# Patient Record
Sex: Male | Born: 1945 | Race: Black or African American | Hispanic: No | Marital: Single | Smoking: Current every day smoker
Health system: Southern US, Community
[De-identification: ages and names within clinical notes are randomized; demographics above are authoritative.]

## PROBLEM LIST (undated history)

## (undated) DIAGNOSIS — F209 Schizophrenia, unspecified: Secondary | ICD-10-CM

## (undated) DIAGNOSIS — J189 Pneumonia, unspecified organism: Secondary | ICD-10-CM

## (undated) DIAGNOSIS — F419 Anxiety disorder, unspecified: Secondary | ICD-10-CM

## (undated) DIAGNOSIS — I10 Essential (primary) hypertension: Secondary | ICD-10-CM

---

## 2015-01-18 ENCOUNTER — Ambulatory Visit (HOSPITAL_COMMUNITY)
Admission: RE | Admit: 2015-01-18 | Discharge: 2015-01-18 | Disposition: A | Discharge status: Home / Self Care | Payer: Medicare Other | Source: Ambulatory Visit | Attending: Internal Medicine | Admitting: Internal Medicine

## 2015-01-18 ENCOUNTER — Other Ambulatory Visit (HOSPITAL_COMMUNITY): Payer: Self-pay | Admitting: Internal Medicine

## 2015-01-18 DIAGNOSIS — R05 Cough: Secondary | ICD-10-CM | POA: Diagnosis not present

## 2015-01-18 DIAGNOSIS — R079 Chest pain, unspecified: Secondary | ICD-10-CM | POA: Insufficient documentation

## 2015-01-20 ENCOUNTER — Ambulatory Visit (HOSPITAL_COMMUNITY)
Admission: RE | Admit: 2015-01-20 | Discharge: 2015-01-20 | Disposition: A | Discharge status: Home / Self Care | Payer: Medicare Other | Source: Ambulatory Visit | Attending: Internal Medicine | Admitting: Internal Medicine

## 2015-01-20 ENCOUNTER — Other Ambulatory Visit (HOSPITAL_COMMUNITY): Payer: Self-pay | Admitting: Respiratory Therapy

## 2015-01-20 DIAGNOSIS — I10 Essential (primary) hypertension: Secondary | ICD-10-CM | POA: Diagnosis present

## 2015-02-01 ENCOUNTER — Emergency Department (HOSPITAL_COMMUNITY): Payer: Medicare Other

## 2015-02-01 ENCOUNTER — Inpatient Hospital Stay (HOSPITAL_COMMUNITY)
Admission: EM | Admit: 2015-02-01 | Discharge: 2015-02-11 | DRG: 193 | Disposition: A | Discharge status: Home Health | Payer: Medicare Other | Attending: Internal Medicine | Admitting: Internal Medicine

## 2015-02-01 ENCOUNTER — Encounter (HOSPITAL_COMMUNITY): Payer: Self-pay | Admitting: *Deleted

## 2015-02-01 DIAGNOSIS — I272 Other secondary pulmonary hypertension: Secondary | ICD-10-CM | POA: Diagnosis present

## 2015-02-01 DIAGNOSIS — J9622 Acute and chronic respiratory failure with hypercapnia: Secondary | ICD-10-CM | POA: Diagnosis not present

## 2015-02-01 DIAGNOSIS — R778 Other specified abnormalities of plasma proteins: Secondary | ICD-10-CM | POA: Diagnosis present

## 2015-02-01 DIAGNOSIS — Z7289 Other problems related to lifestyle: Secondary | ICD-10-CM | POA: Diagnosis not present

## 2015-02-01 DIAGNOSIS — E46 Unspecified protein-calorie malnutrition: Secondary | ICD-10-CM | POA: Diagnosis not present

## 2015-02-01 DIAGNOSIS — Z880 Allergy status to penicillin: Secondary | ICD-10-CM

## 2015-02-01 DIAGNOSIS — D649 Anemia, unspecified: Secondary | ICD-10-CM | POA: Diagnosis not present

## 2015-02-01 DIAGNOSIS — R7989 Other specified abnormal findings of blood chemistry: Secondary | ICD-10-CM | POA: Diagnosis not present

## 2015-02-01 DIAGNOSIS — T380X5A Adverse effect of glucocorticoids and synthetic analogues, initial encounter: Secondary | ICD-10-CM | POA: Diagnosis present

## 2015-02-01 DIAGNOSIS — I110 Hypertensive heart disease with heart failure: Secondary | ICD-10-CM | POA: Diagnosis not present

## 2015-02-01 DIAGNOSIS — J811 Chronic pulmonary edema: Secondary | ICD-10-CM

## 2015-02-01 DIAGNOSIS — Z720 Tobacco use: Secondary | ICD-10-CM | POA: Diagnosis not present

## 2015-02-01 DIAGNOSIS — I248 Other forms of acute ischemic heart disease: Secondary | ICD-10-CM | POA: Diagnosis present

## 2015-02-01 DIAGNOSIS — F17210 Nicotine dependence, cigarettes, uncomplicated: Secondary | ICD-10-CM | POA: Diagnosis not present

## 2015-02-01 DIAGNOSIS — J9602 Acute respiratory failure with hypercapnia: Secondary | ICD-10-CM | POA: Diagnosis present

## 2015-02-01 DIAGNOSIS — N179 Acute kidney failure, unspecified: Secondary | ICD-10-CM | POA: Diagnosis present

## 2015-02-01 DIAGNOSIS — J9601 Acute respiratory failure with hypoxia: Secondary | ICD-10-CM | POA: Diagnosis not present

## 2015-02-01 DIAGNOSIS — I252 Old myocardial infarction: Secondary | ICD-10-CM

## 2015-02-01 DIAGNOSIS — J810 Acute pulmonary edema: Secondary | ICD-10-CM | POA: Diagnosis present

## 2015-02-01 DIAGNOSIS — Z79899 Other long term (current) drug therapy: Secondary | ICD-10-CM

## 2015-02-01 DIAGNOSIS — E785 Hyperlipidemia, unspecified: Secondary | ICD-10-CM | POA: Diagnosis present

## 2015-02-01 DIAGNOSIS — N170 Acute kidney failure with tubular necrosis: Secondary | ICD-10-CM | POA: Diagnosis not present

## 2015-02-01 DIAGNOSIS — F411 Generalized anxiety disorder: Secondary | ICD-10-CM | POA: Diagnosis present

## 2015-02-01 DIAGNOSIS — I249 Acute ischemic heart disease, unspecified: Secondary | ICD-10-CM

## 2015-02-01 DIAGNOSIS — F1010 Alcohol abuse, uncomplicated: Secondary | ICD-10-CM | POA: Diagnosis not present

## 2015-02-01 DIAGNOSIS — E876 Hypokalemia: Secondary | ICD-10-CM | POA: Diagnosis not present

## 2015-02-01 DIAGNOSIS — Z86718 Personal history of other venous thrombosis and embolism: Secondary | ICD-10-CM | POA: Diagnosis not present

## 2015-02-01 DIAGNOSIS — I5032 Chronic diastolic (congestive) heart failure: Secondary | ICD-10-CM | POA: Diagnosis present

## 2015-02-01 DIAGNOSIS — I5033 Acute on chronic diastolic (congestive) heart failure: Secondary | ICD-10-CM | POA: Diagnosis not present

## 2015-02-01 DIAGNOSIS — J9690 Respiratory failure, unspecified, unspecified whether with hypoxia or hypercapnia: Secondary | ICD-10-CM

## 2015-02-01 DIAGNOSIS — J9621 Acute and chronic respiratory failure with hypoxia: Secondary | ICD-10-CM | POA: Diagnosis not present

## 2015-02-01 DIAGNOSIS — R0689 Other abnormalities of breathing: Secondary | ICD-10-CM | POA: Diagnosis not present

## 2015-02-01 DIAGNOSIS — J189 Pneumonia, unspecified organism: Secondary | ICD-10-CM | POA: Diagnosis not present

## 2015-02-01 DIAGNOSIS — F209 Schizophrenia, unspecified: Secondary | ICD-10-CM | POA: Diagnosis not present

## 2015-02-01 DIAGNOSIS — Z681 Body mass index (BMI) 19 or less, adult: Secondary | ICD-10-CM | POA: Diagnosis not present

## 2015-02-01 DIAGNOSIS — I10 Essential (primary) hypertension: Secondary | ICD-10-CM | POA: Diagnosis present

## 2015-02-01 DIAGNOSIS — R739 Hyperglycemia, unspecified: Secondary | ICD-10-CM | POA: Diagnosis not present

## 2015-02-01 DIAGNOSIS — Z23 Encounter for immunization: Secondary | ICD-10-CM

## 2015-02-01 DIAGNOSIS — J441 Chronic obstructive pulmonary disease with (acute) exacerbation: Secondary | ICD-10-CM | POA: Diagnosis present

## 2015-02-01 DIAGNOSIS — R0902 Hypoxemia: Secondary | ICD-10-CM

## 2015-02-01 HISTORY — DX: Anxiety disorder, unspecified: F41.9

## 2015-02-01 HISTORY — DX: Schizophrenia, unspecified: F20.9

## 2015-02-01 LAB — BLOOD GAS, ARTERIAL
ACID-BASE DEFICIT: 6 mmol/L — AB (ref 0.0–2.0)
Bicarbonate: 19.6 mEq/L — ABNORMAL LOW (ref 20.0–24.0)
DRAWN BY: 23534
O2 Content: 2 L/min
O2 SAT: 90.7 %
pCO2 arterial: 33.4 mmHg — ABNORMAL LOW (ref 35.0–45.0)
pH, Arterial: 7.361 (ref 7.350–7.450)
pO2, Arterial: 63.8 mmHg — ABNORMAL LOW (ref 80.0–100.0)

## 2015-02-01 LAB — CBC WITH DIFFERENTIAL/PLATELET
BASOS PCT: 0 %
Basophils Absolute: 0 10*3/uL (ref 0.0–0.1)
EOS ABS: 0 10*3/uL (ref 0.0–0.7)
EOS PCT: 0 %
HCT: 30.5 % — ABNORMAL LOW (ref 39.0–52.0)
Hemoglobin: 10.4 g/dL — ABNORMAL LOW (ref 13.0–17.0)
LYMPHS ABS: 0.4 10*3/uL — AB (ref 0.7–4.0)
Lymphocytes Relative: 3 %
MCH: 30.4 pg (ref 26.0–34.0)
MCHC: 34.1 g/dL (ref 30.0–36.0)
MCV: 89.2 fL (ref 78.0–100.0)
Monocytes Absolute: 0.6 10*3/uL (ref 0.1–1.0)
Monocytes Relative: 5 %
Neutro Abs: 11.5 10*3/uL — ABNORMAL HIGH (ref 1.7–7.7)
Neutrophils Relative %: 92 %
PLATELETS: 256 10*3/uL (ref 150–400)
RBC: 3.42 MIL/uL — AB (ref 4.22–5.81)
RDW: 12.5 % (ref 11.5–15.5)
WBC: 12.5 10*3/uL — AB (ref 4.0–10.5)

## 2015-02-01 LAB — COMPREHENSIVE METABOLIC PANEL
ALT: 15 U/L — AB (ref 17–63)
ANION GAP: 13 (ref 5–15)
AST: 48 U/L — ABNORMAL HIGH (ref 15–41)
Albumin: 3.2 g/dL — ABNORMAL LOW (ref 3.5–5.0)
Alkaline Phosphatase: 63 U/L (ref 38–126)
BUN: 47 mg/dL — ABNORMAL HIGH (ref 6–20)
CHLORIDE: 106 mmol/L (ref 101–111)
CO2: 19 mmol/L — AB (ref 22–32)
CREATININE: 2.28 mg/dL — AB (ref 0.61–1.24)
Calcium: 8.7 mg/dL — ABNORMAL LOW (ref 8.9–10.3)
GFR calc non Af Amer: 28 mL/min — ABNORMAL LOW (ref >60)
GFR, EST AFRICAN AMERICAN: 32 mL/min — AB (ref >60)
Glucose, Bld: 136 mg/dL — ABNORMAL HIGH (ref 65–99)
Potassium: 3.6 mmol/L (ref 3.5–5.1)
Sodium: 138 mmol/L (ref 135–145)
Total Bilirubin: 0.8 mg/dL (ref 0.3–1.2)
Total Protein: 6.9 g/dL (ref 6.5–8.1)

## 2015-02-01 LAB — BRAIN NATRIURETIC PEPTIDE: B NATRIURETIC PEPTIDE 5: 222 pg/mL — AB (ref 0.0–100.0)

## 2015-02-01 LAB — TROPONIN I
TROPONIN I: 0.06 ng/mL — AB (ref <0.031)
Troponin I: 0.07 ng/mL — ABNORMAL HIGH (ref <0.031)

## 2015-02-01 LAB — I-STAT CG4 LACTIC ACID, ED: LACTIC ACID, VENOUS: 1.37 mmol/L (ref 0.5–2.0)

## 2015-02-01 MED ORDER — AMANTADINE HCL 100 MG PO CAPS
100.0000 mg | ORAL_CAPSULE | Freq: Three times a day (TID) | ORAL | Status: DC
  Administered 2015-02-01 – 2015-02-02 (×4): 100 mg via ORAL
  Filled 2015-02-01 (×11): qty 1

## 2015-02-01 MED ORDER — METHYLPREDNISOLONE SODIUM SUCC 125 MG IJ SOLR
60.0000 mg | Freq: Two times a day (BID) | INTRAMUSCULAR | Status: DC
  Administered 2015-02-02 – 2015-02-03 (×3): 60 mg via INTRAVENOUS
  Filled 2015-02-01 (×3): qty 2

## 2015-02-01 MED ORDER — DEXTROSE 5 % IV SOLN
1.0000 g | INTRAVENOUS | Status: DC
  Administered 2015-02-01 – 2015-02-02 (×2): 1 g via INTRAVENOUS
  Filled 2015-02-01 (×2): qty 10

## 2015-02-01 MED ORDER — SODIUM CHLORIDE 0.9 % IV BOLUS (SEPSIS)
1000.0000 mL | Freq: Once | INTRAVENOUS | Status: AC
  Administered 2015-02-01: 1000 mL via INTRAVENOUS

## 2015-02-01 MED ORDER — SODIUM CHLORIDE 0.9 % IV SOLN: INTRAVENOUS | Status: AC

## 2015-02-01 MED ORDER — ALBUTEROL (5 MG/ML) CONTINUOUS INHALATION SOLN
10.0000 mg/h | INHALATION_SOLUTION | Freq: Once | RESPIRATORY_TRACT | Status: AC
  Administered 2015-02-01: 10 mg/h via RESPIRATORY_TRACT
  Filled 2015-02-01: qty 20

## 2015-02-01 MED ORDER — DEXTROSE 5 % IV SOLN
500.0000 mg | INTRAVENOUS | Status: DC
  Administered 2015-02-01 – 2015-02-02 (×2): 500 mg via INTRAVENOUS
  Filled 2015-02-01 (×2): qty 500

## 2015-02-01 MED ORDER — ATORVASTATIN CALCIUM 10 MG PO TABS
10.0000 mg | ORAL_TABLET | Freq: Every day | ORAL | Status: DC
  Administered 2015-02-02: 10 mg via ORAL
  Filled 2015-02-01 (×2): qty 1

## 2015-02-01 MED ORDER — IPRATROPIUM-ALBUTEROL 0.5-2.5 (3) MG/3ML IN SOLN
3.0000 mL | Freq: Four times a day (QID) | RESPIRATORY_TRACT | Status: DC
  Administered 2015-02-01 – 2015-02-03 (×7): 3 mL via RESPIRATORY_TRACT
  Filled 2015-02-01 (×7): qty 3

## 2015-02-01 MED ORDER — ENOXAPARIN SODIUM 30 MG/0.3ML ~~LOC~~ SOLN
30.0000 mg | SUBCUTANEOUS | Status: DC
  Administered 2015-02-01 – 2015-02-02 (×2): 30 mg via SUBCUTANEOUS
  Filled 2015-02-01 (×3): qty 0.3

## 2015-02-01 MED ORDER — AMANTADINE HCL 100 MG PO CAPS
ORAL_CAPSULE | ORAL | Status: AC
  Filled 2015-02-01: qty 1

## 2015-02-01 MED ORDER — METHYLPREDNISOLONE SODIUM SUCC 125 MG IJ SOLR
125.0000 mg | Freq: Once | INTRAMUSCULAR | Status: AC
  Administered 2015-02-01: 125 mg via INTRAVENOUS
  Filled 2015-02-01: qty 2

## 2015-02-01 MED ORDER — ALBUTEROL SULFATE (2.5 MG/3ML) 0.083% IN NEBU
2.5000 mg | INHALATION_SOLUTION | RESPIRATORY_TRACT | Status: DC | PRN
  Administered 2015-02-02: 2.5 mg via RESPIRATORY_TRACT
  Filled 2015-02-01: qty 3

## 2015-02-01 NOTE — ED Notes (Signed)
Attempted to call report, RN unavailable at this time.

## 2015-02-01 NOTE — ED Notes (Signed)
MD at bedside. 

## 2015-02-01 NOTE — H&P (Signed)
History and Physical  Jared Austin AOZ:[id number] DOB: 02/08/1946 DOA: 02/01/2015  Referring physician: Dr Blinda Leatherwood, ED physician PCP: Avon Gully, MD   Chief Complaint: Shortness of breath  HPI: Jared Austin is a 69 y.o. male  With a history of schizophrenia on amantadine and invega. The patient presents to the hospital with shortness of breath that a been increasing over the past 4-6 weeks and acutely worse over the past 48 hours. Shortness of breath worse with ambulation and improved with rest. Patient denies fevers, chills, decreased appetite, cough - although history is somewhat difficult to obtain due to patient's psych history. Per the ER physician, chest sounds were tight with the patient first presented to the hospital and was hypoxic to 86%.    Review of Systems:   Pt denies any fevers, chills, nausea, vomiting, diarrhea, constipation, abdominal pain, orthopnea, cough, wheezing, palpitations, headache, vision changes, lightheadedness, dizziness, diarrhea, constipation, melena, rectal bleeding.  Review of systems are otherwise negative  Past Medical History  Diagnosis Date  . Anxiety   . Schizophrenia (HCC)    History reviewed. No pertinent past surgical history. Social History:  reports that he has been smoking Cigarettes.  He has a 93 pack-year smoking history. He does not have any smokeless tobacco history on file. He reports that he does not drink alcohol or use illicit drugs. Patient lives at Home & is able to participate in activities of daily living with assistance   Allergies  Allergen Reactions  . Penicillins Anxiety    Patient unaware of family history   Prior to Admission medications   Medication Sig Start Date End Date Taking? Authorizing Provider  amantadine (SYMMETREL) 100 MG capsule Take 100 mg by mouth 3 (three) times daily. 01/21/15  Yes Historical Provider, MD  Aspirin-Salicylamide-Caffeine (BC HEADACHE POWDER PO) Take 1 packet by mouth every 6 (six)  hours as needed (Pain).   Yes Historical Provider, MD  atorvastatin (LIPITOR) 10 MG tablet Take 10 mg by mouth daily. 01/08/15  Yes Historical Provider, MD  INVEGA SUSTENNA 156 MG/ML SUSP injection Inject 156 mg as directed every 6 (six) weeks.  01/21/15  Yes Historical Provider, MD    Physical Exam: BP 149/86 mmHg  Pulse 106  Temp(Src) 98.6 F (37 C) (Oral)  Resp 24  Ht  (1.702 m)  Wt 59.421 kg (131 lb)  BMI 20.51 kg/m2  SpO2 94%  General: older black male. Awake and alert and oriented x3. No acute cardiopulmonary distress.  Eyes: Pupils equal, round, reactive to light. Extraocular muscles are intact. Sclerae anicteric and noninjected.  ENT:  Moist mucosal membranes. No mucosal lesions.   Neck: Neck supple without lymphadenopathy. No carotid bruits. No masses palpated.  Cardiovascular: Regular rate with normal S1-S2 sounds. No murmurs, rubs, gallops auscultated. No JVD.  Respiratory: prolonged expiration phase. Mild wheezes noted. Rales noted in the left base  Abdomen: Soft, nontender, nondistended. Active bowel sounds. No masses or hepatosplenomegaly  Skin: Dry, warm to touch. 2+ dorsalis pedis and radial pulses. Musculoskeletal: No calf or leg pain. All major joints not erythematous nontender.  Psychiatric: Intact judgment and insight.  Neurologic: No focal neurological deficits. Cranial nerves II through XII are grossly intact.           Labs on Admission:  Basic Metabolic Panel:  Recent Labs Lab 02/01/15 1540  NA 138  K 3.6  CL 106  CO2 19*  GLUCOSE 136*  BUN 47*  CREATININE 2.28*  CALCIUM 8.7*   Liver Function Tests:  Recent  Labs Lab 02/01/15 1540  AST 48*  ALT 15*  ALKPHOS 63  BILITOT 0.8  PROT 6.9  ALBUMIN 3.2*   No results for input(s): LIPASE, AMYLASE in the last 168 hours. No results for input(s): AMMONIA in the last 168 hours. CBC:  Recent Labs Lab 02/01/15 1540  WBC 12.5*  NEUTROABS 11.5*  HGB 10.4*  HCT 30.5*  MCV 89.2  PLT 256    Cardiac Enzymes:  Recent Labs Lab 02/01/15 1540  TROPONINI 0.07*    BNP (last 3 results)  Recent Labs  02/01/15 1530  BNP 222.0*    ProBNP (last 3 results) No results for input(s): PROBNP in the last 8760 hours.  CBG: No results for input(s): GLUCAP in the last 168 hours.  Radiological Exams on Admission: Dg Chest 2 View  02/01/2015   CLINICAL DATA:  Cough and short of breath  EXAM: CHEST  2 VIEW  COMPARISON:  01/18/2015  FINDINGS: Interval development of prominent reticulonodular densities throughout both lungs. No effusion. No lobar infiltrate. Mild hyperinflation of the lungs  Heart size is normal.  Vascularity normal.  IMPRESSION: Interval development of reticular nodular markings in the lungs right greater than left. This may be due to acute infection or inhalational lung disease. Consider virus or mycoplasma pneumonia.   Electronically Signed   By: Marlan Palau M.D.   On: 02/01/2015 16:07    EKG: Independently reviewed.  Sinus tachycardia with ventricular rate of 103. normal intervals. Possible left ventricular hypertrophy. Right atrial hypertrophy. No acute STEMI  Assessment/Plan Present on Admission:  . CAP (community acquired pneumonia) . Acute respiratory failure with hypoxia (HCC) . Elevated troponin . Acute kidney failure [facility])  This patient was discussed with the ED physician, including pertinent vitals, physical exam findings, labs, and imaging.  We also discussed care given by the ED provider.   #1 acute respiratory failure with hypoxia  admit   improved with nasal cannula   Continue oxygen therapy #2 community-acquired acquired pneumonia    possibly atypical   We'll obtain HIV, Legionella, strep antigens new    Continue ceftriaxone and azithromycin   obtain CBC in the morning #3  Acute kidney failure   we'll give 1 L fluid bolus in the emergency department over 4 hours,  Then continue is 125 miles per hour for 10 hours   recheck  creatinine in the morning  #4 elevated troponin   repeat troponin 3 hours from last. If continues to be elevated, we'll continue to trend. This may simply be from acute kidney failure  DVT prophylaxis:  Lovenox  Consultants:  none  Code Status:  Full code  Family Communication:  none   Disposition Plan:  admission   Levie Heritage, DO [facility] Pager [phone]

## 2015-02-01 NOTE — ED Notes (Signed)
Pt states has been having trouble breathing over the past 2 months and has been to his primary for these issues but he got more SOB today and comes in for evaluation. NAD noted.

## 2015-02-01 NOTE — ED Provider Notes (Signed)
CSN: [id number]     Arrival date & time 02/01/15  1501 History   First MD Initiated Contact with Patient 02/01/15 1520     Chief Complaint  Patient presents with  . Shortness of Breath     (Consider location/radiation/quality/duration/timing/severity/associated sxs/prior Treatment) HPI Comments: Patient presents to the emergency department for evaluation of difficulty breathing. Patient reports that he has been having difficulty breathing for approximately 2 months. He did see his doctor last week and had blood work and x-rays, but did not hear back yet. Patient reports that his breathing is worse. Family also reports that he seems to be more confused than usual. Patient not expressing any chest pain associated with the symptoms. He has not had any fever. There is a cough productive of thick sputum. He is a heavy smoker. He does not use oxygen or albuterol at home.  Patient is a 69 y.o. male presenting with shortness of breath.  Shortness of Breath Associated symptoms: cough     Past Medical History  Diagnosis Date  . Anxiety   . Schizophrenia (HCC)    History reviewed. No pertinent past surgical history. No family history on file. Social History  Substance Use Topics  . Smoking status: Current Every Day Smoker -- 1.50 packs/day for 62 years    Types: Cigarettes  . Smokeless tobacco: None  . Alcohol Use: No    Review of Systems  Respiratory: Positive for cough and shortness of breath.   Psychiatric/Behavioral: Positive for confusion.  All other systems reviewed and are negative.     Allergies  Penicillins  Home Medications   Prior to Admission medications   Medication Sig Start Date End Date Taking? Authorizing Provider  amantadine (SYMMETREL) 100 MG capsule Take 100 mg by mouth 3 (three) times daily. 01/21/15  Yes Historical Provider, MD  Aspirin-Salicylamide-Caffeine (BC HEADACHE POWDER PO) Take 1 packet by mouth every 6 (six) hours as needed (Pain).   Yes Historical  Provider, MD  atorvastatin (LIPITOR) 10 MG tablet Take 10 mg by mouth daily. 01/08/15  Yes Historical Provider, MD  INVEGA SUSTENNA 156 MG/ML SUSP injection Inject 156 mg as directed every 6 (six) weeks.  01/21/15  Yes Historical Provider, MD   BP 144/81 mmHg  Pulse 101  Temp(Src) 98.7 F (37.1 C) (Oral)  Resp 26  Ht  (1.702 m)  Wt 131 lb (59.421 kg)  BMI 20.51 kg/m2  SpO2 93% Physical Exam  Constitutional: He is oriented to person, place, and time. He appears well-developed and well-nourished. No distress.  HENT:  Head: Normocephalic and atraumatic.  Right Ear: Hearing normal.  Left Ear: Hearing normal.  Nose: Nose normal.  Mouth/Throat: Oropharynx is clear and moist and mucous membranes are normal.  Eyes: Conjunctivae and EOM are normal. Pupils are equal, round, and reactive to light.  Neck: Normal range of motion. Neck supple.  Cardiovascular: Regular rhythm, S1 normal and S2 normal.  Tachycardia present.  Exam reveals no gallop and no friction rub.   No murmur heard. Pulmonary/Chest: Effort normal. No respiratory distress. He has decreased breath sounds in the right middle field, the right lower field, the left middle field and the left lower field. He exhibits no tenderness.  Abdominal: Soft. Normal appearance and bowel sounds are normal. There is no hepatosplenomegaly. There is no tenderness. There is no rebound, no guarding, no tenderness at McBurney's point and negative Murphy's sign. No hernia.  Musculoskeletal: Normal range of motion.  Neurological: He is alert and oriented to person,  place, and time. He has normal strength. No cranial nerve deficit or sensory deficit. Coordination normal. GCS eye subscore is 4. GCS verbal subscore is 5. GCS motor subscore is 6.  Skin: Skin is warm, dry and intact. No rash noted. No cyanosis.  Psychiatric: He has a normal mood and affect. His speech is normal and behavior is normal. Thought content normal.  Nursing note and vitals  reviewed.   ED Course  Procedures (including critical care time) Labs Review Labs Reviewed  CBC WITH DIFFERENTIAL/PLATELET - Abnormal; Notable for the following:    WBC 12.5 (*)    RBC 3.42 (*)    Hemoglobin 10.4 (*)    HCT 30.5 (*)    Neutro Abs 11.5 (*)    Lymphs Abs 0.4 (*)    All other components within normal limits  COMPREHENSIVE METABOLIC PANEL - Abnormal; Notable for the following:    CO2 19 (*)    Glucose, Bld 136 (*)    BUN 47 (*)    Creatinine, Ser 2.28 (*)    Calcium 8.7 (*)    Albumin 3.2 (*)    AST 48 (*)    ALT 15 (*)    GFR calc non Af Amer 28 (*)    GFR calc Af Amer 32 (*)    All other components within normal limits  TROPONIN I - Abnormal; Notable for the following:    Troponin I 0.07 (*)    All other components within normal limits  BRAIN NATRIURETIC PEPTIDE - Abnormal; Notable for the following:    B Natriuretic Peptide 222.0 (*)    All other components within normal limits  BLOOD GAS, ARTERIAL - Abnormal; Notable for the following:    pCO2 arterial 33.4 (*)    pO2, Arterial 63.8 (*)    Bicarbonate 19.6 (*)    Acid-base deficit 6.0 (*)    All other components within normal limits  I-STAT CG4 LACTIC ACID, ED    Imaging Review Dg Chest 2 View  02/01/2015   CLINICAL DATA:  Cough and short of breath  EXAM: CHEST  2 VIEW  COMPARISON:  01/18/2015  FINDINGS: Interval development of prominent reticulonodular densities throughout both lungs. No effusion. No lobar infiltrate. Mild hyperinflation of the lungs  Heart size is normal.  Vascularity normal.  IMPRESSION: Interval development of reticular nodular markings in the lungs right greater than left. This may be due to acute infection or inhalational lung disease. Consider virus or mycoplasma pneumonia.   Electronically Signed   By: Marlan Palau M.D.   On: 02/01/2015 16:07   I have personally reviewed and evaluated these images and lab results as part of my medical decision-making.   EKG  Interpretation   Date/Time:  Saturday February 01 2015 15:16:55 EDT Ventricular Rate:  103 PR Interval:  151 QRS Duration: 88 QT Interval:  374 QTC Calculation: 490 R Axis:   70 Text Interpretation:  Sinus tachycardia Right atrial enlargement Consider  left ventricular hypertrophy Borderline prolonged QT interval Baseline  wander in lead(s) V2 No significant change since last tracing Confirmed by  POLLINA  MD, CHRISTOPHER [phone]) on 02/01/2015 3:21:05 PM      MDM   Final diagnoses:  CAP (community acquired pneumonia)    Patient presents to the ER for evaluation of shortness of breath. Patient reports that he has been experiencing shortness of breath the last couple of months, but symptoms have worsened in the last couple of days. They report that he does have some  increased confusion, but he does have a history of schizophrenia. Patient is alert and in no distress here in the ER. Patient has a long smoking history, is not currently treated for COPD.  Workup shows evidence of pneumonia, possibly atypical such as mycoplasma pneumonia. Patient was hypoxic at arrival. He will require hospitalization for further treatment. Blood gas does not show any CO2 retention. Patient initiated on Rocephin and Zithromax for community-acquired pneumonia and atypical coverage.    Gilda Crease, MD 02/01/15 1650

## 2015-02-01 NOTE — ED Notes (Signed)
Pt c/o SOB over the last couple months. Pt was seen by Dr. Felecia Shelling earlier this week and had xrays and blood work done. Pt and family have not heard any results from Dr. Felecia Shelling yet. Pt's brother and niece at bedside reporting pt has been having increased difficulty breathing recently. Pt seems slightly confused, very talkative, not always able to carry on an understandable conversation. Pt alert and oriented to self and place, but disoriented to time. Pt's niece and brother report pt has hx of schizophrenia and is always slightly confused and anxious, but over the last 4 weeks it has been much more than normal. Pt lives at home with brother.

## 2015-02-01 NOTE — ED Notes (Signed)
Hospitalist at bedside 

## 2015-02-02 LAB — CBC
HEMATOCRIT: 30.9 % — AB (ref 39.0–52.0)
Hemoglobin: 10.5 g/dL — ABNORMAL LOW (ref 13.0–17.0)
MCH: 30.4 pg (ref 26.0–34.0)
MCHC: 34 g/dL (ref 30.0–36.0)
MCV: 89.6 fL (ref 78.0–100.0)
PLATELETS: 226 10*3/uL (ref 150–400)
RBC: 3.45 MIL/uL — ABNORMAL LOW (ref 4.22–5.81)
RDW: 12.7 % (ref 11.5–15.5)
WBC: 10.8 10*3/uL — ABNORMAL HIGH (ref 4.0–10.5)

## 2015-02-02 LAB — BASIC METABOLIC PANEL
ANION GAP: 11 (ref 5–15)
BUN: 34 mg/dL — AB (ref 6–20)
CALCIUM: 8.9 mg/dL (ref 8.9–10.3)
CO2: 20 mmol/L — AB (ref 22–32)
Chloride: 108 mmol/L (ref 101–111)
Creatinine, Ser: 1.8 mg/dL — ABNORMAL HIGH (ref 0.61–1.24)
GFR calc Af Amer: 43 mL/min — ABNORMAL LOW (ref >60)
GFR, EST NON AFRICAN AMERICAN: 37 mL/min — AB (ref >60)
GLUCOSE: 144 mg/dL — AB (ref 65–99)
Potassium: 3.1 mmol/L — ABNORMAL LOW (ref 3.5–5.1)
Sodium: 139 mmol/L (ref 135–145)

## 2015-02-02 MED ORDER — NICOTINE 21 MG/24HR TD PT24
21.0000 mg | MEDICATED_PATCH | Freq: Every day | TRANSDERMAL | Status: DC
  Administered 2015-02-02 – 2015-02-03 (×2): 21 mg via TRANSDERMAL
  Filled 2015-02-02 (×2): qty 1

## 2015-02-02 MED ORDER — ACETAMINOPHEN 325 MG PO TABS
650.0000 mg | ORAL_TABLET | Freq: Four times a day (QID) | ORAL | Status: DC | PRN
  Administered 2015-02-02: 650 mg via ORAL
  Filled 2015-02-02: qty 2

## 2015-02-02 NOTE — Progress Notes (Signed)
Patient beginning to hear voices. He is Schizophrenic

## 2015-02-02 NOTE — Progress Notes (Signed)
ANTIBIOTIC CONSULT NOTE - INITIAL  Pharmacy Consult for Renal Adjustment Antibiotics (Azithromycin/Ceftriaxone) Indication: pneumonia  Allergies  Allergen Reactions  . Penicillins Anxiety    Patient Measurements: Height:  (170.2 cm) Weight: 114 lb 9.6 oz (51.982 kg) IBW/kg (Calculated) : 66.1 Adjusted Body Weight:   Vital Signs: Temp: 99.4 F (37.4 C) (10/02 0500) Temp Source: Oral (10/02 0500) BP: 160/90 mmHg (10/02 0600) Pulse Rate: 108 (10/02 0500) Intake/Output from previous day: 10/01 0701 - 10/02 0700 In: 1000 [IV Piggyback:1000] Out: -  Intake/Output from this shift:    Labs:  Recent Labs  02/01/15 1540 02/02/15 0608  WBC 12.5* 10.8*  HGB 10.4* 10.5*  PLT 256 226  CREATININE 2.28* 1.80*   Estimated Creatinine Clearance: 28.9 mL/min (by C-G formula based on Cr of 1.8). No results for input(s): VANCOTROUGH, VANCOPEAK, VANCORANDOM, GENTTROUGH, GENTPEAK, GENTRANDOM, TOBRATROUGH, TOBRAPEAK, TOBRARND, AMIKACINPEAK, AMIKACINTROU, AMIKACIN in the last 72 hours.   Microbiology: No results found for this or any previous visit (from the past 720 hour(s)).  Medical History: Past Medical History  Diagnosis Date  . Anxiety   . Schizophrenia (HCC)     Medications:  Scheduled:  . amantadine  100 mg Oral TID  . atorvastatin  10 mg Oral q1800  . azithromycin  500 mg Intravenous Q24H  . cefTRIAXone (ROCEPHIN)  IV  1 g Intravenous Q24H  . enoxaparin (LOVENOX) injection  30 mg Subcutaneous Q24H  . ipratropium-albuterol  3 mL Nebulization Q6H  . methylPREDNISolone (SOLU-MEDROL) injection  60 mg Intravenous Q12H   Assessment: Community acquired pneumonia Azithromycin 500 mg IV and Ceftriaxone 1 GM IV started  Goal of Therapy:  Eradicate infection  Plan:  Continue Azithromycin 500 mg IV every 24 hours Continue Ceftriaxone 1 GM IV every 24 hours No renal adjustment necessary F/U Azithromycin to oral therapy when appropriate  Mujtaba Bollig  Bennett 02/02/2015,8:46 AM

## 2015-02-02 NOTE — Progress Notes (Signed)
Called to patient's room by RN due to low oxygen level.  Patient had been on 3-4 lpm most of the day, saturation was 84% with slight SOB.  Gave patient albuterol PRN TX and placed patient on 50% venturi mask, saturation now is 98%.  Patient is doing a lot of mouth breathing.  Will continue to monitor.

## 2015-02-02 NOTE — Progress Notes (Signed)
Was informed patient was a 3 ppd smoker.  Received verbal order from Dr. Karilyn Cota for  nicotine patch.

## 2015-02-02 NOTE — Progress Notes (Signed)
NT notified RN that patient's O2 sats were 84% 3L [place] during vital sign assessment.  Patient's O2 bumped up to 4L, scheduled solumedrol administered, and HOB raised.  O2 sats came up to 89-90%.  RT notified.  RT stated she would come see patient.  WIll continue to monitor patient.

## 2015-02-03 ENCOUNTER — Inpatient Hospital Stay (HOSPITAL_COMMUNITY): Payer: Medicare Other

## 2015-02-03 ENCOUNTER — Inpatient Hospital Stay (HOSPITAL_COMMUNITY): Payer: Medicare Other | Admitting: Anesthesiology

## 2015-02-03 DIAGNOSIS — J9601 Acute respiratory failure with hypoxia: Secondary | ICD-10-CM

## 2015-02-03 DIAGNOSIS — J189 Pneumonia, unspecified organism: Principal | ICD-10-CM

## 2015-02-03 LAB — BLOOD GAS, ARTERIAL
ACID-BASE DEFICIT: 1.2 mmol/L (ref 0.0–2.0)
Acid-base deficit: 3.1 mmol/L — ABNORMAL HIGH (ref 0.0–2.0)
Acid-base deficit: 2 mmol/L (ref 0.0–2.0)
Bicarbonate: 21.9 mEq/L (ref 20.0–24.0)
Bicarbonate: 23.3 mEq/L (ref 20.0–24.0)
Bicarbonate: 22.2 mEq/L (ref 20.0–24.0)
DRAWN BY: 23534
DRAWN BY: 23534
Drawn by: 105551
FIO2: 100
FIO2: 0.9
LHR: 22 {breaths}/min
MECHVT: 410 mL
O2 CONTENT: 90 L/min
O2 Content: 100 L/min
O2 Saturation: 94.9 %
O2 Saturation: 90.2 %
O2 Saturation: 99.7 %
PATIENT TEMPERATURE: 98.6
PCO2 ART: 36.5 mmHg (ref 35.0–45.0)
PCO2 ART: 52.6 mmHg — AB (ref 35.0–45.0)
PEEP/CPAP: 14 cmH2O
PH ART: 7.38 (ref 7.350–7.450)
pCO2 arterial: 40.1 mmHg (ref 35.0–45.0)
pH, Arterial: 7.38 (ref 7.350–7.450)
pH, Arterial: 7.278 — ABNORMAL LOW (ref 7.350–7.450)
pO2, Arterial: 81.7 mmHg (ref 80.0–100.0)
pO2, Arterial: 64.3 mmHg — ABNORMAL LOW (ref 80.0–100.0)
pO2, Arterial: 367 mmHg — ABNORMAL HIGH (ref 80.0–100.0)

## 2015-02-03 LAB — MRSA PCR SCREENING
MRSA BY PCR: NEGATIVE
MRSA by PCR: NEGATIVE

## 2015-02-03 LAB — COMPREHENSIVE METABOLIC PANEL
ALBUMIN: 3 g/dL — AB (ref 3.5–5.0)
ALT: 18 U/L (ref 17–63)
ANION GAP: 10 (ref 5–15)
AST: 34 U/L (ref 15–41)
Alkaline Phosphatase: 75 U/L (ref 38–126)
BILIRUBIN TOTAL: 0.7 mg/dL (ref 0.3–1.2)
BUN: 41 mg/dL — ABNORMAL HIGH (ref 6–20)
CHLORIDE: 106 mmol/L (ref 101–111)
CO2: 22 mmol/L (ref 22–32)
Calcium: 9.1 mg/dL (ref 8.9–10.3)
Creatinine, Ser: 2 mg/dL — ABNORMAL HIGH (ref 0.61–1.24)
GFR calc Af Amer: 38 mL/min — ABNORMAL LOW (ref >60)
GFR calc non Af Amer: 33 mL/min — ABNORMAL LOW (ref >60)
GLUCOSE: 159 mg/dL — AB (ref 65–99)
POTASSIUM: 3.4 mmol/L — AB (ref 3.5–5.1)
SODIUM: 138 mmol/L (ref 135–145)
TOTAL PROTEIN: 6.9 g/dL (ref 6.5–8.1)

## 2015-02-03 LAB — CBC WITH DIFFERENTIAL/PLATELET
BASOS ABS: 0 10*3/uL (ref 0.0–0.1)
BASOS PCT: 0 %
EOS ABS: 0 10*3/uL (ref 0.0–0.7)
EOS PCT: 0 %
HCT: 34 % — ABNORMAL LOW (ref 39.0–52.0)
Hemoglobin: 11.6 g/dL — ABNORMAL LOW (ref 13.0–17.0)
LYMPHS ABS: 0.5 10*3/uL — AB (ref 0.7–4.0)
Lymphocytes Relative: 3 %
MCH: 30.9 pg (ref 26.0–34.0)
MCHC: 34.1 g/dL (ref 30.0–36.0)
MCV: 90.7 fL (ref 78.0–100.0)
MONOS PCT: 2 %
Monocytes Absolute: 0.4 10*3/uL (ref 0.1–1.0)
NEUTROS ABS: 15.8 10*3/uL — AB (ref 1.7–7.7)
NEUTROS PCT: 95 %
PLATELETS: 228 10*3/uL (ref 150–400)
RBC: 3.75 MIL/uL — AB (ref 4.22–5.81)
RDW: 13.1 % (ref 11.5–15.5)
WBC: 16.7 10*3/uL — ABNORMAL HIGH (ref 4.0–10.5)

## 2015-02-03 LAB — PHOSPHORUS: Phosphorus: 5.5 mg/dL — ABNORMAL HIGH (ref 2.5–4.6)

## 2015-02-03 LAB — POCT I-STAT 3, ART BLOOD GAS (G3+)
ACID-BASE EXCESS: 1 mmol/L (ref 0.0–2.0)
Bicarbonate: 26.1 mEq/L — ABNORMAL HIGH (ref 20.0–24.0)
O2 Saturation: 98 %
PH ART: 7.375 (ref 7.350–7.450)
PO2 ART: 111 mmHg — AB (ref 80.0–100.0)
TCO2: 27 mmol/L (ref 0–100)
pCO2 arterial: 44.6 mmHg (ref 35.0–45.0)

## 2015-02-03 LAB — MAGNESIUM: MAGNESIUM: 2.4 mg/dL (ref 1.7–2.4)

## 2015-02-03 LAB — TROPONIN I: Troponin I: 0.2 ng/mL — ABNORMAL HIGH (ref <0.031)

## 2015-02-03 LAB — STREP PNEUMONIAE URINARY ANTIGEN: Strep Pneumo Urinary Antigen: NEGATIVE

## 2015-02-03 LAB — AMMONIA: Ammonia: 29 umol/L (ref 9–35)

## 2015-02-03 LAB — BRAIN NATRIURETIC PEPTIDE: B Natriuretic Peptide: 602 pg/mL — ABNORMAL HIGH (ref 0.0–100.0)

## 2015-02-03 LAB — GLUCOSE, CAPILLARY: GLUCOSE-CAPILLARY: 152 mg/dL — AB (ref 65–99)

## 2015-02-03 MED ORDER — HALOPERIDOL LACTATE 5 MG/ML IJ SOLN
1.0000 mg | INTRAMUSCULAR | Status: DC | PRN
  Administered 2015-02-03 (×2): 2 mg via INTRAVENOUS
  Filled 2015-02-03 (×2): qty 1

## 2015-02-03 MED ORDER — FENTANYL CITRATE (PF) 100 MCG/2ML IJ SOLN
50.0000 ug | INTRAMUSCULAR | Status: DC | PRN
  Administered 2015-02-03 (×2): 50 ug via INTRAVENOUS
  Filled 2015-02-03 (×2): qty 2

## 2015-02-03 MED ORDER — SUCCINYLCHOLINE CHLORIDE 20 MG/ML IJ SOLN
INTRAMUSCULAR | Status: AC
  Administered 2015-02-03: 80 mg via INTRAVENOUS
  Filled 2015-02-03: qty 1

## 2015-02-03 MED ORDER — VANCOMYCIN HCL IN DEXTROSE 1-5 GM/200ML-% IV SOLN
1000.0000 mg | Freq: Once | INTRAVENOUS | Status: AC
  Administered 2015-02-03: 1000 mg via INTRAVENOUS
  Filled 2015-02-03: qty 200

## 2015-02-03 MED ORDER — ANTISEPTIC ORAL RINSE SOLUTION (CORINZ)
7.0000 mL | OROMUCOSAL | Status: DC
  Administered 2015-02-03 (×2): 7 mL via OROMUCOSAL

## 2015-02-03 MED ORDER — DEXTROSE 5 % IV SOLN
INTRAVENOUS | Status: DC
  Administered 2015-02-03: 10:00:00 via INTRAVENOUS

## 2015-02-03 MED ORDER — ROCURONIUM BROMIDE 50 MG/5ML IV SOLN
INTRAVENOUS | Status: AC
  Filled 2015-02-03: qty 2

## 2015-02-03 MED ORDER — ACETAMINOPHEN 160 MG/5ML PO SOLN: 650.0000 mg | Freq: Four times a day (QID) | ORAL | Status: DC | PRN

## 2015-02-03 MED ORDER — FENTANYL CITRATE (PF) 100 MCG/2ML IJ SOLN
50.0000 ug | INTRAMUSCULAR | Status: DC | PRN
  Filled 2015-02-03: qty 2

## 2015-02-03 MED ORDER — ETOMIDATE 2 MG/ML IV SOLN
INTRAVENOUS | Status: AC
  Administered 2015-02-03: 14 mg via INTRAVENOUS
  Filled 2015-02-03: qty 20

## 2015-02-03 MED ORDER — IPRATROPIUM-ALBUTEROL 0.5-2.5 (3) MG/3ML IN SOLN
3.0000 mL | Freq: Four times a day (QID) | RESPIRATORY_TRACT | Status: DC
  Administered 2015-02-03 – 2015-02-05 (×9): 3 mL via RESPIRATORY_TRACT
  Filled 2015-02-03 (×9): qty 3

## 2015-02-03 MED ORDER — SODIUM CHLORIDE 0.9 % IV SOLN
INTRAVENOUS | Status: DC
  Administered 2015-02-03 – 2015-02-10 (×3): via INTRAVENOUS

## 2015-02-03 MED ORDER — LORAZEPAM 1 MG PO TABS: 1.0000 mg | ORAL_TABLET | Freq: Four times a day (QID) | ORAL | Status: DC | PRN

## 2015-02-03 MED ORDER — PANTOPRAZOLE SODIUM 40 MG PO PACK
40.0000 mg | PACK | ORAL | Status: DC
  Filled 2015-02-03: qty 20

## 2015-02-03 MED ORDER — DEXMEDETOMIDINE HCL IN NACL 200 MCG/50ML IV SOLN
0.0000 ug/kg/h | INTRAVENOUS | Status: DC
Start: 2015-02-03 — End: 2015-02-03
  Administered 2015-02-03: 0.6 ug/kg/h via INTRAVENOUS

## 2015-02-03 MED ORDER — VITAL HIGH PROTEIN PO LIQD
1000.0000 mL | ORAL | Status: DC
  Administered 2015-02-03: 23:00:00
  Administered 2015-02-03: 1000 mL
  Filled 2015-02-03 (×3): qty 1000

## 2015-02-03 MED ORDER — MORPHINE SULFATE (PF) 2 MG/ML IV SOLN: 2.0000 mg | INTRAVENOUS | Status: DC | PRN

## 2015-02-03 MED ORDER — ANTISEPTIC ORAL RINSE SOLUTION (CORINZ)
7.0000 mL | Freq: Four times a day (QID) | OROMUCOSAL | Status: DC
  Administered 2015-02-03: 7 mL via OROMUCOSAL

## 2015-02-03 MED ORDER — METHYLPREDNISOLONE SODIUM SUCC 40 MG IJ SOLR
40.0000 mg | Freq: Three times a day (TID) | INTRAMUSCULAR | Status: DC
  Administered 2015-02-03 – 2015-02-04 (×3): 40 mg via INTRAVENOUS
  Filled 2015-02-03 (×5): qty 1

## 2015-02-03 MED ORDER — CHLORHEXIDINE GLUCONATE 0.12% ORAL RINSE (MEDLINE KIT)
15.0000 mL | Freq: Two times a day (BID) | OROMUCOSAL | Status: DC
  Administered 2015-02-03: 15 mL via OROMUCOSAL

## 2015-02-03 MED ORDER — LIDOCAINE HCL (CARDIAC) 20 MG/ML IV SOLN
INTRAVENOUS | Status: AC
  Filled 2015-02-03: qty 5

## 2015-02-03 MED ORDER — PIPERACILLIN-TAZOBACTAM 3.375 G IVPB
3.3750 g | Freq: Three times a day (TID) | INTRAVENOUS | Status: DC
  Administered 2015-02-03 – 2015-02-04 (×4): 3.375 g via INTRAVENOUS
  Filled 2015-02-03 (×8): qty 50

## 2015-02-03 MED ORDER — LORAZEPAM 2 MG/ML IJ SOLN: 1.0000 mg | Freq: Four times a day (QID) | INTRAMUSCULAR | Status: DC | PRN

## 2015-02-03 MED ORDER — CHLORHEXIDINE GLUCONATE 0.12% ORAL RINSE (MEDLINE KIT)
15.0000 mL | Freq: Two times a day (BID) | OROMUCOSAL | Status: DC
  Administered 2015-02-04: 15 mL via OROMUCOSAL

## 2015-02-03 MED ORDER — LORAZEPAM 2 MG/ML IJ SOLN
0.5000 mg | Freq: Four times a day (QID) | INTRAMUSCULAR | Status: DC | PRN
  Administered 2015-02-03: 0.5 mg via INTRAVENOUS
  Filled 2015-02-03: qty 1

## 2015-02-03 MED ORDER — FOLIC ACID 1 MG PO TABS
1.0000 mg | ORAL_TABLET | Freq: Every day | ORAL | Status: DC
  Administered 2015-02-03 – 2015-02-04 (×2): 1 mg
  Filled 2015-02-03 (×2): qty 1

## 2015-02-03 MED ORDER — INFLUENZA VAC SPLIT QUAD 0.5 ML IM SUSY
0.5000 mL | PREFILLED_SYRINGE | INTRAMUSCULAR | Status: AC
  Administered 2015-02-04: 0.5 mL via INTRAMUSCULAR
  Filled 2015-02-03: qty 0.5

## 2015-02-03 MED ORDER — HYDRALAZINE HCL 20 MG/ML IJ SOLN
10.0000 mg | INTRAMUSCULAR | Status: DC | PRN
  Administered 2015-02-04: 10 mg via INTRAVENOUS
  Filled 2015-02-03: qty 1

## 2015-02-03 MED ORDER — SODIUM CHLORIDE 0.9 % IV SOLN
500.0000 mg | INTRAVENOUS | Status: DC
  Administered 2015-02-04: 500 mg via INTRAVENOUS
  Filled 2015-02-03 (×2): qty 500

## 2015-02-03 MED ORDER — AMANTADINE HCL 100 MG PO CAPS
100.0000 mg | ORAL_CAPSULE | Freq: Three times a day (TID) | ORAL | Status: DC
  Administered 2015-02-03 (×2): 100 mg
  Filled 2015-02-03 (×5): qty 1

## 2015-02-03 MED ORDER — NICOTINE 7 MG/24HR TD PT24: 7.0000 mg | MEDICATED_PATCH | Freq: Every day | TRANSDERMAL | Status: DC

## 2015-02-03 MED ORDER — PNEUMOCOCCAL VAC POLYVALENT 25 MCG/0.5ML IJ INJ
0.5000 mL | INJECTION | INTRAMUSCULAR | Status: AC
  Administered 2015-02-04: 0.5 mL via INTRAMUSCULAR
  Filled 2015-02-03: qty 0.5

## 2015-02-03 MED ORDER — IPRATROPIUM-ALBUTEROL 0.5-2.5 (3) MG/3ML IN SOLN
3.0000 mL | RESPIRATORY_TRACT | Status: DC
  Administered 2015-02-03: 3 mL via RESPIRATORY_TRACT
  Filled 2015-02-03: qty 3

## 2015-02-03 MED ORDER — ANTISEPTIC ORAL RINSE SOLUTION (CORINZ)
7.0000 mL | Freq: Four times a day (QID) | OROMUCOSAL | Status: DC
  Administered 2015-02-03 – 2015-02-04 (×4): 7 mL via OROMUCOSAL

## 2015-02-03 MED ORDER — THIAMINE HCL 100 MG/ML IJ SOLN
100.0000 mg | Freq: Every day | INTRAMUSCULAR | Status: DC
  Administered 2015-02-03: 100 mg via INTRAVENOUS
  Filled 2015-02-03: qty 2

## 2015-02-03 MED ORDER — PRO-STAT SUGAR FREE PO LIQD
30.0000 mL | Freq: Two times a day (BID) | ORAL | Status: DC
  Administered 2015-02-03: 30 mL
  Filled 2015-02-03 (×3): qty 30

## 2015-02-03 MED ORDER — FENTANYL CITRATE (PF) 100 MCG/2ML IJ SOLN
50.0000 ug | INTRAMUSCULAR | Status: DC | PRN
  Administered 2015-02-03 (×2): 50 ug via INTRAVENOUS
  Filled 2015-02-03 (×3): qty 2

## 2015-02-03 MED ORDER — LORAZEPAM 2 MG/ML IJ SOLN
0.0000 mg | Freq: Four times a day (QID) | INTRAMUSCULAR | Status: DC
  Administered 2015-02-03: 2 mg via INTRAVENOUS
  Filled 2015-02-03: qty 1

## 2015-02-03 MED ORDER — VITAMIN B-1 100 MG PO TABS
100.0000 mg | ORAL_TABLET | Freq: Every day | ORAL | Status: DC
  Administered 2015-02-04: 100 mg
  Filled 2015-02-03: qty 1

## 2015-02-03 MED ORDER — VITAMIN B-1 100 MG PO TABS: 100.0000 mg | ORAL_TABLET | Freq: Every day | ORAL | Status: DC

## 2015-02-03 MED ORDER — METHYLPREDNISOLONE SODIUM SUCC 125 MG IJ SOLR
60.0000 mg | Freq: Four times a day (QID) | INTRAMUSCULAR | Status: DC
Start: 2015-02-03 — End: 2015-02-03
  Administered 2015-02-03: 60 mg via INTRAVENOUS
  Filled 2015-02-03: qty 2

## 2015-02-03 MED ORDER — ATORVASTATIN CALCIUM 10 MG PO TABS
10.0000 mg | ORAL_TABLET | Freq: Every day | ORAL | Status: DC
  Administered 2015-02-03: 10 mg
  Filled 2015-02-03 (×2): qty 1

## 2015-02-03 MED ORDER — FOLIC ACID 5 MG/ML IJ SOLN
1.0000 mg | Freq: Every day | INTRAMUSCULAR | Status: DC
  Administered 2015-02-03: 1 mg via INTRAVENOUS
  Filled 2015-02-03 (×3): qty 0.2

## 2015-02-03 MED ORDER — DEXMEDETOMIDINE HCL IN NACL 400 MCG/100ML IV SOLN
0.4000 ug/kg/h | INTRAVENOUS | Status: DC
  Administered 2015-02-03 – 2015-02-04 (×2): 0.8 ug/kg/h via INTRAVENOUS
  Filled 2015-02-03 (×3): qty 100

## 2015-02-03 MED ORDER — FUROSEMIDE 10 MG/ML IJ SOLN
40.0000 mg | Freq: Once | INTRAMUSCULAR | Status: AC
  Administered 2015-02-03: 40 mg via INTRAVENOUS
  Filled 2015-02-03: qty 4

## 2015-02-03 MED ORDER — ALBUTEROL SULFATE (2.5 MG/3ML) 0.083% IN NEBU: 2.5000 mg | INHALATION_SOLUTION | RESPIRATORY_TRACT | Status: DC | PRN

## 2015-02-03 MED ORDER — HEPARIN SODIUM (PORCINE) 5000 UNIT/ML IJ SOLN
5000.0000 [IU] | Freq: Three times a day (TID) | INTRAMUSCULAR | Status: DC
  Administered 2015-02-03 – 2015-02-09 (×20): 5000 [IU] via SUBCUTANEOUS
  Filled 2015-02-03 (×20): qty 1

## 2015-02-03 MED ORDER — DEXMEDETOMIDINE HCL IN NACL 200 MCG/50ML IV SOLN
0.0000 ug/kg/h | INTRAVENOUS | Status: DC
Start: 2015-02-03 — End: 2015-02-03
  Administered 2015-02-03: 0.4 ug/kg/h via INTRAVENOUS
  Filled 2015-02-03 (×2): qty 50

## 2015-02-03 MED ORDER — LORAZEPAM 2 MG/ML IJ SOLN: 0.0000 mg | Freq: Two times a day (BID) | INTRAMUSCULAR | Status: DC

## 2015-02-03 NOTE — Progress Notes (Signed)
Pt was agitated and AMS this AM.  MD notified and orders for ABG obtained.  pO2 was significantly lower after using NRB overnight.  In combination with CXR, MD ordered ventilation and transfer to [facility].  Another IV was placed in left forearm.  During intubation, OGT and foley catheter placed.  Report called to receiving nurse and [facility].  Pt moved to [facility] by [facility].

## 2015-02-03 NOTE — Progress Notes (Signed)
Patient became worse during night Saturation decreased on 50% Venti mask. He also became more restless having to be watched by nurse and aid around clock. Saturation around 85. Placed on NRB mask at 15 lpm. ABG obtained. Nurse notified and paged MD for requested X-RAY. Patient moved to stepdown. Xray showed pulmonary edema or possible ARDS. BiPAP on standby. Patient is very pleasant with schizophrenia.

## 2015-02-03 NOTE — Anesthesia Procedure Notes (Signed)
Procedure Name: Intubation Date/Time: 02/03/2015 11:29 AM Performed by: Franco Nones Pre-anesthesia Checklist: Patient identified, Emergency Drugs available, Suction available, Patient being monitored and Timeout performed Patient Re-evaluated:Patient Re-evaluated prior to inductionOxygen Delivery Method: Ambu bag Preoxygenation: Pre-oxygenation with 100% oxygen Intubation Type: IV induction Ventilation: Mask ventilation without difficulty Laryngoscope Size: Mac and 3 Grade View: Grade I Tube type: Subglottic suction tube Tube size: 8.0 mm Number of attempts: 1 Placement Confirmation: ETT inserted through vocal cords under direct vision,  CO2 detector and breath sounds checked- equal and bilateral Secured at: 23 cm Tube secured with: Tape

## 2015-02-03 NOTE — Progress Notes (Signed)
D-  Patient has been increasingly confused throughout the shift with worsening of his auditory hallucinations.  While sitting with patient his respirations were noted to steadily increase and his became labored with venturi mask at 50% oxygen.  Oxygen saturations were in low 80's.  Lung sounds remain diminished in all fields.    A-  Respiratory therapist called to assist in assessment of patient and the patient was placed on 100% NRB mask. Quickly after being placed on the NRB patient's respiration improved and patient's mental status began to show improve evident by a decreased in his restlessness.  Dr. Karilyn Cota called and notified of the above and he have new orders that were placed in [facility] and carried out.  Report called to ICU RN and patient was transferred to ICU 6.  R-  Patient left in care of ICU staff in with some noted improvement.  Nursing staff to continue to monitor.

## 2015-02-03 NOTE — Progress Notes (Signed)
[facility]Link Physician-Brief Progress Note Patient Name: Jared Austin DOB: 1945/08/13 MRN: [id number]   Date of Service  02/03/2015  HPI/Events of Note  Smoker , schizophrenic with BL CAP & hypoxic resp failure, received ativan for agitation, has good cough, on NRB  eICU Interventions  Dc ativan Use haldol prn If fails haldol, use precedex gtt May need intubation if hypoxia worsens     Intervention Category Major Interventions: Respiratory failure - evaluation and management  ALVA,RAKESH V. 02/03/2015, 4:36 AM

## 2015-02-03 NOTE — Consult Note (Signed)
Consult requested by: Dr. Felecia Shelling Consult requested for respiratory failure:  HPI: This is a 69 year old who came to the hospital on October 1 with shortness of breath acute hypoxic respiratory failure acute kidney failure and elevated troponin and community-acquired pneumonia. He initially did fairly well and was actually able to ambulate in the halls but he became increasingly short of breath over the last 12 hours or so and was transferred to the ICU and placed on nonrebreather mask. His oxygenation has been adequate on the nonrebreather except right before I saw him when he rolled over on his right side he developed hypoxia into the high 80s. This has improved with positioning. He is really not able to give [place] any history now. He has a history of substantial tobacco use and presumed COPD, anxiety, schizophrenia, and chronic alcohol abuse. He may be having some alcohol withdrawal. His chest x-ray looks much worse than on admission and could represent pulmonary edema, ARDS, widespread pneumonia.  Past Medical History  Diagnosis Date  . Anxiety   . Schizophrenia (HCC)      History reviewed. No pertinent family history.   Social History   Social History  . Marital Status: Single    Spouse Name: N/A  . Number of Children: N/A  . Years of Education: N/A   Social History Main Topics  . Smoking status: Current Every Day Smoker -- 1.50 packs/day for 62 years    Types: Cigarettes  . Smokeless tobacco: None  . Alcohol Use: No  . Drug Use: No  . Sexual Activity: Not Asked   Other Topics Concern  . None   Social History Narrative     ROS: Not obtainable    Objective: Vital signs in last 24 hours: Temp:  [97.9 F (36.6 C)-98.9 F (37.2 C)] 98.9 F (37.2 C) (10/03 0755) Pulse Rate:  [92-106] 99 (10/03 0500) Resp:  [20-31] 31 (10/03 0500) BP: (158-189)/(68-94) 160/72 mmHg (10/03 0500) SpO2:  [82 %-100 %] 100 % (10/03 0752) FiO2 (%):  [50 %-100 %] 100 % (10/03 0300) Weight:   [51.8 kg (114 lb 3.2 oz)] 51.8 kg (114 lb 3.2 oz) (10/03 0402) Weight change: -7.621 kg (-16 lb 12.8 oz) Last BM Date: 02/01/15  Intake/Output from previous day: 10/02 0701 - 10/03 0700 In: 1320 [P.O.:720; IV Piggyback:600] Out: 300 [Urine:300]  PHYSICAL EXAM He is awake but agitated and really speaking unintelligibly. He is wearing a nonrebreather mask. His pupils react. His nose and throat are clear. His neck is supple without masses. His chest shows rales diffusely with some rhonchi and wheezing. His heart is regular without gallop. His abdomen is soft bowel sounds are present on ANY MASSES. HE DOES NOT HAVE ANY EDEMA. CENTRAL NERVOUS SYSTEM EXAMINATION SHOWS THAT HE MOVES ALL 4 EXTREMITIES BUT HE IS VERY AGITATED AND NOT COOPERATIVE  Lab Results: Basic Metabolic Panel:  Recent Labs  16/10/96 1540 02/02/15 0608  NA 138 139  K 3.6 3.1*  CL 106 108  CO2 19* 20*  GLUCOSE 136* 144*  BUN 47* 34*  CREATININE 2.28* 1.80*  CALCIUM 8.7* 8.9   Liver Function Tests:  Recent Labs  02/01/15 1540  AST 48*  ALT 15*  ALKPHOS 63  BILITOT 0.8  PROT 6.9  ALBUMIN 3.2*   No results for input(s): LIPASE, AMYLASE in the last 72 hours. No results for input(s): AMMONIA in the last 72 hours. CBC:  Recent Labs  02/01/15 1540 02/02/15 0608  WBC 12.5* 10.8*  NEUTROABS 11.5*  --  HGB 10.4* 10.5*  HCT 30.5* 30.9*  MCV 89.2 89.6  PLT 256 226   Cardiac Enzymes:  Recent Labs  02/01/15 1540 02/01/15 2118  TROPONINI 0.07* 0.06*   BNP: No results for input(s): PROBNP in the last 72 hours. D-Dimer: No results for input(s): DDIMER in the last 72 hours. CBG: No results for input(s): GLUCAP in the last 72 hours. Hemoglobin A1C: No results for input(s): HGBA1C in the last 72 hours. Fasting Lipid Panel: No results for input(s): CHOL, HDL, LDLCALC, TRIG, CHOLHDL, LDLDIRECT in the last 72 hours. Thyroid Function Tests: No results for input(s): TSH, T4TOTAL, FREET4, T3FREE, THYROIDAB  in the last 72 hours. Anemia Panel: No results for input(s): VITAMINB12, FOLATE, FERRITIN, TIBC, IRON, RETICCTPCT in the last 72 hours. Coagulation: No results for input(s): LABPROT, INR in the last 72 hours. Urine Drug Screen: Drugs of Abuse  No results found for: LABOPIA, COCAINSCRNUR, LABBENZ, AMPHETMU, THCU, LABBARB  Alcohol Level: No results for input(s): ETH in the last 72 hours. Urinalysis: No results for input(s): COLORURINE, LABSPEC, PHURINE, GLUCOSEU, HGBUR, BILIRUBINUR, KETONESUR, PROTEINUR, UROBILINOGEN, NITRITE, LEUKOCYTESUR in the last 72 hours.  Invalid input(s): APPERANCEUR Misc. Labs:   ABGS:  Recent Labs  02/03/15 0233  PHART 7.380  PO2ART 81.7  HCO3 21.9     MICROBIOLOGY: No results found for this or any previous visit (from the past 240 hour(s)).  Studies/Results: Dg Chest 2 View  02/01/2015   CLINICAL DATA:  Cough and short of breath  EXAM: CHEST  2 VIEW  COMPARISON:  01/18/2015  FINDINGS: Interval development of prominent reticulonodular densities throughout both lungs. No effusion. No lobar infiltrate. Mild hyperinflation of the lungs  Heart size is normal.  Vascularity normal.  IMPRESSION: Interval development of reticular nodular markings in the lungs right greater than left. This may be due to acute infection or inhalational lung disease. Consider virus or mycoplasma pneumonia.   Electronically Signed   By: Marlan Palau M.D.   On: 02/01/2015 16:07   Dg Chest Port 1 View  02/03/2015   CLINICAL DATA:  Acute onset of hypoxia.  Initial encounter.  EXAM: PORTABLE CHEST 1 VIEW  COMPARISON:  Chest radiograph performed 02/01/2015  FINDINGS: The lungs are well-aerated. Diffuse bilateral airspace opacification is noted. This may reflect multifocal pneumonia or possibly pulmonary edema. ARDS could have such an appearance. There is no evidence of pleural effusion or pneumothorax.  The cardiomediastinal silhouette is within normal limits. No acute osseous  abnormalities are seen.  IMPRESSION: Diffuse bilateral airspace opacification noted. This may reflect multifocal pneumonia or possibly pulmonary edema. ARDS could have such an appearance.   Electronically Signed   By: Roanna Raider M.D.   On: 02/03/2015 03:33    Medications:  Prior to Admission:  Prescriptions prior to admission  Medication Sig Dispense Refill Last Dose  . amantadine (SYMMETREL) 100 MG capsule Take 100 mg by mouth 3 (three) times daily.   02/01/2015  . Aspirin-Salicylamide-Caffeine (BC HEADACHE POWDER PO) Take 1 packet by mouth every 6 (six) hours as needed (Pain).   02/01/2015  . atorvastatin (LIPITOR) 10 MG tablet Take 10 mg by mouth daily.   02/01/2015  . INVEGA SUSTENNA 156 MG/ML SUSP injection Inject 156 mg as directed every 6 (six) weeks.    More Than A month   Scheduled: . amantadine  100 mg Oral TID  . atorvastatin  10 mg Oral q1800  . enoxaparin (LOVENOX) injection  30 mg Subcutaneous Q24H  . folic acid  1 mg  Intravenous Daily  . ipratropium-albuterol  3 mL Nebulization Q4H  . LORazepam  0-4 mg Intravenous Q6H   Followed by  . [START ON 02/05/2015] LORazepam  0-4 mg Intravenous Q12H  . methylPREDNISolone (SOLU-MEDROL) injection  60 mg Intravenous Q6H  . nicotine  21 mg Transdermal Daily  . piperacillin-tazobactam (ZOSYN)  IV  3.375 g Intravenous Q8H  . thiamine  100 mg Oral Daily   Or  . thiamine  100 mg Intravenous Daily  . [START ON 02/04/2015] vancomycin  500 mg Intravenous Q24H  . vancomycin  1,000 mg Intravenous Once   Continuous: . dextrose     NFA:[id number], albuterol, haloperidol lactate, LORazepam **OR** LORazepam, morphine injection  Assesment: He was admitted with community-acquired pneumonia and acute hypoxic respiratory failure and he is clearly much worse now. His chest x-ray has changed significantly and he is requiring much more oxygen. He has become confused. His kidney failure seems to be better. He did have elevated troponin that is  thought to be related to demand ischemia and hypoxia.  His chest x-ray findings could be pulmonary edema or diffuse pneumonia or ARDS. At this point he does not require either BiPAP or intubation and mechanical ventilation but he will have to be observed closely in the intensive care unit.  He has COPD at baseline and is being treated with nebulizer treatments. I have increased the frequency of those and also added increased doses of steroids for his COPD and pneumonia.  His kidney failure looked better yesterday.  Because of the possibility that this is pulmonary edema I ordered BNP and echocardiogram.  I have broadened his antibiotic coverage because he has not responded to current treatment Active Problems:   CAP (community acquired pneumonia)   Acute respiratory failure with hypoxia (HCC)   Elevated troponin   Acute kidney failure (HCC)    Plan: As above. He will be monitored closely and may need BiPAP or even intubation. He is clearly critically ill with multi-system problems that requires high intensity and high complexity decision making    LOS: 2 days   Nabilah Davoli L 02/03/2015, 8:36 AM

## 2015-02-03 NOTE — H&P (Signed)
PULMONARY / CRITICAL CARE MEDICINE   Name: Jared Austin MRN: [id number] DOB: Dec 27, 1945    ADMISSION DATE:  02/01/2015 CONSULTATION DATE:  02/02/05  REFERRING MD :  Dr. Felecia Shelling   CHIEF COMPLAINT:  Respiratory Failure  INITIAL PRESENTATION:  69 y/o M, smoker, with PMH of schizophrenia who was admitted to [facility] on 10/1 with SOB.  Found to be hypoxic (86% on RA), concern for CAP, elevated troponin and AKI.  Admitted per [facility].  He decompensated requiring intubation 10/3 and was transferred to [facility] for further evaluation.   STUDIES:  10/03  Echo >>  SIGNIFICANT EVENTS: 10/1  Admit with SOB 10/3  Transferred to [facility]   HISTORY OF PRESENT ILLNESS:  Pt is encephalopathic; therefore, this HPI is obtained from chart review. Jared Austin is a 69 y.o. M with PMH of anxiety, chronic ETOH abuse (? Suspected), presumed COPD and schizophrenia (on amantadine and invega) as well as significant tobacco use (93 pack year hx).  He presented to [facility] ED 10/1 with SOB that had started roughly 48 hours prior and worsened.  Symptoms exacerbated with ambulation, relieved with rest.  No hx fevers/chills/sweats, chest pain, cough, N/V/D, abd pain, myalgias.  Initial workup was notable for hypoxemia on RA with sats in the mid 80's, WBCs 12.5, with AKI with creatinine 2.28 (unclear baseline), troponin 0.07, lactic acid 1.7, and BNP 222.  Pt was admitted per [facility] for SOB with concerns for CAP.  Initial chest xray suggestive R>L airspace disease (reticular nodular pattern). Pt was treated with oxygen, rocephin, azithromycin, and volume resuscitated with 1L NS.  Follow up CXR on 10/3 showed worsening bilateral airspace disease & concern for edema.  He developed progressive respiratory distress / increasing O2 needs on 10/3 requiring intubation.  Subsequently transferred to [facility] for further care.    PAST MEDICAL HISTORY :   has a past medical history of Anxiety and Schizophrenia (HCC).  has no past surgical history on file.    Prior to Admission medications   Medication Sig Start Date End Date Taking? Authorizing Provider  amantadine (SYMMETREL) 100 MG capsule Take 100 mg by mouth 3 (three) times daily. 01/21/15  Yes Historical Provider, MD  Aspirin-Salicylamide-Caffeine (BC HEADACHE POWDER PO) Take 1 packet by mouth every 6 (six) hours as needed (Pain).   Yes Historical Provider, MD  atorvastatin (LIPITOR) 10 MG tablet Take 10 mg by mouth daily. 01/08/15  Yes Historical Provider, MD  INVEGA SUSTENNA 156 MG/ML SUSP injection Inject 156 mg as directed every 6 (six) weeks.  01/21/15  Yes Historical Provider, MD   Allergies  Allergen Reactions  . Penicillins Anxiety    FAMILY HISTORY:  History reviewed. No pertinent family history.  SOCIAL HISTORY:  reports that he has been smoking Cigarettes.  He has a 93 pack-year smoking history. He does not have any smokeless tobacco history on file. He reports that he does not drink alcohol or use illicit drugs.  REVIEW OF SYSTEMS:   Unable to complete as patient is altered on mechanical ventilation.   SUBJECTIVE:   VITAL SIGNS: Temp:  [97.9 F (36.6 C)-98.9 F (37.2 C)] 98.9 F (37.2 C) (10/03 1306) Pulse Rate:  [76-109] 91 (10/03 1345) Resp:  [20-31] 26 (10/03 1345) BP: (109-189)/(62-94) 134/71 mmHg (10/03 1345) SpO2:  [82 %-100 %] 100 % (10/03 1345) FiO2 (%):  [50 %-100 %] 90 % (10/03 1227) Weight:  [51.8 kg (114 lb 3.2 oz)] 51.8 kg (114 lb 3.2 oz) (10/03 0402)   HEMODYNAMICS:  VENTILATOR SETTINGS: Vent Mode:  [-] PRVC FiO2 (%):  [50 %-100 %] 90 % Set Rate:  [18 bmp-22 bmp] 18 bmp Vt Set:  [410 mL] 410 mL PEEP:  [5 cmH20-14 cmH20] 5 cmH20 Plateau Pressure:  [29 cmH20] 29 cmH20   INTAKE / OUTPUT: Intake/Output      10/02 0701 - 10/03 0700 10/03 0701 - 10/04 0700   P.O. 720    IV Piggyback 600    Total Intake(mL/kg) 1320 (25.5)    Urine (mL/kg/hr) 300 (0.2)    Total Output 300     Net +1020          Urine Occurrence 1 x      PHYSICAL  EXAMINATION: General: thin adult male in NAD Neuro: sedate on vent HEENT: OETT, MM pink/moist, arcus senilis  Cardiovascular: s1s2 rrr, no m/r/g Lungs: resp's even/non-labored, lungs bilaterally with fine inspiratory crackles lower posterior Abdomen: flat, soft, bsx4 active  Musculoskeletal: no acute deformities  Skin: warm/dry, no edema, rashes or lesions   LABS:  CBC  Recent Labs Lab 02/01/15 1540 02/02/15 0608 02/03/15 0835  WBC 12.5* 10.8* 16.7*  HGB 10.4* 10.5* 11.6*  HCT 30.5* 30.9* 34.0*  PLT 256 226 228   Coag's No results for input(s): APTT, INR in the last 168 hours.   BMET  Recent Labs Lab 02/01/15 1540 02/02/15 0608 02/03/15 0835  NA 138 139 138  K 3.6 3.1* 3.4*  CL 106 108 106  CO2 19* 20* 22  BUN 47* 34* 41*  CREATININE 2.28* 1.80* 2.00*  GLUCOSE 136* 144* 159*   Electrolytes  Recent Labs Lab 02/01/15 1540 02/02/15 0608 02/03/15 0835  CALCIUM 8.7* 8.9 9.1   Sepsis Markers  Recent Labs Lab 02/01/15 1547  LATICACIDVEN 1.37   ABG  Recent Labs Lab 02/03/15 0233 02/03/15 0948 02/03/15 1257  PHART 7.380 7.380 7.278*  PCO2ART 36.5 40.1 52.6*  PO2ART 81.7 64.3* 367*   Liver Enzymes  Recent Labs Lab 02/01/15 1540 02/03/15 0835  AST 48* 34  ALT 15* 18  ALKPHOS 63 75  BILITOT 0.8 0.7  ALBUMIN 3.2* 3.0*   Cardiac Enzymes  Recent Labs Lab 02/01/15 1540 02/01/15 2118 02/03/15 0835  TROPONINI 0.07* 0.06* 0.20*   Glucose No results for input(s): GLUCAP in the last 168 hours.  Imaging Dg Chest Port 1 View  02/03/2015   CLINICAL DATA:  Respiratory failure.  EXAM: PORTABLE CHEST 1 VIEW  COMPARISON:  Chest radiograph from earlier today.  FINDINGS: Endotracheal tube tip is just below the thoracic inlet 6.6 cm above the carina. Enteric tube terminates in the proximal stomach. Stable cardiomediastinal silhouette with normal heart size. No pneumothorax. No pleural effusion. There are severe patchy airspace opacities throughout both  lungs in a vaguely parahilar distribution, not appreciably changed.  IMPRESSION: 1. Endotracheal tube tip just below the thoracic inlet 6.6 cm above the carina. Enteric tube terminates in the proximal stomach. 2. No appreciable change in severe patchy bilateral airspace opacities with the differential of diffuse alveolar hemorrhage, multifocal pneumonia or ARDS.   Electronically Signed   By: Delbert Phenix M.D.   On: 02/03/2015 13:34   Dg Chest Port 1 View  02/03/2015   CLINICAL DATA:  Acute onset of hypoxia.  Initial encounter.  EXAM: PORTABLE CHEST 1 VIEW  COMPARISON:  Chest radiograph performed 02/01/2015  FINDINGS: The lungs are well-aerated. Diffuse bilateral airspace opacification is noted. This may reflect multifocal pneumonia or possibly pulmonary edema. ARDS could have such an appearance. There is no  evidence of pleural effusion or pneumothorax.  The cardiomediastinal silhouette is within normal limits. No acute osseous abnormalities are seen.  IMPRESSION: Diffuse bilateral airspace opacification noted. This may reflect multifocal pneumonia or possibly pulmonary edema. ARDS could have such an appearance.   Electronically Signed   By: Roanna Raider M.D.   On: 02/03/2015 03:33      ASSESSMENT / PLAN:  PULMONARY OETT 10/3 >> A: Acute Respiratory Failure  - concern for CAP +/- pulmonary edema Hypercarbic respiratory failure Presumed COPD  Tobacco Abuse  P:   MV support, 8 cc/kg Wean PEEP / FiO2 for sats 90-95% Assess CXR now post transport and in am Duonebs Q6 with Q3 PRN albuterol  Solu-medrol 40 mg IV Q8 See ID   CARDIOVASCULAR CVL  A:  Elevated troponin, suspected secondary to demand ischemia but r/o ACI Hypertension - ? Baseline hx of  P:  ICU monitoring  Follow troponin  Assess ECHO with CXR findings  Trend BNP  PRN hydralazine  RENAL A:   AKI - unclear baseline Hypokalemia P:   Trend BMP / UOP  Replace electrolytes as indicated   GASTROINTESTINAL A:    Suspected Protein Calorie Malnutrition  P:   Place OGT  Begin TF per nutrition  PPI   HEMATOLOGIC A:   Anemia P:  Trend CBC  Heparin for DVT prophylaxis   INFECTIOUS A:   CAP - initial chest xray with concern for reticular nodular airspace disease, R>L P:   Sputum Cx 10/3 >>   Rocephin / Azithro, start date 10/1 >> 10/3  Vanco, start date 10/3, day 1/x Zosyn, start date 10/3, day 1/x   PCT algorithm to limit abx exposure. Trend fever curve / WBC  ENDOCRINE A:   Hyperglycemia  P:   Monitor glucose on BMP.  If consistently > 180, add SSI   NEUROLOGIC A:   Schizophrenia and anxiety ? ETOH Abuse  P:   Precedex gtt  GOAL RASS:  -1 PRN fentanyl  Thiamine / Folate / MVI Continue amantadine  Family updated: No family available 10/3.    Interdisciplinary Family Meeting v Palliative Care Meeting:  Due by: 10/10    Canary Brim, NP-C [facility] Pgr: [phone] or if no answer [phone] 02/03/2015, 3:24 PM

## 2015-02-03 NOTE — Progress Notes (Signed)
Pt very agitated while maxed out on Precedex gtt. Pt follows commands but non-compliant. He has self-extubated from ventilator.

## 2015-02-03 NOTE — Progress Notes (Signed)
Subjective: Patient was admitted yesterday due cough and shortness of breath. He is a known case of schizophrenia, heavy tobacco smoking and alcohol use. Patient confused disoriented and has labored breathing.  Objective: Vital signs in last 24 hours: Temp:  [97.9 F (36.6 C)-98.9 F (37.2 C)] 98.9 F (37.2 C) (10/03 0755) Pulse Rate:  [92-106] 99 (10/03 0500) Resp:  [20-31] 31 (10/03 0500) BP: (158-189)/(68-94) 160/72 mmHg (10/03 0500) SpO2:  [82 %-100 %] 100 % (10/03 0752) FiO2 (%):  [50 %-100 %] 100 % (10/03 0300) Weight:  [51.8 kg (114 lb 3.2 oz)] 51.8 kg (114 lb 3.2 oz) (10/03 0402) Weight change: -7.621 kg (-16 lb 12.8 oz) Last BM Date: 02/01/15  Intake/Output from previous day: 10/02 0701 - 10/03 0700 In: 1320 [P.O.:720; IV Piggyback:600] Out: 300 [Urine:300]  PHYSICAL EXAM General appearance: delirious and mild distress Resp: dullness to percussion bilaterally and rhonchi bilaterally Cardio: S1, S2 normal GI: soft, non-tender; bowel sounds normal; no masses,  no organomegaly Extremities: extremities normal, atraumatic, no cyanosis or edema  Lab Results:  Results for orders placed or performed during the hospital encounter of 02/01/15 (from the past 48 hour(s))  Brain natriuretic peptide     Status: Abnormal   Collection Time: 02/01/15  3:30 PM  Result Value Ref Range   B Natriuretic Peptide 222.0 (H) 0.0 - 100.0 pg/mL  CBC with Differential/Platelet     Status: Abnormal   Collection Time: 02/01/15  3:40 PM  Result Value Ref Range   WBC 12.5 (H) 4.0 - 10.5 K/uL   RBC 3.42 (L) 4.22 - 5.81 MIL/uL   Hemoglobin 10.4 (L) 13.0 - 17.0 g/dL   HCT 30.5 (L) 39.0 - 52.0 %   MCV 89.2 78.0 - 100.0 fL   MCH 30.4 26.0 - 34.0 pg   MCHC 34.1 30.0 - 36.0 g/dL   RDW 12.5 11.5 - 15.5 %   Platelets 256 150 - 400 K/uL   Neutrophils Relative % 92 %   Neutro Abs 11.5 (H) 1.7 - 7.7 K/uL   Lymphocytes Relative 3 %   Lymphs Abs 0.4 (L) 0.7 - 4.0 K/uL   Monocytes Relative 5 %   Monocytes Absolute 0.6 0.1 - 1.0 K/uL   Eosinophils Relative 0 %   Eosinophils Absolute 0.0 0.0 - 0.7 K/uL   Basophils Relative 0 %   Basophils Absolute 0.0 0.0 - 0.1 K/uL  Comprehensive metabolic panel     Status: Abnormal   Collection Time: 02/01/15  3:40 PM  Result Value Ref Range   Sodium 138 135 - 145 mmol/L   Potassium 3.6 3.5 - 5.1 mmol/L   Chloride 106 101 - 111 mmol/L   CO2 19 (L) 22 - 32 mmol/L   Glucose, Bld 136 (H) 65 - 99 mg/dL   BUN 47 (H) 6 - 20 mg/dL   Creatinine, Ser 2.28 (H) 0.61 - 1.24 mg/dL   Calcium 8.7 (L) 8.9 - 10.3 mg/dL   Total Protein 6.9 6.5 - 8.1 g/dL   Albumin 3.2 (L) 3.5 - 5.0 g/dL   AST 48 (H) 15 - 41 U/L   ALT 15 (L) 17 - 63 U/L   Alkaline Phosphatase 63 38 - 126 U/L   Total Bilirubin 0.8 0.3 - 1.2 mg/dL   GFR calc non Af Amer 28 (L) >60 mL/min   GFR calc Af Amer 32 (L) >60 mL/min    Comment: (NOTE) The eGFR has been calculated using the CKD EPI equation. This calculation has not been validated  in all clinical situations. eGFR's persistently <60 mL/min signify possible Chronic Kidney Disease.    Anion gap 13 5 - 15  Troponin I     Status: Abnormal   Collection Time: 02/01/15  3:40 PM  Result Value Ref Range   Troponin I 0.07 (H) <0.031 ng/mL    Comment:        PERSISTENTLY INCREASED TROPONIN VALUES IN THE RANGE OF 0.04-0.49 ng/mL CAN BE SEEN IN:       -UNSTABLE ANGINA       -CONGESTIVE HEART FAILURE       -MYOCARDITIS       -CHEST TRAUMA       -ARRYHTHMIAS       -LATE PRESENTING MYOCARDIAL INFARCTION       -COPD   CLINICAL FOLLOW-UP RECOMMENDED.   I-Stat CG4 Lactic Acid, ED     Status: None   Collection Time: 02/01/15  3:47 PM  Result Value Ref Range   Lactic Acid, Venous 1.37 0.5 - 2.0 mmol/L  Blood gas, arterial ([facility] & [facility] ONLY)     Status: Abnormal   Collection Time: 02/01/15  4:26 PM  Result Value Ref Range   O2 Content 2.0 L/min   Delivery systems NASAL CANNULA    pH, Arterial 7.361 7.350 - 7.450   pCO2 arterial 33.4 (L)  35.0 - 45.0 mmHg   pO2, Arterial 63.8 (L) 80.0 - 100.0 mmHg   Bicarbonate 19.6 (L) 20.0 - 24.0 mEq/L   Acid-base deficit 6.0 (H) 0.0 - 2.0 mmol/L   O2 Saturation 90.7 %   Collection site RIGHT RADIAL    Drawn by [phone]    Sample type ARTERIAL    Allens test (pass/fail) PASS PASS  Troponin I     Status: Abnormal   Collection Time: 02/01/15  9:18 PM  Result Value Ref Range   Troponin I 0.06 (H) <0.031 ng/mL    Comment:        PERSISTENTLY INCREASED TROPONIN VALUES IN THE RANGE OF 0.04-0.49 ng/mL CAN BE SEEN IN:       -UNSTABLE ANGINA       -CONGESTIVE HEART FAILURE       -MYOCARDITIS       -CHEST TRAUMA       -ARRYHTHMIAS       -LATE PRESENTING MYOCARDIAL INFARCTION       -COPD   CLINICAL FOLLOW-UP RECOMMENDED.   CBC     Status: Abnormal   Collection Time: 02/02/15  6:08 AM  Result Value Ref Range   WBC 10.8 (H) 4.0 - 10.5 K/uL   RBC 3.45 (L) 4.22 - 5.81 MIL/uL   Hemoglobin 10.5 (L) 13.0 - 17.0 g/dL   HCT 30.9 (L) 39.0 - 52.0 %   MCV 89.6 78.0 - 100.0 fL   MCH 30.4 26.0 - 34.0 pg   MCHC 34.0 30.0 - 36.0 g/dL   RDW 12.7 11.5 - 15.5 %   Platelets 226 150 - 400 K/uL  Basic metabolic panel     Status: Abnormal   Collection Time: 02/02/15  6:08 AM  Result Value Ref Range   Sodium 139 135 - 145 mmol/L   Potassium 3.1 (L) 3.5 - 5.1 mmol/L   Chloride 108 101 - 111 mmol/L   CO2 20 (L) 22 - 32 mmol/L   Glucose, Bld 144 (H) 65 - 99 mg/dL   BUN 34 (H) 6 - 20 mg/dL   Creatinine, Ser 1.80 (H) 0.61 - 1.24 mg/dL   Calcium 8.9 8.9 -  10.3 mg/dL   GFR calc non Af Amer 37 (L) >60 mL/min   GFR calc Af Amer 43 (L) >60 mL/min    Comment: (NOTE) The eGFR has been calculated using the CKD EPI equation. This calculation has not been validated in all clinical situations. eGFR's persistently <60 mL/min signify possible Chronic Kidney Disease.    Anion gap 11 5 - 15  Blood gas, arterial     Status: Abnormal   Collection Time: 02/03/15  2:33 AM  Result Value Ref Range   FIO2 100.00     Delivery systems NON-REBREATHER OXYGEN MASK    pH, Arterial 7.380 7.350 - 7.450   pCO2 arterial 36.5 35.0 - 45.0 mmHg   pO2, Arterial 81.7 80.0 - 100.0 mmHg   Bicarbonate 21.9 20.0 - 24.0 mEq/L   Acid-base deficit 3.1 (H) 0.0 - 2.0 mmol/L   O2 Saturation 94.9 %   Patient temperature 98.6    Collection site BRACHIAL ARTERY    Drawn by [id number]    Sample type ARTERIAL    Allens test (pass/fail) NOT INDICATED (A) PASS    ABGS  Recent Labs  02/03/15 0233  PHART 7.380  PO2ART 81.7  HCO3 21.9   CULTURES No results found for this or any previous visit (from the past 240 hour(s)). Studies/Results: Dg Chest 2 View  02/01/2015   CLINICAL DATA:  Cough and short of breath  EXAM: CHEST  2 VIEW  COMPARISON:  01/18/2015  FINDINGS: Interval development of prominent reticulonodular densities throughout both lungs. No effusion. No lobar infiltrate. Mild hyperinflation of the lungs  Heart size is normal.  Vascularity normal.  IMPRESSION: Interval development of reticular nodular markings in the lungs right greater than left. This may be due to acute infection or inhalational lung disease. Consider virus or mycoplasma pneumonia.   Electronically Signed   By: Franchot Gallo M.D.   On: 02/01/2015 16:07   Dg Chest Port 1 View  02/03/2015   CLINICAL DATA:  Acute onset of hypoxia.  Initial encounter.  EXAM: PORTABLE CHEST 1 VIEW  COMPARISON:  Chest radiograph performed 02/01/2015  FINDINGS: The lungs are well-aerated. Diffuse bilateral airspace opacification is noted. This may reflect multifocal pneumonia or possibly pulmonary edema. ARDS could have such an appearance. There is no evidence of pleural effusion or pneumothorax.  The cardiomediastinal silhouette is within normal limits. No acute osseous abnormalities are seen.  IMPRESSION: Diffuse bilateral airspace opacification noted. This may reflect multifocal pneumonia or possibly pulmonary edema. ARDS could have such an appearance.   Electronically Signed    By: Garald Balding M.D.   On: 02/03/2015 03:33    Medications: I have reviewed the patient's current medications.  Assesment:   Active Problems:   CAP (community acquired pneumonia)   Acute respiratory failure with hypoxia (HCC)   Elevated troponin   Acute kidney failure ([place])    Plan:  Medications reviewed Will continue combinations of IV antibiotics Will start on alcohol withdrawal protocol Will start on Iv fluid Will do pulmonary and nephrology consults.     LOS: 2 days   Jacon Whetzel 02/03/2015, 8:33 AM

## 2015-02-03 NOTE — Progress Notes (Signed)
ANTIBIOTIC CONSULT NOTE   Pharmacy Consult for vancomycin and zosyn Indication: pneumonia  Allergies  Allergen Reactions  . Penicillins Anxiety    Patient Measurements: Height:  (172.7 cm) Weight: 114 lb 3.2 oz (51.8 kg) IBW/kg (Calculated) : 68.4 Adjusted Body Weight:   Vital Signs: Temp: 98.9 F (37.2 C) (10/03 0755) Temp Source: Axillary (10/03 0755) BP: 160/72 mmHg (10/03 0500) Pulse Rate: 99 (10/03 0500) Intake/Output from previous day: 10/02 0701 - 10/03 0700 In: 1320 [P.O.:720; IV Piggyback:600] Out: 300 [Urine:300] Intake/Output from this shift:    Labs:  Recent Labs  02/01/15 1540 02/02/15 0608  WBC 12.5* 10.8*  HGB 10.4* 10.5*  PLT 256 226  CREATININE 2.28* 1.80*   Estimated Creatinine Clearance: 28.8 mL/min (by C-G formula based on Cr of 1.8). No results for input(s): VANCOTROUGH, VANCOPEAK, VANCORANDOM, GENTTROUGH, GENTPEAK, GENTRANDOM, TOBRATROUGH, TOBRAPEAK, TOBRARND, AMIKACINPEAK, AMIKACINTROU, AMIKACIN in the last 72 hours.   Microbiology: No results found for this or any previous visit (from the past 720 hour(s)).  Medical History: Past Medical History  Diagnosis Date  . Anxiety   . Schizophrenia (HCC)     Medications:  Scheduled:  . amantadine  100 mg Oral TID  . atorvastatin  10 mg Oral q1800  . enoxaparin (LOVENOX) injection  30 mg Subcutaneous Q24H  . folic acid  1 mg Intravenous Daily  . ipratropium-albuterol  3 mL Nebulization Q4H  . LORazepam  0-4 mg Intravenous Q6H   Followed by  . [START ON 02/05/2015] LORazepam  0-4 mg Intravenous Q12H  . methylPREDNISolone (SOLU-MEDROL) injection  60 mg Intravenous Q6H  . nicotine  21 mg Transdermal Daily  . piperacillin-tazobactam (ZOSYN)  IV  3.375 g Intravenous Q8H  . thiamine  100 mg Oral Daily   Or  . thiamine  100 mg Intravenous Daily  . vancomycin  1,000 mg Intravenous Once   Assessment: 69 yo man started on azithromycin and ceftriaxone for PNA now to change to vanc and  zosyn for worsening respiratory function. His renal function is improving.    Goal of Therapy:  Vanc trough 15-20 mg/L  Plan:  Vancomycin 1 gm IV X 1 then 500 mg IV q24 hours Zosyn 3.375 gm IV q8 hours IE F/u renal function, cultures and clinical course  Thanks for allowing pharmacy to be a part of this patient's care.  Talbert Cage, PharmD Clinical Pharmacist 02/03/2015,8:24 AM

## 2015-02-04 ENCOUNTER — Inpatient Hospital Stay (HOSPITAL_COMMUNITY): Payer: Medicare Other

## 2015-02-04 DIAGNOSIS — N170 Acute kidney failure with tubular necrosis: Secondary | ICD-10-CM

## 2015-02-04 DIAGNOSIS — R0689 Other abnormalities of breathing: Secondary | ICD-10-CM

## 2015-02-04 DIAGNOSIS — J810 Acute pulmonary edema: Secondary | ICD-10-CM

## 2015-02-04 DIAGNOSIS — J811 Chronic pulmonary edema: Secondary | ICD-10-CM | POA: Diagnosis present

## 2015-02-04 DIAGNOSIS — J189 Pneumonia, unspecified organism: Secondary | ICD-10-CM | POA: Diagnosis not present

## 2015-02-04 LAB — BASIC METABOLIC PANEL
Anion gap: 9 (ref 5–15)
BUN: 52 mg/dL — AB (ref 6–20)
CALCIUM: 9.4 mg/dL (ref 8.9–10.3)
CO2: 25 mmol/L (ref 22–32)
CREATININE: 2.28 mg/dL — AB (ref 0.61–1.24)
Chloride: 103 mmol/L (ref 101–111)
GFR calc non Af Amer: 28 mL/min — ABNORMAL LOW (ref >60)
GFR, EST AFRICAN AMERICAN: 32 mL/min — AB (ref >60)
GLUCOSE: 166 mg/dL — AB (ref 65–99)
Potassium: 3.8 mmol/L (ref 3.5–5.1)
Sodium: 137 mmol/L (ref 135–145)

## 2015-02-04 LAB — CBC
HCT: 31.7 % — ABNORMAL LOW (ref 39.0–52.0)
Hemoglobin: 10.7 g/dL — ABNORMAL LOW (ref 13.0–17.0)
MCH: 30.5 pg (ref 26.0–34.0)
MCHC: 33.8 g/dL (ref 30.0–36.0)
MCV: 90.3 fL (ref 78.0–100.0)
PLATELETS: 174 10*3/uL (ref 150–400)
RBC: 3.51 MIL/uL — ABNORMAL LOW (ref 4.22–5.81)
RDW: 13.5 % (ref 11.5–15.5)
WBC: 11.6 10*3/uL — ABNORMAL HIGH (ref 4.0–10.5)

## 2015-02-04 LAB — PHOSPHORUS: Phosphorus: 4.9 mg/dL — ABNORMAL HIGH (ref 2.5–4.6)

## 2015-02-04 LAB — POCT I-STAT 3, ART BLOOD GAS (G3+)
BICARBONATE: 26.2 meq/L — AB (ref 20.0–24.0)
O2 Saturation: 94 %
PCO2 ART: 45.3 mmHg — AB (ref 35.0–45.0)
Patient temperature: 98.2
TCO2: 28 mmol/L (ref 0–100)
pH, Arterial: 7.368 (ref 7.350–7.450)
pO2, Arterial: 74 mmHg — ABNORMAL LOW (ref 80.0–100.0)

## 2015-02-04 LAB — PROCALCITONIN: PROCALCITONIN: 0.51 ng/mL

## 2015-02-04 LAB — HIV ANTIBODY (ROUTINE TESTING W REFLEX): HIV Screen 4th Generation wRfx: NONREACTIVE

## 2015-02-04 LAB — GLUCOSE, CAPILLARY
Glucose-Capillary: 182 mg/dL — ABNORMAL HIGH (ref 65–99)
Glucose-Capillary: 176 mg/dL — ABNORMAL HIGH (ref 65–99)
Glucose-Capillary: 163 mg/dL — ABNORMAL HIGH (ref 65–99)

## 2015-02-04 LAB — LEGIONELLA PNEUMOPHILA SEROGP 1 UR AG: L. pneumophila Serogp 1 Ur Ag: NEGATIVE

## 2015-02-04 LAB — MAGNESIUM: MAGNESIUM: 2.3 mg/dL (ref 1.7–2.4)

## 2015-02-04 MED ORDER — FOLIC ACID 1 MG PO TABS
1.0000 mg | ORAL_TABLET | Freq: Every day | ORAL | Status: DC
  Administered 2015-02-05 – 2015-02-11 (×7): 1 mg via ORAL
  Filled 2015-02-04 (×7): qty 1

## 2015-02-04 MED ORDER — ATROPINE SULFATE 0.1 MG/ML IJ SOLN
INTRAMUSCULAR | Status: AC
  Filled 2015-02-04: qty 10

## 2015-02-04 MED ORDER — ATORVASTATIN CALCIUM 10 MG PO TABS
10.0000 mg | ORAL_TABLET | Freq: Every day | ORAL | Status: DC
  Administered 2015-02-04 – 2015-02-10 (×7): 10 mg via ORAL
  Filled 2015-02-04 (×8): qty 1

## 2015-02-04 MED ORDER — PANTOPRAZOLE SODIUM 40 MG PO TBEC
40.0000 mg | DELAYED_RELEASE_TABLET | Freq: Every day | ORAL | Status: DC
  Administered 2015-02-04 – 2015-02-11 (×8): 40 mg via ORAL
  Filled 2015-02-04 (×8): qty 1

## 2015-02-04 MED ORDER — DEXTROSE 5 % IV SOLN
500.0000 mg | INTRAVENOUS | Status: AC
  Administered 2015-02-04 – 2015-02-05 (×2): 500 mg via INTRAVENOUS
  Filled 2015-02-04 (×3): qty 500

## 2015-02-04 MED ORDER — FUROSEMIDE 10 MG/ML IJ SOLN
60.0000 mg | Freq: Two times a day (BID) | INTRAMUSCULAR | Status: DC
  Administered 2015-02-04 – 2015-02-05 (×2): 60 mg via INTRAVENOUS
  Filled 2015-02-04 (×5): qty 6

## 2015-02-04 MED ORDER — AMANTADINE HCL 100 MG PO CAPS
100.0000 mg | ORAL_CAPSULE | Freq: Three times a day (TID) | ORAL | Status: DC
  Administered 2015-02-04 – 2015-02-11 (×22): 100 mg via ORAL
  Filled 2015-02-04 (×24): qty 1

## 2015-02-04 MED ORDER — RISPERIDONE 0.5 MG PO TABS
0.5000 mg | ORAL_TABLET | Freq: Two times a day (BID) | ORAL | Status: DC
  Administered 2015-02-04 – 2015-02-05 (×3): 0.5 mg via ORAL
  Filled 2015-02-04 (×5): qty 1

## 2015-02-04 MED ORDER — VITAMIN B-1 100 MG PO TABS
100.0000 mg | ORAL_TABLET | Freq: Every day | ORAL | Status: DC
  Administered 2015-02-05 – 2015-02-11 (×7): 100 mg via ORAL
  Filled 2015-02-04 (×7): qty 1

## 2015-02-04 MED ORDER — METHYLPREDNISOLONE SODIUM SUCC 40 MG IJ SOLR
20.0000 mg | Freq: Two times a day (BID) | INTRAMUSCULAR | Status: DC
  Administered 2015-02-05: 20 mg via INTRAVENOUS
  Filled 2015-02-04 (×3): qty 0.5

## 2015-02-04 MED ORDER — CETYLPYRIDINIUM CHLORIDE 0.05 % MT LIQD
7.0000 mL | Freq: Two times a day (BID) | OROMUCOSAL | Status: DC
  Administered 2015-02-04 – 2015-02-11 (×11): 7 mL via OROMUCOSAL

## 2015-02-04 MED ORDER — AMANTADINE HCL 100 MG PO CAPS
100.0000 mg | ORAL_CAPSULE | Freq: Three times a day (TID) | ORAL | Status: DC
  Filled 2015-02-04 (×2): qty 1

## 2015-02-04 NOTE — Progress Notes (Signed)
Initial Nutrition Assessment  DOCUMENTATION CODES:   Severe malnutrition in context of chronic illness, Underweight  INTERVENTION:    Diet advancement per MD/RN. Once advanced, recommend adding Ensure Enlive po BID, each supplement provides 350 kcal and 20 grams of protein.  NUTRITION DIAGNOSIS:   Malnutrition related to acute illness as evidenced by percent weight loss, severe depletion of muscle mass (12% weight loss within one month).  GOAL:   Patient will meet greater than or equal to 90% of their needs  MONITOR:   Labs, Weight trends, Diet advancement, PO intake  REASON FOR ASSESSMENT:   Consult Enteral/tube feeding initiation and management  ASSESSMENT:   69 y/o M, smoker, with PMH of schizophrenia who was admitted to [facility] on 10/1 with SOB. Found to be hypoxic (86% on RA), concern for CAP, elevated troponin and AKI. Admitted per [facility]. He decompensated requiring intubation 10/3 and was transferred to [facility] for further evaluation.   Received MD Consult for TF initiation and management. Patient self-extubated this AM. RN reports that he passed his RN swallow screen and is hopeful that his diet can be advanced today when physician rounds. Nutrition-Focused physical exam completed. Findings are mild-moderate fat depletion, severe muscle depletion, and no edema. Patient and his family member in room reports that patient has been eating very poorly for the past 2-3 weeks and has lost 15-20 lbs of his usual weight.  Diet Order:  Diet NPO time specified  Skin:  Reviewed, no issues  Last BM:  10/1  Height:   Ht Readings from Last 1 Encounters:  02/03/15  (1.702 m)    Weight:   Wt Readings from Last 1 Encounters:  02/04/15 115 lb 15.4 oz (52.6 kg)    Ideal Body Weight:  67.3 kg  BMI:  Body mass index is 18.16 kg/(m^2).  Estimated Nutritional Needs:   Kcal:  1650-1850  Protein:  75-90 gm  Fluid:  1.6-1.8 L  EDUCATION NEEDS:   No education needs  identified at this time  Joaquin Courts, RD, LDN, CNSC Pager [phone] After Hours Pager [phone]

## 2015-02-04 NOTE — Progress Notes (Signed)
  Echocardiogram 2D Echocardiogram has been performed.  Leta Jungling M 02/04/2015, 12:51 PM

## 2015-02-04 NOTE — Progress Notes (Addendum)
Pt does not wish to go on Bipap at this time. Bipap on standby if needed tonight. Vitals WNL. No distress noted. RT will continue to monitor.

## 2015-02-04 NOTE — H&P (Signed)
PULMONARY / CRITICAL CARE MEDICINE   Name: Jared Austin MRN: [id number] DOB: [birth year]    ADMISSION DATE:  02/01/2015 CONSULTATION DATE:  02/02/05  REFERRING MD :  Dr. Felecia Shelling   CHIEF COMPLAINT:  Respiratory Failure  INITIAL PRESENTATION:  69 y/o M, smoker, with PMH of schizophrenia who was admitted to [facility] on 10/1 with SOB.  Found to be hypoxic (86% on RA), concern for CAP, elevated troponin and AKI.  Admitted per [facility].  He decompensated requiring intubation 10/3 and was transferred to [facility] for further evaluation.   STUDIES:  10/03  Echo >>  SIGNIFICANT EVENTS: 10/1  Admit with SOB 10/3  Transferred to [facility] as patient is altered on mechanical ventilation.  10/3 late sel;f extubation  SUBJECTIVE: no distress this am  No BIPAP  VITAL SIGNS: Temp:  [97.6 F (36.4 C)-98.8 F (37.1 C)] 97.9 F (36.6 C) (10/04 1309) Pulse Rate:  [67-109] 68 (10/04 1200) Resp:  [17-29] 18 (10/04 1200) BP: (134-190)/(60-100) 154/61 mmHg (10/04 1200) SpO2:  [93 %-100 %] 95 % (10/04 1200) FiO2 (%):  [40 %-60 %] 45 % (10/04 0846) Weight:  [52.1 kg (114 lb 13.8 oz)-52.6 kg (115 lb 15.4 oz)] 52.6 kg (115 lb 15.4 oz) (10/04 0420)   HEMODYNAMICS:     VENTILATOR SETTINGS: Vent Mode:  [-] PCV;BIPAP FiO2 (%):  [40 %-60 %] 45 % Set Rate:  [15 bmp-22 bmp] 15 bmp Vt Set:  [410 mL-550 mL] 550 mL PEEP:  [5 cmH20] 5 cmH20 Plateau Pressure:  [20 cmH20-21 cmH20] 20 cmH20   INTAKE / OUTPUT: Intake/Output      10/03 0701 - 10/04 0700 10/04 0701 - 10/05 0700   P.O. 0    I.V. (mL/kg) 1291.2 (24.5) 46.8 (0.9)   Other 30    NG/GT 157.5    IV Piggyback 375 150   Total Intake(mL/kg) 1853.7 (35.2) 196.8 (3.7)   Urine (mL/kg/hr) 895 (0.7) 210 (0.6)   Total Output 895 210   Net +958.7 -13.2        Urine Occurrence 1 x      PHYSICAL EXAMINATION: General: thin adult male in NAD Neuro: off vent, calmer HEENT: OETT, MM pink/moist, arcus senilis  Cardiovascular: s1s2 rrr, no m/r/g Lungs: ronchi cleared,  anterior clear Abdomen: flat, soft, bsx4 active  Musculoskeletal: no acute deformities  Skin: warm/dry, no edema, rashes or lesions   LABS:  CBC  Recent Labs Lab 02/02/15 0608 02/03/15 0835 02/04/15 0318  WBC 10.8* 16.7* 11.6*  HGB 10.5* 11.6* 10.7*  HCT 30.9* 34.0* 31.7*  PLT 226 228 174   Coag's No results for input(s): APTT, INR in the last 168 hours.   BMET  Recent Labs Lab 02/02/15 0608 02/03/15 0835 02/04/15 0318  NA 139 138 137  K 3.1* 3.4* 3.8  CL 108 106 103  CO2 20* 22 25  BUN 34* 41* 52*  CREATININE 1.80* 2.00* 2.28*  GLUCOSE 144* 159* 166*   Electrolytes  Recent Labs Lab 02/02/15 0608 02/03/15 0835 02/03/15 1557 02/04/15 0318  CALCIUM 8.9 9.1  --  9.4  MG  --   --  2.4 2.3  PHOS  --   --  5.5* 4.9*   Sepsis Markers  Recent Labs Lab 02/01/15 1547  LATICACIDVEN 1.37   ABG  Recent Labs Lab 02/03/15 1257 02/03/15 1607 02/04/15 0421  PHART 7.278* 7.375 7.368  PCO2ART 52.6* 44.6 45.3*  PO2ART 367* 111.0* 74.0*   Liver Enzymes  Recent Labs Lab 02/01/15 1540 02/03/15 0835  AST 48* 34  ALT 15* 18  ALKPHOS 63 75  BILITOT 0.8 0.7  ALBUMIN 3.2* 3.0*   Cardiac Enzymes  Recent Labs Lab 02/01/15 1540 02/01/15 2118 02/03/15 0835  TROPONINI 0.07* 0.06* 0.20*   Glucose  Recent Labs Lab 02/03/15 1602 02/03/15 2351 02/04/15 0338  GLUCAP 152* 182* 176*    Imaging Dg Chest Port 1 View  02/04/2015   CLINICAL DATA:  Community-acquired pneumonia, acute respiratory failure, acute renal failure.  EXAM: PORTABLE CHEST 1 VIEW  COMPARISON:  Portable chest x-ray of February 03, 2015  FINDINGS: The lungs are well-expanded. There has been interval extubation of the trachea and of the esophagus. There remain mildly increased interstitial markings diffusely with more confluent areas in the left mid lung. The heart and pulmonary vascularity are normal. There is no pleural effusion or pneumothorax. The bony thorax is unremarkable.  IMPRESSION:  Improving interstitial infiltrates or edema. There remains asymmetrically increased parenchymal density in the left mid lung but it too has improved since yesterday's study.   Electronically Signed   By: David  [place] M.D.   On: 02/04/2015 07:16      ASSESSMENT / PLAN:  PULMONARY OETT 10/3 >> A: Acute Respiratory Failure  - concern for CAP +/- pulmonary edema Favor edema as cause, rapid resolution Hypercarbic respiratory failure Presumed COPD  Tobacco Abuse  P:   pcxr in am  Neg balance See cvs Duonebs Q6 with Q3 PRN albuterol  Solu-medrol 40 mg IV Q8, change to 20 q12h  See ID   CARDIOVASCULAR CVL  A:  Elevated troponin, suspected secondary to demand ischemia but r/o ACI Hypertension - ? Baseline hx of  This appears to be pulm edema, cause? R/o chf P:  ICU monitoring  Follow troponin  Assess ECHO- pending result Consider lasix  RENAL A:   AKI - unclear baseline Hypokalemia P:   Lasix start kvo Chem in am   GASTROINTESTINAL A:   Suspected Protein Calorie Malnutrition  P:   Start diet PPI   HEMATOLOGIC A:   Anemia dvt prev P:  Trend CBC  Heparin for DVT prophylaxis   INFECTIOUS A:   CAP unsure P:   Sputum Cx 10/3 >>   Rocephin / Azithro, start date 10/1 >> 10/3  Vanco, start date 10/3>>>10/4 Zosyn, start date 10/3>>>10/4   PCT algorithm wold help This looks like pulm edema, rapid resolution also Consider atypical course azithro restart Echo needed  ENDOCRINE A:   Hyperglycemia  P:   Monitor glucose on BMP.  If consistently > 180, add SSI   NEUROLOGIC A:   Schizophrenia and anxiety ? ETOH Abuse  P:   Precedex gtt  GOAL RASS:  -1 PRN fentanyl  Thiamine / Folate / MVI Continue amantadine Consider addition Risperdal 0.5 bid May need IV haldol  Family updated: No family available 10/3.    Interdisciplinary Family Meeting v Palliative Care Meeting:  Due by: 10/10  Ccm time 35 min   Mcarthur Rossetti. Tyson Alias, MD, FACP Pgr:  [phone] [facility]

## 2015-02-05 ENCOUNTER — Inpatient Hospital Stay (HOSPITAL_COMMUNITY): Payer: Medicare Other

## 2015-02-05 DIAGNOSIS — R7989 Other specified abnormal findings of blood chemistry: Secondary | ICD-10-CM

## 2015-02-05 LAB — PROCALCITONIN: Procalcitonin: 0.52 ng/mL

## 2015-02-05 LAB — BASIC METABOLIC PANEL
Anion gap: 12 (ref 5–15)
BUN: 57 mg/dL — AB (ref 6–20)
CALCIUM: 9.4 mg/dL (ref 8.9–10.3)
CO2: 27 mmol/L (ref 22–32)
Chloride: 100 mmol/L — ABNORMAL LOW (ref 101–111)
Creatinine, Ser: 2.32 mg/dL — ABNORMAL HIGH (ref 0.61–1.24)
GFR calc Af Amer: 32 mL/min — ABNORMAL LOW (ref >60)
GFR, EST NON AFRICAN AMERICAN: 27 mL/min — AB (ref >60)
GLUCOSE: 128 mg/dL — AB (ref 65–99)
Potassium: 3.7 mmol/L (ref 3.5–5.1)
SODIUM: 139 mmol/L (ref 135–145)

## 2015-02-05 LAB — PHOSPHORUS: PHOSPHORUS: 3.4 mg/dL (ref 2.5–4.6)

## 2015-02-05 LAB — GLUCOSE, CAPILLARY
Glucose-Capillary: 138 mg/dL — ABNORMAL HIGH (ref 65–99)
Glucose-Capillary: 146 mg/dL — ABNORMAL HIGH (ref 65–99)

## 2015-02-05 LAB — MAGNESIUM: Magnesium: 2.4 mg/dL (ref 1.7–2.4)

## 2015-02-05 MED ORDER — LABETALOL HCL 100 MG PO TABS
100.0000 mg | ORAL_TABLET | Freq: Two times a day (BID) | ORAL | Status: DC
  Administered 2015-02-05 – 2015-02-06 (×3): 100 mg via ORAL
  Filled 2015-02-05 (×4): qty 1

## 2015-02-05 MED ORDER — ACETAMINOPHEN 325 MG PO TABS
650.0000 mg | ORAL_TABLET | Freq: Four times a day (QID) | ORAL | Status: DC | PRN
  Administered 2015-02-05 – 2015-02-08 (×4): 650 mg via ORAL
  Filled 2015-02-05 (×4): qty 2

## 2015-02-05 MED ORDER — RISPERIDONE 1 MG PO TABS
0.5000 mg | ORAL_TABLET | Freq: Every day | ORAL | Status: DC
  Administered 2015-02-07 – 2015-02-10 (×5): 0.5 mg via ORAL
  Filled 2015-02-05 (×5): qty 1

## 2015-02-05 MED ORDER — PREDNISONE 20 MG PO TABS
20.0000 mg | ORAL_TABLET | Freq: Two times a day (BID) | ORAL | Status: DC
  Administered 2015-02-05 – 2015-02-06 (×2): 20 mg via ORAL
  Filled 2015-02-05 (×4): qty 1

## 2015-02-05 NOTE — Care Management Note (Signed)
Case Management Note  Patient Details  Name: Jared Austin MRN: [id number] Date of Birth: 1945-07-13  Subjective/Objective:     Patient lives at home with brother, who works day shift and is home with him in evenings and at night.  Patient states he is independent at home, but at this time he is talking about his leg???? Does not know where he is.  Nurse states weak getting out of bed.  PT ordered.                Action/Plan:   Expected Discharge Date:                  Expected Discharge Plan:  Home/Self Care  In-House Referral:     Discharge planning Services  CM Consult  Post Acute Care Choice:    Choice offered to:     DME Arranged:    DME Agency:     HH Arranged:    HH Agency:     Status of Service:  In process, will continue to follow  Medicare Important Message Given:    Date Medicare IM Given:    Medicare IM give by:    Date Additional Medicare IM Given:    Additional Medicare Important Message give by:     If discussed at Long Length of Stay Meetings, dates discussed:    Additional Comments:  Vangie Bicker, RN 02/05/2015, 2:32 PM

## 2015-02-05 NOTE — H&P (Signed)
PULMONARY / CRITICAL CARE MEDICINE   Name: Jared Austin MRN: [id number] DOB: 09-01-1945    ADMISSION DATE:  02/01/2015 CONSULTATION DATE:  02/02/05  REFERRING MD :  Dr. Felecia Shelling   CHIEF COMPLAINT:  Respiratory Failure  INITIAL PRESENTATION:  69 y/o M, smoker, with PMH of schizophrenia who was admitted to [facility] on 10/1 with SOB.  Found to be hypoxic (86% on RA), concern for CAP, elevated troponin and AKI.  Admitted per [facility].  He decompensated requiring intubation 10/3 and was transferred to [facility] for further evaluation.   STUDIES:  10/03  Echo >>  SIGNIFICANT EVENTS: 10/1  Admit with SOB 10/3  Transferred to [facility] as patient is altered on mechanical ventilation.  10/3 late sel;f extubation  SUBJECTIVE: No distress  off precedex  VITAL SIGNS: Temp:  [97.8 F (36.6 C)-98.9 F (37.2 C)] 98.9 F (37.2 C) (10/05 1159) Pulse Rate:  [71-111] 97 (10/05 1200) Resp:  [14-31] 20 (10/05 1200) BP: (117-188)/(56-101) 117/88 mmHg (10/05 1100) SpO2:  [86 %-98 %] 94 % (10/05 1333) FiO2 (%):  [45 %-50 %] 45 % (10/04 2020)   HEMODYNAMICS:     VENTILATOR SETTINGS: Vent Mode:  [-]  FiO2 (%):  [45 %-50 %] 45 %   INTAKE / OUTPUT: Intake/Output      10/04 0701 - 10/05 0700 10/05 0701 - 10/06 0700   P.O. 355    I.V. (mL/kg) 269.7 (5.1) 50 (1)   Other     NG/GT     IV Piggyback 400    Total Intake(mL/kg) 1024.7 (19.5) 50 (1)   Urine (mL/kg/hr) 2210 (1.8) 1470 (3.7)   Total Output 2210 1470   Net -1185.3 -1420          PHYSICAL EXAMINATION: General: thin adult male in NAD Neuro: nonfocal, alert HEENT:jvd wnl  Cardiovascular: s1s2 rrr, no m/r/g Lungs: clear Abdomen: flat, soft, bsx4 active  Musculoskeletal: no acute deformities  Skin: warm/dry, no edema, rashes or lesions   LABS:  CBC  Recent Labs Lab 02/02/15 0608 02/03/15 0835 02/04/15 0318  WBC 10.8* 16.7* 11.6*  HGB 10.5* 11.6* 10.7*  HCT 30.9* 34.0* 31.7*  PLT 226 228 174   Coag's No results for input(s):  APTT, INR in the last 168 hours.   BMET  Recent Labs Lab 02/03/15 0835 02/04/15 0318 02/05/15 0300  NA 138 137 139  K 3.4* 3.8 3.7  CL 106 103 100*  CO2 BUN 41* 52* 57*  CREATININE 2.00* 2.28* 2.32*  GLUCOSE 159* 166* 128*   Electrolytes  Recent Labs Lab 02/03/15 0835 02/03/15 1557 02/04/15 0318 02/05/15 0300  CALCIUM 9.1  --  9.4 9.4  MG  --  2.4 2.3 2.4  PHOS  --  5.5* 4.9* 3.4   Sepsis Markers  Recent Labs Lab 02/01/15 1547 02/04/15 1440 02/05/15 0300  LATICACIDVEN 1.37  --   --   PROCALCITON  --  0.51 0.52   ABG  Recent Labs Lab 02/03/15 1257 02/03/15 1607 02/04/15 0421  PHART 7.278* 7.375 7.368  PCO2ART 52.6* 44.6 45.3*  PO2ART 367* 111.0* 74.0*   Liver Enzymes  Recent Labs Lab 02/01/15 1540 02/03/15 0835  AST 48* 34  ALT 15* 18  ALKPHOS 63 75  BILITOT 0.8 0.7  ALBUMIN 3.2* 3.0*   Cardiac Enzymes  Recent Labs Lab 02/01/15 1540 02/01/15 2118 02/03/15 0835  TROPONINI 0.07* 0.06* 0.20*   Glucose  Recent Labs Lab 02/03/15 1602 02/03/15 2351 02/04/15 0338 02/04/15 1307 02/05/15 0009  02/05/15 0359  GLUCAP 152* 182* 176* 163* 146* 138*    Imaging Dg Chest Port 1 View  02/05/2015   CLINICAL DATA:  Pulmonary edema.  Shortness of breath.  EXAM: PORTABLE CHEST 1 VIEW  COMPARISON:  02/04/2015, 02/03/2015.  FINDINGS: Mediastinum hilar structures are normal. Heart size stable. Interim slight progression of diffuse bilateral pulmonary interstitial infiltrates. No pleural effusion or pneumothorax. No acute osseous abnormality .  IMPRESSION: Interim slight progression of diffuse bilateral pulmonary interstitial infiltrates.   Electronically Signed   By: Maisie Fus  Register   On: 02/05/2015 07:10      ASSESSMENT / PLAN:  PULMONARY OETT 10/3 >> A: Acute Respiratory Failure  -favor pulmonary edema Favor edema as cause, rapid resolution Hypercarbic respiratory failure Presumed COPD  Tobacco Abuse  P:   Neg balance successful  and improved resp status Duonebs Q6 with Q3 PRN albuterol  Change steroids to pred  See ID   CARDIOVASCULAR CVL  A:  Elevated troponin, suspected secondary to demand ischemia but r/o ACI Hypertension - ? Baseline hx of  This appears to be pulm edema associated with pulm htn ( copd related?) P:  To tele   Assess ECHO - reviewed PA pressure up Maintain lasix  RENAL A:   AKI - unclear baseline Hypokalemia P:   Lasix hold further, follow trebd kvo Chem in am   GASTROINTESTINAL A:   Suspected Protein Calorie Malnutrition  P:   diet BM noted PPI   HEMATOLOGIC A:   Anemia dvt prev P:  Trend CBC  Heparin for DVT prophylaxis   INFECTIOUS A:   CAP unsure P:   Sputum Cx 10/3 >>   Rocephin / Azithro, start date 10/1 >> 10/3  Vanco, start date 10/3>>>10/4 Zosyn, start date 10/3>>>10/4   PCT algorithm re assuring This looks like pulm edema, rapid resolution also Consider atypical course azithro , x 1 more day then allow to dc  ENDOCRINE A:   Hyperglycemia  P:   Monitor glucose on BMP.  If consistently > 180, add SSI   NEUROLOGIC A:   Schizophrenia and anxiety ? ETOH Abuse  P:   GOAL RASS: 0 PRN fentanyl  Thiamine / Folate / MVI Continue amantadine IMproved with Risperdal 0.5, change to qhs  Family updated: No family available 10/3.    Interdisciplinary Family Meeting v Palliative Care Meeting:  Due by: 10/10  To traid floor  Mcarthur Rossetti. Tyson Alias, MD, FACP Pgr: [phone] [facility]

## 2015-02-05 NOTE — Progress Notes (Signed)
Patient arrived to the unit at 5:20 pm via wheechair with RN. Patient's stable. Orientation given to the unit, Patient verbalizes understanding. Denies any pain at this time.

## 2015-02-06 DIAGNOSIS — J9602 Acute respiratory failure with hypercapnia: Secondary | ICD-10-CM | POA: Diagnosis present

## 2015-02-06 DIAGNOSIS — J810 Acute pulmonary edema: Secondary | ICD-10-CM | POA: Diagnosis present

## 2015-02-06 DIAGNOSIS — N179 Acute kidney failure, unspecified: Secondary | ICD-10-CM | POA: Diagnosis present

## 2015-02-06 DIAGNOSIS — F209 Schizophrenia, unspecified: Secondary | ICD-10-CM | POA: Diagnosis present

## 2015-02-06 DIAGNOSIS — Z720 Tobacco use: Secondary | ICD-10-CM | POA: Diagnosis present

## 2015-02-06 DIAGNOSIS — I272 Pulmonary hypertension, unspecified: Secondary | ICD-10-CM | POA: Diagnosis present

## 2015-02-06 DIAGNOSIS — J441 Chronic obstructive pulmonary disease with (acute) exacerbation: Secondary | ICD-10-CM | POA: Diagnosis present

## 2015-02-06 DIAGNOSIS — I5032 Chronic diastolic (congestive) heart failure: Secondary | ICD-10-CM | POA: Diagnosis present

## 2015-02-06 DIAGNOSIS — I10 Essential (primary) hypertension: Secondary | ICD-10-CM | POA: Diagnosis present

## 2015-02-06 DIAGNOSIS — F411 Generalized anxiety disorder: Secondary | ICD-10-CM | POA: Diagnosis present

## 2015-02-06 LAB — LACTATE DEHYDROGENASE: LDH: 383 U/L — AB (ref 98–192)

## 2015-02-06 LAB — BASIC METABOLIC PANEL
ANION GAP: 11 (ref 5–15)
BUN: 65 mg/dL — AB (ref 6–20)
CHLORIDE: 101 mmol/L (ref 101–111)
CO2: 30 mmol/L (ref 22–32)
Calcium: 9.4 mg/dL (ref 8.9–10.3)
Creatinine, Ser: 2.27 mg/dL — ABNORMAL HIGH (ref 0.61–1.24)
GFR calc Af Amer: 32 mL/min — ABNORMAL LOW (ref >60)
GFR calc non Af Amer: 28 mL/min — ABNORMAL LOW (ref >60)
GLUCOSE: 104 mg/dL — AB (ref 65–99)
POTASSIUM: 4.3 mmol/L (ref 3.5–5.1)
Sodium: 142 mmol/L (ref 135–145)

## 2015-02-06 LAB — CULTURE, RESPIRATORY

## 2015-02-06 LAB — RAPID URINE DRUG SCREEN, HOSP PERFORMED
Amphetamines: NOT DETECTED
BARBITURATES: NOT DETECTED
BENZODIAZEPINES: NOT DETECTED
COCAINE: NOT DETECTED
Opiates: NOT DETECTED
TETRAHYDROCANNABINOL: NOT DETECTED

## 2015-02-06 LAB — CULTURE, RESPIRATORY W GRAM STAIN

## 2015-02-06 LAB — TROPONIN I: TROPONIN I: 0.31 ng/mL — AB (ref <0.031)

## 2015-02-06 LAB — RETICULOCYTES
RBC.: 3.69 MIL/uL — ABNORMAL LOW (ref 4.22–5.81)
Retic Count, Absolute: 107 10*3/uL (ref 19.0–186.0)
Retic Ct Pct: 2.9 % (ref 0.4–3.1)

## 2015-02-06 LAB — CBC
HEMATOCRIT: 32 % — AB (ref 39.0–52.0)
HEMOGLOBIN: 10.7 g/dL — AB (ref 13.0–17.0)
MCH: 30.1 pg (ref 26.0–34.0)
MCHC: 33.4 g/dL (ref 30.0–36.0)
MCV: 90.1 fL (ref 78.0–100.0)
Platelets: 172 10*3/uL (ref 150–400)
RBC: 3.55 MIL/uL — ABNORMAL LOW (ref 4.22–5.81)
RDW: 13.9 % (ref 11.5–15.5)
WBC: 12.4 10*3/uL — ABNORMAL HIGH (ref 4.0–10.5)

## 2015-02-06 LAB — IRON AND TIBC
IRON: 66 ug/dL (ref 45–182)
Saturation Ratios: 26 % (ref 17.9–39.5)
TIBC: 251 ug/dL (ref 250–450)
UIBC: 185 ug/dL

## 2015-02-06 LAB — FOLATE: FOLATE: 18.1 ng/mL (ref >5.9)

## 2015-02-06 LAB — VITAMIN B12: Vitamin B-12: 777 pg/mL (ref 180–914)

## 2015-02-06 LAB — FERRITIN: FERRITIN: 352 ng/mL — AB (ref 24–336)

## 2015-02-06 LAB — PROCALCITONIN: Procalcitonin: 0.3 ng/mL

## 2015-02-06 MED ORDER — NICOTINE 21 MG/24HR TD PT24
21.0000 mg | MEDICATED_PATCH | Freq: Every day | TRANSDERMAL | Status: DC
  Administered 2015-02-06 – 2015-02-11 (×6): 21 mg via TRANSDERMAL
  Filled 2015-02-06 (×6): qty 1

## 2015-02-06 MED ORDER — METOPROLOL TARTRATE 50 MG PO TABS
50.0000 mg | ORAL_TABLET | Freq: Two times a day (BID) | ORAL | Status: DC
  Administered 2015-02-07 – 2015-02-11 (×10): 50 mg via ORAL
  Filled 2015-02-06 (×10): qty 1

## 2015-02-06 MED ORDER — ISOSORBIDE MONONITRATE 10 MG PO TABS
10.0000 mg | ORAL_TABLET | Freq: Two times a day (BID) | ORAL | Status: DC
  Administered 2015-02-07 – 2015-02-10 (×8): 10 mg via ORAL
  Filled 2015-02-06 (×11): qty 1

## 2015-02-06 MED ORDER — ENSURE ENLIVE PO LIQD
237.0000 mL | Freq: Three times a day (TID) | ORAL | Status: DC
  Administered 2015-02-06 – 2015-02-11 (×13): 237 mL via ORAL

## 2015-02-06 MED ORDER — PREDNISONE 20 MG PO TABS
20.0000 mg | ORAL_TABLET | Freq: Every day | ORAL | Status: DC
Start: 2015-02-07 — End: 2015-02-07
  Administered 2015-02-07: 20 mg via ORAL
  Filled 2015-02-06: qty 1

## 2015-02-06 MED ORDER — IPRATROPIUM-ALBUTEROL 0.5-2.5 (3) MG/3ML IN SOLN
3.0000 mL | Freq: Three times a day (TID) | RESPIRATORY_TRACT | Status: DC
  Administered 2015-02-06 – 2015-02-08 (×4): 3 mL via RESPIRATORY_TRACT
  Filled 2015-02-06 (×7): qty 3

## 2015-02-06 NOTE — Progress Notes (Signed)
PT Cancellation Note  Patient Details Name: Jared Austin MRN: [id number] DOB: Jun 09, 1945   Cancelled Treatment:    Reason Eval/Treat Not Completed: Medical issues which prohibited therapy (Patient currently on bed rest.) Notified MD to clarify activity orders, will check back and evaluate as time permits. Thank you,   Michele Rockers, SPT [phone] 02/06/2015, 7:53 AM

## 2015-02-06 NOTE — Care Management Important Message (Signed)
Important Message  Patient Details  Name: Jared Austin MRN: [id number] Date of Birth: 05-Aug-1945   Medicare Important Message Given:  Yes-second notification given    Orson Aloe 02/06/2015, 12:56 PM

## 2015-02-06 NOTE — Progress Notes (Signed)
[place] TEAM 1 - Stepdown/ICU TEAM Progress Note  Jared Austin ZOX:[id number] DOB: Feb 19, 1946 DOA: 02/01/2015 PCP: Avon Gully, MD  Admit HPI / Brief Narrative: 70 y/o BM PMHx COPD (not on home O2), Current Smoker, Schizophrenia, Anxiety, Alcohol Abuse, HLD.   Admitted to [facility] on 10/1 with SOB. Found to be hypoxic (86% on RA), concern for CAP, elevated troponin and AKI. Admitted per [facility]. He decompensated requiring intubation 10/3 and was transferred to [facility] for further evaluation   HPI/Subjective: 10/6 A/O 4, negative CP, negative SOB, negative N/V. Niece states that patient smokes 2-3 PPD of cigarettes.     Assessment/Plan: Acute Respiratory Failure with hypercarbia/Pulmonary Edema -Resolved  -Obtain ambulatory SPO2  COPD Exacerbation  -Counseled patient extensively on the sequela of continuing to smoke. -Patient has agreed to stop smoking will start nicotine patch  Tobacco Abuse  -See COPD exacerbation -Duonebs TID  -Decrease prednisone to 20 mg daily   Chronic diastolic CHF -metoprolol 50 mg BID -Isosorbide mononitrate 10 mg BID  Pulmonary hypertension -See chronic diastolic CHF  Elevated troponin, most likely demand ischemia but r/o ACI -Trend troponin  Hypertension  -See chronic diastolic CHF  AKI - unclear baseline secondary to no information. -Slowly improving -Avoid nephrotoxic medication  Hypokalemia -Potassium goal> 4 -WNL  Protein Calorie Malnutrition  -Encourage patient to eat -Ensure TID  Anemia -Anemia panel pending  Hx DVT -Subcutaneous heparin for prophylaxis  Schizophrenia and anxiety -Amantadine 100 mg TID -Risperdal 0.5 mg QHS   ? ETOH Abuse  -ContinueThiamine / Folate / MVI   Code Status: FULL Family Communication: Niece present at time of exam Disposition Plan: Home 24-48 hours    Consultants: Dr. Nelda Bucks [facility] M)    Procedure/Significant Events: 10/03 Echo >> 10/1 Admit with SOB 10/3  Transferred to [facility] as patient is altered on mechanical ventilation.  10/3 late self extubation 10/4 echocardiogram;- LVEF= 50% to 55%. -(grade 2 diastolic dysfunction). - Pulmonary arteries: Systolic pressure PA peak pressure: 66 mm Hg (S).  Culture Sputum Cx 10/3 >>   Antibiotics: Rocephin / Azithro, start date 10/1 >> 10/3  Vanco, start date 10/3>>>10/4 Zosyn, start date 10/3>>>10/4    DVT prophylaxis: Subcutaneous heparin   Devices    LINES / TUBES:      Continuous Infusions: . sodium chloride 10 mL/hr at 02/05/15 2140    Objective: VITAL SIGNS: Temp: 98.4 F (36.9 C) (10/06 1642) Temp Source: Oral (10/06 1642) BP: 173/80 mmHg (10/06 1642) Pulse Rate: 76 (10/06 1642) SPO2; FIO2:   Intake/Output Summary (Last 24 hours) at 02/06/15 1857 Last data filed at 02/06/15 1828  Gross per 24 hour  Intake 916.67 ml  Output   1625 ml  Net -708.33 ml     Exam: General: A/O 4, NAD, No acute respiratory distress Eyes: Negative headache, eye pain, double vision,negative scleral hemorrhage ENT: Negative Runny nose, negative ear pain, negative gingival bleeding, Neck:  Negative scars, masses, torticollis, lymphadenopathy, JVD Lungs: Clear to auscultation bilaterally without wheezes or crackles Cardiovascular: Regular rate and rhythm without murmur gallop or rub normal S1 and S2 Abdomen:negative abdominal pain, nondistended, positive soft, bowel sounds, no rebound, no ascites, no appreciable mass Extremities: No significant cyanosis, clubbing, or edema bilateral lower extremities Psychiatric:  Negative depression, negative anxiety, negative fatigue, negative mania Neurologic:  Cranial nerves II through XII intact, tongue/uvula midline, all extremities muscle strength 5/5, sensation intact throughout, negative dysarthria, negative expressive aphasia, negative receptive aphasia.   Data Reviewed: Basic Metabolic Panel:  Recent Labs  Lab 02/02/15 0608  02/03/15 0835 02/03/15 1557 02/04/15 0318 02/05/15 0300 02/06/15 0521  NA 139 138  --  137 139 142  K 3.1* 3.4*  --  3.8 3.7 4.3  CL 108 106  --  103 100* 101  CO2 20* 22  --  GLUCOSE 144* 159*  --  166* 128* 104*  BUN 34* 41*  --  52* 57* 65*  CREATININE 1.80* 2.00*  --  2.28* 2.32* 2.27*  CALCIUM 8.9 9.1  --  9.4 9.4 9.4  MG  --   --  2.4 2.3 2.4  --   PHOS  --   --  5.5* 4.9* 3.4  --    Liver Function Tests:  Recent Labs Lab 02/01/15 1540 02/03/15 0835  AST 48* 34  ALT 15* 18  ALKPHOS 63 75  BILITOT 0.8 0.7  PROT 6.9 6.9  ALBUMIN 3.2* 3.0*   No results for input(s): LIPASE, AMYLASE in the last 168 hours.  Recent Labs Lab 02/03/15 0835  AMMONIA 29   CBC:  Recent Labs Lab 02/01/15 1540 02/02/15 0608 02/03/15 0835 02/04/15 0318 02/06/15 0521  WBC 12.5* 10.8* 16.7* 11.6* 12.4*  NEUTROABS 11.5*  --  15.8*  --   --   HGB 10.4* 10.5* 11.6* 10.7* 10.7*  HCT 30.5* 30.9* 34.0* 31.7* 32.0*  MCV 89.2 89.6 90.7 90.3 90.1  PLT 256 226 228 174 172   Cardiac Enzymes:  Recent Labs Lab 02/01/15 1540 02/01/15 2118 02/03/15 0835  TROPONINI 0.07* 0.06* 0.20*   BNP (last 3 results)  Recent Labs  02/01/15 1530 02/03/15 0835  BNP 222.0* 602.0*    ProBNP (last 3 results) No results for input(s): PROBNP in the last 8760 hours.  CBG:  Recent Labs Lab 02/03/15 2351 02/04/15 0338 02/04/15 1307 02/05/15 0009 02/05/15 0359  GLUCAP 182* 176* 163* 146* 138*    Recent Results (from the past 240 hour(s))  Culture, respiratory (NON-Expectorated)     Status: None   Collection Time: 02/03/15 12:39 PM  Result Value Ref Range Status   Specimen Description TRACHEAL ASPIRATE  Final   Special Requests NONE  Final   Gram Stain   Final    RARE WBC PRESENT,BOTH PMN AND MONONUCLEAR NO SQUAMOUS EPITHELIAL CELLS SEEN NO ORGANISMS SEEN Performed at [facility]    Culture   Final    RARE YEAST CONSISTENT WITH CANDIDA SPECIES Performed at [facility]    Report Status 02/06/2015 FINAL  Final  MRSA PCR Screening     Status: None   Collection Time: 02/03/15  3:00 PM  Result Value Ref Range Status   MRSA by PCR NEGATIVE NEGATIVE Final    Comment:        The GeneXpert MRSA Assay (FDA approved for NASAL specimens only), is one component of a comprehensive MRSA colonization surveillance program. It is not intended to diagnose MRSA infection nor to guide or monitor treatment for MRSA infections.   Culture, respiratory (NON-Expectorated)     Status: None   Collection Time: 02/03/15  3:15 PM  Result Value Ref Range Status   Specimen Description ENDOTRACHEAL  Final   Special Requests NONE  Final   Gram Stain   Final    RARE WBC PRESENT, PREDOMINANTLY PMN RARE SQUAMOUS EPITHELIAL CELLS PRESENT NO ORGANISMS SEEN Performed at [facility]    Culture   Final    RARE CANDIDA ALBICANS Performed at [facility]    Report  Status 02/06/2015 FINAL  Final  MRSA PCR Screening     Status: None   Collection Time: 02/03/15  4:45 PM  Result Value Ref Range Status   MRSA by PCR NEGATIVE NEGATIVE Final    Comment:        The GeneXpert MRSA Assay (FDA approved for NASAL specimens only), is one component of a comprehensive MRSA colonization surveillance program. It is not intended to diagnose MRSA infection nor to guide or monitor treatment for MRSA infections.      Studies:  Recent x-ray studies have been reviewed in detail by the Attending Physician  Scheduled Meds:  Scheduled Meds: . amantadine  100 mg Oral TID  . antiseptic oral rinse  7 mL Mouth Rinse BID  . atorvastatin  10 mg Oral q1800  . feeding supplement (ENSURE ENLIVE)  237 mL Oral TID BM  . folic acid  1 mg Oral Daily  . heparin subcutaneous  5,000 Units Subcutaneous 3 times per day  . ipratropium-albuterol  3 mL Nebulization TID  . isosorbide mononitrate  10 mg Oral BID  . metoprolol tartrate  50 mg Oral BID  . nicotine  21 mg  Transdermal Daily  . pantoprazole  40 mg Oral Daily  . [START ON 02/07/2015] predniSONE  20 mg Oral Q breakfast  . risperiDONE  0.5 mg Oral QHS  . thiamine  100 mg Oral Daily    Time spent on care of this patient: 40 mins   WOODS, Roselind Messier , MD  [facility] Office  [phone] Pager [phone]  On-Call/Text Page:      [email or web address]      password [email or web address]  If 7PM-7AM, please contact night-coverage [email or web address] Password [email or web address] 02/06/2015, 6:57 PM   LOS: 5 days   Care during the described time interval was provided by me .  I have reviewed this patient's available data, including medical history, events of note, physical examination, and all test results as part of my evaluation. I have personally reviewed and interpreted all radiology studies.   Carolyne Littles, MD [phone] Pager

## 2015-02-07 DIAGNOSIS — I249 Acute ischemic heart disease, unspecified: Secondary | ICD-10-CM | POA: Diagnosis present

## 2015-02-07 DIAGNOSIS — F2089 Other schizophrenia: Secondary | ICD-10-CM

## 2015-02-07 LAB — CBC WITH DIFFERENTIAL/PLATELET
BASOS ABS: 0 10*3/uL (ref 0.0–0.1)
BASOS PCT: 0 %
EOS ABS: 0.1 10*3/uL (ref 0.0–0.7)
EOS PCT: 2 %
HCT: 32 % — ABNORMAL LOW (ref 39.0–52.0)
Hemoglobin: 10.6 g/dL — ABNORMAL LOW (ref 13.0–17.0)
Lymphocytes Relative: 9 %
Lymphs Abs: 0.8 10*3/uL (ref 0.7–4.0)
MCH: 30.2 pg (ref 26.0–34.0)
MCHC: 33.1 g/dL (ref 30.0–36.0)
MCV: 91.2 fL (ref 78.0–100.0)
Monocytes Absolute: 0.6 10*3/uL (ref 0.1–1.0)
Monocytes Relative: 7 %
NEUTROS PCT: 83 %
Neutro Abs: 7.7 10*3/uL (ref 1.7–7.7)
PLATELETS: 158 10*3/uL (ref 150–400)
RBC: 3.51 MIL/uL — AB (ref 4.22–5.81)
RDW: 14.4 % (ref 11.5–15.5)
WBC: 9.3 10*3/uL (ref 4.0–10.5)

## 2015-02-07 LAB — COMPREHENSIVE METABOLIC PANEL
ALT: 13 U/L — AB (ref 17–63)
AST: 18 U/L (ref 15–41)
Albumin: 2.6 g/dL — ABNORMAL LOW (ref 3.5–5.0)
Alkaline Phosphatase: 56 U/L (ref 38–126)
Anion gap: 11 (ref 5–15)
BILIRUBIN TOTAL: 1 mg/dL (ref 0.3–1.2)
BUN: 60 mg/dL — AB (ref 6–20)
CALCIUM: 8.8 mg/dL — AB (ref 8.9–10.3)
CO2: 30 mmol/L (ref 22–32)
CREATININE: 2.1 mg/dL — AB (ref 0.61–1.24)
Chloride: 98 mmol/L — ABNORMAL LOW (ref 101–111)
GFR, EST AFRICAN AMERICAN: 36 mL/min — AB (ref >60)
GFR, EST NON AFRICAN AMERICAN: 31 mL/min — AB (ref >60)
Glucose, Bld: 84 mg/dL (ref 65–99)
Potassium: 4.2 mmol/L (ref 3.5–5.1)
Sodium: 139 mmol/L (ref 135–145)
TOTAL PROTEIN: 5.6 g/dL — AB (ref 6.5–8.1)

## 2015-02-07 LAB — HAPTOGLOBIN: Haptoglobin: 228 mg/dL — ABNORMAL HIGH (ref 34–200)

## 2015-02-07 LAB — MAGNESIUM: MAGNESIUM: 2.5 mg/dL — AB (ref 1.7–2.4)

## 2015-02-07 LAB — TROPONIN I
TROPONIN I: 0.27 ng/mL — AB (ref <0.031)
TROPONIN I: 0.28 ng/mL — AB (ref <0.031)

## 2015-02-07 MED ORDER — BOOST / RESOURCE BREEZE PO LIQD
1.0000 | ORAL | Status: DC
  Administered 2015-02-08 – 2015-02-11 (×3): 1 via ORAL

## 2015-02-07 MED ORDER — PREDNISONE 10 MG PO TABS: 10.0000 mg | ORAL_TABLET | Freq: Every day | ORAL | Status: DC

## 2015-02-07 MED ORDER — ADULT MULTIVITAMIN W/MINERALS CH
1.0000 | ORAL_TABLET | Freq: Every day | ORAL | Status: DC
  Administered 2015-02-07 – 2015-02-11 (×5): 1 via ORAL
  Filled 2015-02-07 (×5): qty 1

## 2015-02-07 NOTE — Progress Notes (Signed)
[place] TEAM 1 - Stepdown/ICU TEAM Progress Note  Jared Austin ZOX:[id number] DOB: [birth year] DOA: 02/01/2015 PCP: Avon Gully, MD  Admit HPI / Brief Narrative: 69 y/o BM PMHx COPD (not on home O2), Current Smoker, Schizophrenia, Anxiety, Alcohol Abuse, HLD.   Admitted to [facility] on 10/1 with SOB. Found to be hypoxic (86% on RA), concern for CAP, elevated troponin and AKI. Admitted per [facility]. He decompensated requiring intubation 10/3 and was transferred to [facility] for further evaluation   HPI/Subjective: 10/7 A/O 4, negative CP, negative SOB, negative N/V. smokes 2-3 PPD of cigarettes.    Assessment/Plan: Acute Respiratory Failure with hypercarbia/Pulmonary Edema -Resolved  -Obtain ambulatory SPO2; DME setup for home O2, patient will need 2 L O2 at rest, and 3 L O2 during exertion  COPD Exacerbation  -Counseled patient extensively on the sequela of continuing to smoke. -Patient has agreed to stop smoking will start nicotine patch  Tobacco Abuse  -See COPD exacerbation -Duonebs TID  -Decrease prednisone to 10 mg daily   Chronic diastolic CHF -metoprolol 50 mg BID -Isosorbide mononitrate 10 mg BID  Pulmonary hypertension -See chronic diastolic CHF  Elevated troponin, continue elevation -Troponins continue to be elevated given patient has a HEART score= 4 have consult cardiology for stress test prior to discharge -If stress test positive will proceed to cardiac catheterization  Hypertension  -See chronic diastolic CHF  AKI - unclear baseline secondary to no information. -Slowly improving -Avoid nephrotoxic medication; unfortunately may require cardiac catheterization use contrast protocol for renal failure  Hypokalemia -Potassium goal> 4 -WNL  Protein Calorie Malnutrition  -Encourage patient to eat -Ensure TID  Anemia -Anemia panel pending  Hx DVT -Subcutaneous heparin for prophylaxis  Schizophrenia and anxiety -Amantadine 100 mg TID -Risperdal 0.5  mg QHS   ? ETOH Abuse  -ContinueThiamine / Folate / MVI   Code Status: FULL Family Communication: Niece and brother present at time of exam Disposition Plan: Home 24-48 hours    Consultants: Dr. Nelda Bucks [facility] M)  Dr. Orpah Cobb (cardiology)    Procedure/Significant Events: 10/03 Echo >> 10/1 Admit with SOB 10/3 Transferred to [facility] as patient is altered on mechanical ventilation.  10/3 late self extubation 10/4 echocardiogram;- LVEF= 50% to 55%. -(grade 2 diastolic dysfunction). - Pulmonary arteries: Systolic pressure PA peak pressure: 66 mm Hg (S).  Culture Sputum Cx 10/3 >>   Antibiotics: Rocephin / Azithro, start date 10/1 >> 10/3  Vanco, start date 10/3>>>10/4 Zosyn, start date 10/3>>>10/4    DVT prophylaxis: Subcutaneous heparin   Devices    LINES / TUBES:      Continuous Infusions: . sodium chloride 10 mL/hr at 02/05/15 2140    Objective: VITAL SIGNS: Temp: 98.1 F (36.7 C) (10/07 1141) Temp Source: Oral (10/07 1141) BP: 143/72 mmHg (10/07 1141) Pulse Rate: 65 (10/07 1141) SPO2; FIO2:   Intake/Output Summary (Last 24 hours) at 02/07/15 1928 Last data filed at 02/07/15 1428  Gross per 24 hour  Intake    480 ml  Output   1075 ml  Net   -595 ml     Exam: General: A/O 4, NAD, No acute respiratory distress Eyes: Negative headache, eye pain, double vision,negative scleral hemorrhage ENT: Negative Runny nose, negative ear pain, negative gingival bleeding, Neck:  Negative scars, masses, torticollis, lymphadenopathy, JVD Lungs: Clear to auscultation bilaterally without wheezes or crackles Cardiovascular: Regular rate and rhythm without murmur gallop or rub normal S1 and S2 Abdomen:negative abdominal pain, nondistended, positive soft, bowel sounds, no rebound, no  ascites, no appreciable mass Extremities: No significant cyanosis, clubbing, or edema bilateral lower extremities Psychiatric:  Negative depression, negative  anxiety, negative fatigue, negative mania Neurologic:  Cranial nerves II through XII intact, tongue/uvula midline, all extremities muscle strength 5/5, sensation intact throughout, negative dysarthria, negative expressive aphasia, negative receptive aphasia.   Data Reviewed: Basic Metabolic Panel:  Recent Labs Lab 02/03/15 0835 02/03/15 1557 02/04/15 0318 02/05/15 0300 02/06/15 0521 02/07/15 0757  NA 138  --  137 139 142 139  K 3.4*  --  3.8 3.7 4.3 4.2  CL 106  --  103 100* 101 98*  CO2 22  --  GLUCOSE 159*  --  166* 128* 104* 84  BUN 41*  --  52* 57* 65* 60*  CREATININE 2.00*  --  2.28* 2.32* 2.27* 2.10*  CALCIUM 9.1  --  9.4 9.4 9.4 8.8*  MG  --  2.4 2.3 2.4  --  2.5*  PHOS  --  5.5* 4.9* 3.4  --   --    Liver Function Tests:  Recent Labs Lab 02/01/15 1540 02/03/15 0835 02/07/15 0757  AST 48* 34 18  ALT 15* 18 13*  ALKPHOS 63 75 56  BILITOT 0.8 0.7 1.0  PROT 6.9 6.9 5.6*  ALBUMIN 3.2* 3.0* 2.6*   No results for input(s): LIPASE, AMYLASE in the last 168 hours.  Recent Labs Lab 02/03/15 0835  AMMONIA 29   CBC:  Recent Labs Lab 02/01/15 1540 02/02/15 0608 02/03/15 0835 02/04/15 0318 02/06/15 0521 02/07/15 0757  WBC 12.5* 10.8* 16.7* 11.6* 12.4* 9.3  NEUTROABS 11.5*  --  15.8*  --   --  7.7  HGB 10.4* 10.5* 11.6* 10.7* 10.7* 10.6*  HCT 30.5* 30.9* 34.0* 31.7* 32.0* 32.0*  MCV 89.2 89.6 90.7 90.3 90.1 91.2  PLT 256 226 228 174 172 158   Cardiac Enzymes:  Recent Labs Lab 02/01/15 2118 02/03/15 0835 02/06/15 1930 02/07/15 0118 02/07/15 0757  TROPONINI 0.06* 0.20* 0.31* 0.27* 0.28*   BNP (last 3 results)  Recent Labs  02/01/15 1530 02/03/15 0835  BNP 222.0* 602.0*    ProBNP (last 3 results) No results for input(s): PROBNP in the last 8760 hours.  CBG:  Recent Labs Lab 02/03/15 2351 02/04/15 0338 02/04/15 1307 02/05/15 0009 02/05/15 0359  GLUCAP 182* 176* 163* 146* 138*    Recent Results (from the past 240  hour(s))  Culture, respiratory (NON-Expectorated)     Status: None   Collection Time: 02/03/15 12:39 PM  Result Value Ref Range Status   Specimen Description TRACHEAL ASPIRATE  Final   Special Requests NONE  Final   Gram Stain   Final    RARE WBC PRESENT,BOTH PMN AND MONONUCLEAR NO SQUAMOUS EPITHELIAL CELLS SEEN NO ORGANISMS SEEN Performed at [facility]    Culture   Final    RARE YEAST CONSISTENT WITH CANDIDA SPECIES Performed at [facility]    Report Status 02/06/2015 FINAL  Final  MRSA PCR Screening     Status: None   Collection Time: 02/03/15  3:00 PM  Result Value Ref Range Status   MRSA by PCR NEGATIVE NEGATIVE Final    Comment:        The GeneXpert MRSA Assay (FDA approved for NASAL specimens only), is one component of a comprehensive MRSA colonization surveillance program. It is not intended to diagnose MRSA infection nor to guide or monitor treatment for MRSA infections.   Culture, respiratory (NON-Expectorated)  Status: None   Collection Time: 02/03/15  3:15 PM  Result Value Ref Range Status   Specimen Description ENDOTRACHEAL  Final   Special Requests NONE  Final   Gram Stain   Final    RARE WBC PRESENT, PREDOMINANTLY PMN RARE SQUAMOUS EPITHELIAL CELLS PRESENT NO ORGANISMS SEEN Performed at [facility]    Culture   Final    RARE CANDIDA ALBICANS Performed at [facility]    Report Status 02/06/2015 FINAL  Final  MRSA PCR Screening     Status: None   Collection Time: 02/03/15  4:45 PM  Result Value Ref Range Status   MRSA by PCR NEGATIVE NEGATIVE Final    Comment:        The GeneXpert MRSA Assay (FDA approved for NASAL specimens only), is one component of a comprehensive MRSA colonization surveillance program. It is not intended to diagnose MRSA infection nor to guide or monitor treatment for MRSA infections.      Studies:  Recent x-ray studies have been reviewed in detail by the Attending  Physician  Scheduled Meds:  Scheduled Meds: . amantadine  100 mg Oral TID  . antiseptic oral rinse  7 mL Mouth Rinse BID  . atorvastatin  10 mg Oral q1800  . feeding supplement  1 Container Oral Q24H  . feeding supplement (ENSURE ENLIVE)  237 mL Oral TID BM  . folic acid  1 mg Oral Daily  . heparin subcutaneous  5,000 Units Subcutaneous 3 times per day  . ipratropium-albuterol  3 mL Nebulization TID  . isosorbide mononitrate  10 mg Oral BID  . metoprolol tartrate  50 mg Oral BID  . multivitamin with minerals  1 tablet Oral Daily  . nicotine  21 mg Transdermal Daily  . pantoprazole  40 mg Oral Daily  . [START ON 02/08/2015] predniSONE  10 mg Oral Q breakfast  . risperiDONE  0.5 mg Oral QHS  . thiamine  100 mg Oral Daily    Time spent on care of this patient: 40 mins   Elgene Coral, Roselind Messier , MD  [facility] Office  [phone] Pager [phone]  On-Call/Text Page:      [email or web address]      password [email or web address]  If 7PM-7AM, please contact night-coverage [email or web address] Password [email or web address] 02/07/2015, 7:28 PM   LOS: 6 days   Care during the described time interval was provided by me .  I have reviewed this patient's available data, including medical history, events of note, physical examination, and all test results as part of my evaluation. I have personally reviewed and interpreted all radiology studies.   Carolyne Littles, MD [phone] Pager

## 2015-02-07 NOTE — Evaluation (Signed)
Physical Therapy Evaluation Patient Details Name: Jared Austin MRN: [id number] DOB: Jun 14, 1945 Today's Date: 02/07/2015   History of Present Illness  69 y/o M, smoker, with PMH of schizophrenia who was admitted to [facility] on 10/1 with SOB. Found to be hypoxic (86% on RA), concern for CAP. Decompensation with intubation 10/3 and was transferred to [facility] for further evaluation with self-extubation same day.    Clinical Impression  Pt pleasant but confused, unable to accurately state home setup and niece clarified that he lives on 3rd floor of home with brother who works. Pt until now has cared for himself with ADLs and gait, active smoker. Pt with sats 86% on RA at rest with 94% on 2L and required 3L with gait to maintain 90%. Pt with decreased balance, activity tolerance and safety awareness who will benefit from acute therapy to maximize mobility, function and safety. Niece reports they have started looking into a sitter service which would be highly beneficial along with a cell phone or life alert system available to pt in case of fall or emergency. Will follow.     Follow Up Recommendations Home health PT;Supervision for mobility/OOB    Equipment Recommendations  Rolling walker with 5" wheels;3in1 (PT)    Recommendations for Other Services       Precautions / Restrictions Precautions Precautions: Fall Precaution Comments: watch sats Restrictions Weight Bearing Restrictions: No      Mobility  Bed Mobility Overal bed mobility: Modified Independent                Transfers Overall transfer level: Needs assistance   Transfers: Sit to/from Stand Sit to Stand: Supervision         General transfer comment: cues for hand placement and safety  Ambulation/Gait Ambulation/Gait assistance: Min guard Ambulation Distance (Feet): 300 Feet Assistive device: Rolling walker (2 wheeled) Gait Pattern/deviations: Step-through pattern;Decreased stride length   Gait velocity  interpretation: at or above normal speed for age/gender General Gait Details: cues for position in RW, safety and posture with directional cues  Stairs            Wheelchair Mobility    Modified Rankin (Stroke Patients Only)       Balance Overall balance assessment: Needs assistance   Sitting balance-Leahy Scale: Good       Standing balance-Leahy Scale: Fair                               Pertinent Vitals/Pain Pain Assessment: No/denies pain  HR 78    Home Living Family/patient expects to be discharged to:: Private residence   Available Help at Discharge: Family;Available PRN/intermittently Type of Home: House Home Access: Stairs to enter   Entrance Stairs-Number of Steps: 2 Home Layout: Multi-level;Bed/bath upstairs Home Equipment: None Additional Comments: pt lives on 3rd floor of house with brother who works during the day. Bathroom is one floor below    Prior Function Level of Independence: Independent               Hand Dominance        Extremity/Trunk Assessment   Upper Extremity Assessment: Overall WFL for tasks assessed           Lower Extremity Assessment: Overall WFL for tasks assessed (hip flexion 4/5, knee flexion and extension 5/5)      Cervical / Trunk Assessment: Normal  Communication   Communication: No difficulties  Cognition Arousal/Alertness: Awake/alert Behavior During Therapy: WFL for  tasks assessed/performed Overall Cognitive Status: Impaired/Different from baseline Area of Impairment: Orientation;Problem solving;Safety/judgement Orientation Level: Disoriented to;Situation   Memory: Decreased short-term memory   Safety/Judgement: Decreased awareness of safety;Decreased awareness of deficits   Problem Solving: Slow processing      General Comments      Exercises General Exercises - Lower Extremity Hip Flexion/Marching: AROM;Seated;Both;15 reps      Assessment/Plan    PT Assessment Patient  needs continued PT services  PT Diagnosis Difficulty walking;Altered mental status   PT Problem List Decreased cognition;Decreased activity tolerance;Decreased balance;Decreased knowledge of use of DME;Decreased safety awareness;Decreased mobility;Cardiopulmonary status limiting activity  PT Treatment Interventions Gait training;Stair training;Functional mobility training;Therapeutic activities;Balance training;Patient/family education;Cognitive remediation;DME instruction   PT Goals (Current goals can be found in the Care Plan section) Acute Rehab PT Goals Patient Stated Goal: return home PT Goal Formulation: With patient Time For Goal Achievement: 02/21/15 Potential to Achieve Goals: Good    Frequency Min 3X/week   Barriers to discharge Decreased caregiver support      Co-evaluation               End of Session Equipment Utilized During Treatment: Oxygen Activity Tolerance: Patient tolerated treatment well Patient left: in chair;with call bell/phone within reach;with chair alarm set;with family/visitor present;with nursing/sitter in room Nurse Communication: Mobility status;Precautions         Time: 1610-9604 PT Time Calculation (min) (ACUTE ONLY): 29 min   Charges:   PT Evaluation $Initial PT Evaluation Tier I: 1 Procedure PT Treatments $Gait Training: 8-22 mins   PT G CodesDelorse Lek 02/07/2015, 11:12 AM Delaney Meigs, PT [phone]

## 2015-02-07 NOTE — Consult Note (Signed)
Referring Physician:  Jafeth Mustin is an 69 y.o. male.                       Chief Complaint: Abnormal troponin I  HPI: 69 year old male with shortness of breath with activity has no chest pain but minimally elevated troponin I along with history of smoking and hypertension. Echocardiogram showed normal systolic and moderate diastolic dysfunction and moderate pulmonary hypertension.  Past Medical History  Diagnosis Date  . Anxiety   . Schizophrenia ([place])       History reviewed. No pertinent past surgical history.  History reviewed. No pertinent family history. Social History:  reports that he has been smoking Cigarettes.  He has a 93 pack-year smoking history. He does not have any smokeless tobacco history on file. He reports that he does not drink alcohol or use illicit drugs.  Allergies:  Allergies  Allergen Reactions  . Penicillins Anxiety    Medications Prior to Admission  Medication Sig Dispense Refill  . amantadine (SYMMETREL) 100 MG capsule Take 100 mg by mouth 3 (three) times daily.    . Aspirin-Salicylamide-Caffeine (BC HEADACHE POWDER PO) Take 1 packet by mouth every 6 (six) hours as needed (Pain).    Marland Kitchen atorvastatin (LIPITOR) 10 MG tablet Take 10 mg by mouth daily.    Lorayne Bender SUSTENNA 156 MG/ML SUSP injection Inject 156 mg as directed every 6 (six) weeks.       Results for orders placed or performed during the hospital encounter of 02/01/15 (from the past 48 hour(s))  Procalcitonin     Status: None   Collection Time: 02/06/15  5:21 AM  Result Value Ref Range   Procalcitonin 0.30 ng/mL    Comment:        Interpretation: PCT (Procalcitonin) <= 0.5 ng/mL: Systemic infection (sepsis) is not likely. Local bacterial infection is possible. (NOTE)         ICU PCT Algorithm               Non ICU PCT Algorithm    ----------------------------     ------------------------------         PCT < 0.25 ng/mL                 PCT < 0.1 ng/mL     Stopping of antibiotics             Stopping of antibiotics       strongly encouraged.               strongly encouraged.    ----------------------------     ------------------------------       PCT level decrease by               PCT < 0.25 ng/mL       >= 80% from peak PCT       OR PCT 0.25 - 0.5 ng/mL          Stopping of antibiotics                                             encouraged.     Stopping of antibiotics           encouraged.    ----------------------------     ------------------------------       PCT level decrease by  PCT >= 0.25 ng/mL       < 80% from peak PCT        AND PCT >= 0.5 ng/mL            Continuin g antibiotics                                              encouraged.       Continuing antibiotics            encouraged.    ----------------------------     ------------------------------     PCT level increase compared          PCT > 0.5 ng/mL         with peak PCT AND          PCT >= 0.5 ng/mL             Escalation of antibiotics                                          strongly encouraged.      Escalation of antibiotics        strongly encouraged.   Basic metabolic panel     Status: Abnormal   Collection Time: 02/06/15  5:21 AM  Result Value Ref Range   Sodium 142 135 - 145 mmol/L   Potassium 4.3 3.5 - 5.1 mmol/L   Chloride 101 101 - 111 mmol/L   CO2 30 22 - 32 mmol/L   Glucose, Bld 104 (H) 65 - 99 mg/dL   BUN 65 (H) 6 - 20 mg/dL   Creatinine, Ser 2.27 (H) 0.61 - 1.24 mg/dL   Calcium 9.4 8.9 - 10.3 mg/dL   GFR calc non Af Amer 28 (L) >60 mL/min   GFR calc Af Amer 32 (L) >60 mL/min    Comment: (NOTE) The eGFR has been calculated using the CKD EPI equation. This calculation has not been validated in all clinical situations. eGFR's persistently <60 mL/min signify possible Chronic Kidney Disease.    Anion gap 11 5 - 15  CBC     Status: Abnormal   Collection Time: 02/06/15  5:21 AM  Result Value Ref Range   WBC 12.4 (H) 4.0 - 10.5 K/uL   RBC 3.55 (L) 4.22 - 5.81  MIL/uL   Hemoglobin 10.7 (L) 13.0 - 17.0 g/dL   HCT 32.0 (L) 39.0 - 52.0 %   MCV 90.1 78.0 - 100.0 fL   MCH 30.1 26.0 - 34.0 pg   MCHC 33.4 30.0 - 36.0 g/dL   RDW 13.9 11.5 - 15.5 %   Platelets 172 150 - 400 K/uL  Urine rapid drug screen (hosp performed)     Status: None   Collection Time: 02/06/15  6:44 PM  Result Value Ref Range   Opiates NONE DETECTED NONE DETECTED   Cocaine NONE DETECTED NONE DETECTED   Benzodiazepines NONE DETECTED NONE DETECTED   Amphetamines NONE DETECTED NONE DETECTED   Tetrahydrocannabinol NONE DETECTED NONE DETECTED   Barbiturates NONE DETECTED NONE DETECTED    Comment:        DRUG SCREEN FOR MEDICAL PURPOSES ONLY.  IF CONFIRMATION IS NEEDED FOR ANY PURPOSE, NOTIFY LAB WITHIN 5 DAYS.        LOWEST DETECTABLE LIMITS  FOR URINE DRUG SCREEN Drug Class       Cutoff (ng/mL) Amphetamine      1000 Barbiturate      200 Benzodiazepine   834 Tricyclics       196 Opiates          300 Cocaine          300 THC              50   Troponin I (q 6hr x 3)     Status: Abnormal   Collection Time: 02/06/15  7:30 PM  Result Value Ref Range   Troponin I 0.31 (H) <0.031 ng/mL    Comment:        PERSISTENTLY INCREASED TROPONIN VALUES IN THE RANGE OF 0.04-0.49 ng/mL CAN BE SEEN IN:       -UNSTABLE ANGINA       -CONGESTIVE HEART FAILURE       -MYOCARDITIS       -CHEST TRAUMA       -ARRYHTHMIAS       -LATE PRESENTING MYOCARDIAL INFARCTION       -COPD   CLINICAL FOLLOW-UP RECOMMENDED.   Vitamin B12     Status: None   Collection Time: 02/06/15  7:30 PM  Result Value Ref Range   Vitamin B-12 777 180 - 914 pg/mL    Comment: (NOTE) This assay is not validated for testing neonatal or myeloproliferative syndrome specimens for Vitamin B12 levels.   Folate     Status: None   Collection Time: 02/06/15  7:30 PM  Result Value Ref Range   Folate 18.1 >5.9 ng/mL  Iron and TIBC     Status: None   Collection Time: 02/06/15  7:30 PM  Result Value Ref Range   Iron 66 45  - 182 ug/dL   TIBC 251 250 - 450 ug/dL   Saturation Ratios 26 17.9 - 39.5 %   UIBC 185 ug/dL  Ferritin     Status: Abnormal   Collection Time: 02/06/15  7:30 PM  Result Value Ref Range   Ferritin 352 (H) 24 - 336 ng/mL  Reticulocytes     Status: Abnormal   Collection Time: 02/06/15  7:30 PM  Result Value Ref Range   Retic Ct Pct 2.9 0.4 - 3.1 %   RBC. 3.69 (L) 4.22 - 5.81 MIL/uL   Retic Count, Manual 107.0 19.0 - 186.0 K/uL  Lactate dehydrogenase     Status: Abnormal   Collection Time: 02/06/15  7:30 PM  Result Value Ref Range   LDH 383 (H) 98 - 192 U/L  Haptoglobin     Status: Abnormal   Collection Time: 02/06/15  7:30 PM  Result Value Ref Range   Haptoglobin 228 (H) 34 - 200 mg/dL    Comment: (NOTE) Performed At: [facility] [place], [place] [id number] Lindon Romp MD [id number]   Troponin I (q 6hr x 3)     Status: Abnormal   Collection Time: 02/07/15  1:18 AM  Result Value Ref Range   Troponin I 0.27 (H) <0.031 ng/mL    Comment:        PERSISTENTLY INCREASED TROPONIN VALUES IN THE RANGE OF 0.04-0.49 ng/mL CAN BE SEEN IN:       -UNSTABLE ANGINA       -CONGESTIVE HEART FAILURE       -MYOCARDITIS       -CHEST TRAUMA       -ARRYHTHMIAS       -  LATE PRESENTING MYOCARDIAL INFARCTION       -COPD   CLINICAL FOLLOW-UP RECOMMENDED.   Comprehensive metabolic panel     Status: Abnormal   Collection Time: 02/07/15  7:57 AM  Result Value Ref Range   Sodium 139 135 - 145 mmol/L   Potassium 4.2 3.5 - 5.1 mmol/L   Chloride 98 (L) 101 - 111 mmol/L   CO2 30 22 - 32 mmol/L   Glucose, Bld 84 65 - 99 mg/dL   BUN 60 (H) 6 - 20 mg/dL   Creatinine, Ser 2.10 (H) 0.61 - 1.24 mg/dL   Calcium 8.8 (L) 8.9 - 10.3 mg/dL   Total Protein 5.6 (L) 6.5 - 8.1 g/dL   Albumin 2.6 (L) 3.5 - 5.0 g/dL   AST 18 15 - 41 U/L   ALT 13 (L) 17 - 63 U/L   Alkaline Phosphatase 56 38 - 126 U/L   Total Bilirubin 1.0 0.3 - 1.2 mg/dL   GFR calc non Af Amer 31 (L) >60 mL/min    GFR calc Af Amer 36 (L) >60 mL/min    Comment: (NOTE) The eGFR has been calculated using the CKD EPI equation. This calculation has not been validated in all clinical situations. eGFR's persistently <60 mL/min signify possible Chronic Kidney Disease.    Anion gap 11 5 - 15  CBC with Differential/Platelet     Status: Abnormal   Collection Time: 02/07/15  7:57 AM  Result Value Ref Range   WBC 9.3 4.0 - 10.5 K/uL   RBC 3.51 (L) 4.22 - 5.81 MIL/uL   Hemoglobin 10.6 (L) 13.0 - 17.0 g/dL   HCT 32.0 (L) 39.0 - 52.0 %   MCV 91.2 78.0 - 100.0 fL   MCH 30.2 26.0 - 34.0 pg   MCHC 33.1 30.0 - 36.0 g/dL   RDW 14.4 11.5 - 15.5 %   Platelets 158 150 - 400 K/uL   Neutrophils Relative % 83 %   Neutro Abs 7.7 1.7 - 7.7 K/uL   Lymphocytes Relative 9 %   Lymphs Abs 0.8 0.7 - 4.0 K/uL   Monocytes Relative 7 %   Monocytes Absolute 0.6 0.1 - 1.0 K/uL   Eosinophils Relative 2 %   Eosinophils Absolute 0.1 0.0 - 0.7 K/uL   Basophils Relative 0 %   Basophils Absolute 0.0 0.0 - 0.1 K/uL  Magnesium     Status: Abnormal   Collection Time: 02/07/15  7:57 AM  Result Value Ref Range   Magnesium 2.5 (H) 1.7 - 2.4 mg/dL  Troponin I (q 6hr x 3)     Status: Abnormal   Collection Time: 02/07/15  7:57 AM  Result Value Ref Range   Troponin I 0.28 (H) <0.031 ng/mL    Comment:        PERSISTENTLY INCREASED TROPONIN VALUES IN THE RANGE OF 0.04-0.49 ng/mL CAN BE SEEN IN:       -UNSTABLE ANGINA       -CONGESTIVE HEART FAILURE       -MYOCARDITIS       -CHEST TRAUMA       -ARRYHTHMIAS       -LATE PRESENTING MYOCARDIAL INFARCTION       -COPD   CLINICAL FOLLOW-UP RECOMMENDED.    No results found.  Review Of Systems Pt denies any fevers, chills, nausea, vomiting, diarrhea, constipation, abdominal pain, orthopnea, cough, wheezing, palpitations, headache, vision changes, lightheadedness, dizziness, diarrhea, constipation, melena, rectal bleeding. Review of systems are otherwise negative  Blood pressure 143/72,  pulse  65, temperature 98.1 F (36.7 C), temperature source Oral, resp. rate 18, height _0  (1.727 m), weight 44.997 kg (99 lb 3.2 oz), SpO2 99 %. General: older black male. Awake and alert and oriented x3. No acute cardiopulmonary distress.  Eyes: Brown eyes, Pupils equal, round, reactive to light. Extraocular muscles are intact. Sclerae anicteric and noninjected.  ENT: Moist mucosal membranes. No mucosal lesions. Neck: Neck supple without lymphadenopathy. No carotid bruits. No masses palpated.  Cardiovascular: Regular rate with normal S1-S2 sounds. II/VI systolic murmur, no rubs, gallops auscultated. No JVD.  Respiratory: prolonged expiration phase. Mild wheezes noted earlier are clearing  Abdomen: Soft, nontender, nondistended. Active bowel sounds. No masses or hepatosplenomegaly  Skin: Dry, warm to touch. 2+ dorsalis pedis and radial pulses. Musculoskeletal: No calf or leg pain. All major joints not erythematous, nontender.  Psychiatric: Intact judgment and insight.  Neurologic: No focal neurological deficits. Cranial nerves II through XII are grossly intact.  Assessment/Plan Shortness of breath Acute diastolic heart failure COPD Moderate pulmonary hypertension Abnormal cardiac enzymes Protein calorie malnutrition Acute kidney injury Anxiety Schizophrenia Tobacco use disorder  Agree with chronic oxygen use. Nuclear stress test in AM. Dr. Charolette Forward covering for weekend.   Birdie Riddle, MD  02/07/2015, 1:26 PM

## 2015-02-07 NOTE — Care Management Note (Signed)
Case Management Note  Patient Details  Name: Jared Austin MRN: [id number] Date of Birth: [birth year]  Subjective/Objective:      Admitted with CAP              Action/Plan: Information given to patient about Life Alert ( medical system to alert the first responders for home emergency). HHC choice offered, patient chose [facility] for HHC needs, Otelia Santee with [employer] called for arrangements. DME ordered - patient qualifies for home 02 - ordered, also rolling walker and 3:1 to be delivered to room today prior to discharging home when medically cleared.   Expected Discharge Date:    possibly 02/07/2015              Expected Discharge Plan:  Home w Home Health Services  Discharge planning Services  CM Consult  Choice offered to:  Patient  DME Arranged:  Walker rolling, 3-N-1, Oxygen DME Agency:  [facility]  HH Arranged:  RN, Disease Management, PT, OT, Nurse's Aide HH Agency:  [facility]  Status of Service:  In process, will continue to follow  Medicare Important Message Given:  [facility] notification given  Reola Mosher [phone] 02/07/2015, 11:42 AM

## 2015-02-07 NOTE — Progress Notes (Signed)
SATURATION QUALIFICATIONS: (This note is used to comply with regulatory documentation for home oxygen)  Patient Saturations on Room Air at Rest = 86%  Patient saturation on 2L at rest = 94%  Patient Saturations on 2L while Ambulating = 87%  Patient Saturations on 3 Liters of oxygen while Ambulating = 90%  Please briefly explain why patient needs home oxygen: Pt with desaturation on RA at rest and on less than 3L with mobility  [employer], PT [phone]

## 2015-02-07 NOTE — Progress Notes (Signed)
CM talked to patient with family members present - private sitter list given; patient lives on the 3rd floor of his brother's home- CM encouraged family to possibly move him to the first floor until he is able to climb the stairs. DME ordered- oxygen, rolling walker and 3:1; Alexis Goodell [phone]

## 2015-02-07 NOTE — Progress Notes (Signed)
Nutrition Follow-up  DOCUMENTATION CODES:   Severe malnutrition in context of chronic illness, Underweight  INTERVENTION:  Continue Ensure Enlive po TID, each supplement provides 350 kcal and 20 grams of protein Provide Boost Breeze once daily, provides 250 kcal and 9 grams of protein Provide Multivitamin with minerals daily  NUTRITION DIAGNOSIS:   Malnutrition related to acute illness as evidenced by percent weight loss, severe depletion of muscle mass (12% weight loss within one month).  Ongoing  GOAL:   Patient will meet greater than or equal to 90% of their needs  Unmet  MONITOR:   Labs, Weight trends, Diet advancement, PO intake  REASON FOR ASSESSMENT:   Consult Enteral/tube feeding initiation and management  ASSESSMENT:   69 y/o M, smoker, with PMH of schizophrenia who was admitted to [facility] on 10/1 with SOB. Found to be hypoxic (86% on RA), concern for CAP, elevated troponin and AKI. Admitted per [facility]. He decompensated requiring intubation 10/3 and was transferred to [facility] for further evaluation.   Pt asleep upon second attempt to visit today. Per nursing notes, pt ate 25% of breakfast this morning and 100% of a [place] sandwich for lunch. He is drinking Ensure Enlive. Weight continues to trend down.   Labs: low hemoglobin, low chloride, elevated BUN/creatinine  Diet Order:  Diet Heart Room service appropriate?: Yes; Fluid consistency:: Thin Diet NPO time specified  Skin:  Reviewed, no issues  Last BM:  10/6  Height:   Ht Readings from Last 1 Encounters:  02/05/15  (1.727 m)    Weight:   Wt Readings from Last 1 Encounters:  02/07/15 99 lb 3.2 oz (44.997 kg)    Ideal Body Weight:  67.3 kg  BMI:  Body mass index is 15.09 kg/(m^2).  Estimated Nutritional Needs:   Kcal:  1650-1850  Protein:  75-90 gm  Fluid:  1.6-1.8 L  EDUCATION NEEDS:   No education needs identified at this time  Dorothea Ogle RD, LDN Inpatient Clinical  Dietitian Pager: [phone] After Hours Pager: [phone]

## 2015-02-08 ENCOUNTER — Inpatient Hospital Stay (HOSPITAL_COMMUNITY): Payer: Medicare Other

## 2015-02-08 DIAGNOSIS — I249 Acute ischemic heart disease, unspecified: Secondary | ICD-10-CM

## 2015-02-08 LAB — COMPREHENSIVE METABOLIC PANEL
ALBUMIN: 2.4 g/dL — AB (ref 3.5–5.0)
ALK PHOS: 49 U/L (ref 38–126)
ALT: 11 U/L — ABNORMAL LOW (ref 17–63)
ANION GAP: 9 (ref 5–15)
AST: 17 U/L (ref 15–41)
BUN: 59 mg/dL — ABNORMAL HIGH (ref 6–20)
CO2: 29 mmol/L (ref 22–32)
Calcium: 8.9 mg/dL (ref 8.9–10.3)
Chloride: 101 mmol/L (ref 101–111)
Creatinine, Ser: 2.09 mg/dL — ABNORMAL HIGH (ref 0.61–1.24)
GFR calc Af Amer: 36 mL/min — ABNORMAL LOW (ref >60)
GFR calc non Af Amer: 31 mL/min — ABNORMAL LOW (ref >60)
GLUCOSE: 107 mg/dL — AB (ref 65–99)
POTASSIUM: 3.9 mmol/L (ref 3.5–5.1)
SODIUM: 139 mmol/L (ref 135–145)
Total Bilirubin: 0.9 mg/dL (ref 0.3–1.2)
Total Protein: 5.2 g/dL — ABNORMAL LOW (ref 6.5–8.1)

## 2015-02-08 LAB — CBC WITH DIFFERENTIAL/PLATELET
BASOS ABS: 0 10*3/uL (ref 0.0–0.1)
BASOS PCT: 0 %
EOS ABS: 0.2 10*3/uL (ref 0.0–0.7)
EOS PCT: 2 %
HCT: 30.5 % — ABNORMAL LOW (ref 39.0–52.0)
HEMOGLOBIN: 10 g/dL — AB (ref 13.0–17.0)
LYMPHS ABS: 1.1 10*3/uL (ref 0.7–4.0)
Lymphocytes Relative: 11 %
MCH: 30.3 pg (ref 26.0–34.0)
MCHC: 32.8 g/dL (ref 30.0–36.0)
MCV: 92.4 fL (ref 78.0–100.0)
Monocytes Absolute: 0.5 10*3/uL (ref 0.1–1.0)
Monocytes Relative: 5 %
NEUTROS PCT: 82 %
Neutro Abs: 8.2 10*3/uL — ABNORMAL HIGH (ref 1.7–7.7)
PLATELETS: 167 10*3/uL (ref 150–400)
RBC: 3.3 MIL/uL — AB (ref 4.22–5.81)
RDW: 14.7 % (ref 11.5–15.5)
WBC: 9.9 10*3/uL (ref 4.0–10.5)

## 2015-02-08 LAB — MAGNESIUM: MAGNESIUM: 2.4 mg/dL (ref 1.7–2.4)

## 2015-02-08 MED ORDER — REGADENOSON 0.4 MG/5ML IV SOLN
INTRAVENOUS | Status: AC
  Administered 2015-02-08: 0.4 mg via INTRAVENOUS
  Filled 2015-02-08: qty 5

## 2015-02-08 MED ORDER — REGADENOSON 0.4 MG/5ML IV SOLN
0.4000 mg | Freq: Once | INTRAVENOUS | Status: AC
  Administered 2015-02-08: 0.4 mg via INTRAVENOUS
  Filled 2015-02-08: qty 5

## 2015-02-08 MED ORDER — TECHNETIUM TC 99M SESTAMIBI - CARDIOLITE
30.0000 | Freq: Once | INTRAVENOUS | Status: AC | PRN
  Administered 2015-02-08: 11:00:00 30 via INTRAVENOUS

## 2015-02-08 MED ORDER — IPRATROPIUM-ALBUTEROL 0.5-2.5 (3) MG/3ML IN SOLN
3.0000 mL | Freq: Four times a day (QID) | RESPIRATORY_TRACT | Status: DC
  Administered 2015-02-09: 3 mL via RESPIRATORY_TRACT
  Filled 2015-02-08 (×2): qty 3

## 2015-02-08 MED ORDER — METHYLPREDNISOLONE SODIUM SUCC 40 MG IJ SOLR
40.0000 mg | Freq: Four times a day (QID) | INTRAMUSCULAR | Status: DC
  Administered 2015-02-08 – 2015-02-09 (×3): 40 mg via INTRAVENOUS
  Filled 2015-02-08 (×4): qty 1

## 2015-02-08 MED ORDER — TECHNETIUM TC 99M SESTAMIBI GENERIC - CARDIOLITE
10.0000 | Freq: Once | INTRAVENOUS | Status: AC | PRN
  Administered 2015-02-08: 10 via INTRAVENOUS

## 2015-02-08 NOTE — Progress Notes (Signed)
Subjective:  Seen in nuclear medicine department denies any chest pain or shortness of breath. Tolerated stress portion of Lexiscan Myoview.  Objective:  Vital Signs in the last 24 hours: Temp:  [98.1 F (36.7 C)-98.5 F (36.9 C)] 98.1 F (36.7 C) (10/08 1228) Pulse Rate:  [60-85] 67 (10/08 1228) Resp:  [14-20] 20 (10/08 1228) BP: (138-174)/(58-85) 170/74 mmHg (10/08 1228) SpO2:  [91 %-98 %] 98 % (10/08 1228) Weight:  [47.9 kg (105 lb 9.6 oz)-48.127 kg (106 lb 1.6 oz)] 47.9 kg (105 lb 9.6 oz) (10/08 0740)  Intake/Output from previous day: 10/07 0701 - 10/08 0700 In: 720 [P.O.:720] Out: 1500 [Urine:1500] Intake/Output from this shift:    Physical Exam: Neck: no adenopathy, no carotid bruit, no JVD and supple, symmetrical, trachea midline Lungs: Occasional bilateral rhonchi noted Heart: regular rate and rhythm, S1, S2 normal and 2/6 systolic murmur noted Abdomen: soft, non-tender; bowel sounds normal; no masses,  no organomegaly Extremities: extremities normal, atraumatic, no cyanosis or edema  Lab Results:  Recent Labs  02/07/15 0757 02/08/15 0413  WBC 9.3 9.9  HGB 10.6* 10.0*  PLT 158 167    Recent Labs  02/07/15 0757 02/08/15 0413  NA 139 139  K 4.2 3.9  CL 98* 101  CO2 30 29  GLUCOSE 84 107*  BUN 60* 59*  CREATININE 2.10* 2.09*    Recent Labs  02/07/15 0118 02/07/15 0757  TROPONINI 0.27* 0.28*   Hepatic Function Panel  Recent Labs  02/08/15 0413  PROT 5.2*  ALBUMIN 2.4*  AST 17  ALT 11*  ALKPHOS 49  BILITOT 0.9   No results for input(s): CHOL in the last 72 hours. No results for input(s): PROTIME in the last 72 hours.  Imaging: Imaging results have been reviewed and No results found.  Cardiac Studies:  Assessment/Plan:  Status post probable very small non-Q-wave MI secondary to demand ischemia rule out CAD Acute diastolic heart failure COPD Moderate pulmonary hypertension Abnormal cardiac enzymes Protein calorie  malnutrition Acute kidney injury Anxiety Schizophrenia Tobacco use disorder Plan Check nuclear stress test results Continue present management  LOS: 7 days    Jared Austin 02/08/2015, 1:25 PM

## 2015-02-08 NOTE — Progress Notes (Signed)
[place] TEAM 1 - Stepdown/ICU TEAM Progress Note  Jared Austin ZOX:[id number] DOB: [birth year] DOA: 02/01/2015 PCP: Avon Gully, MD  Admit HPI / Brief Narrative: 69 y/o BM PMHx COPD (not on home O2), Current Smoker, Schizophrenia, Anxiety, Alcohol Abuse, HLD.   Admitted to [facility] on 10/1 with SOB. Found to be hypoxic (86% on RA), concern for CAP, elevated troponin and AKI. Admitted per TRH. He decompensated requiring intubation 10/3 and was transferred to [facility] for further evaluation   HPI/Subjective: 10/8 A/O 4, negative CP, negative N/V. smokes 2-3 PPD of cigarettes. Feels tightness in the chest, cough    Assessment/Plan: Acute Respiratory Failure with hypercarbia/Pulmonary Edema -Resolved  -Obtain ambulatory SPO2; DME setup for home O2, patient will need 2 L O2 at rest, and 3 L O2 during exertion  COPD Exacerbation  -Counseled patient extensively on the sequela of continuing to smoke. -Patient has agreed to stop smoking will start nicotine patch -Keep the patient on duonebs -solumedrol  Tobacco Abuse  -See COPD exacerbation -Duonebs TID   Chronic diastolic CHF -metoprolol 50 mg BID -Isosorbide mononitrate 10 mg BID  Pulmonary hypertension -See chronic diastolic CHF  Elevated troponin, continue elevation -Troponins continue to be elevated given patient has a HEART score= 4 have consult cardiology for stress test prior to discharge -Underwent stress test today. Report is pending -If stress test positive will proceed to cardiac catheterization  Hypertension  -See chronic diastolic CHF  AKI - unclear baseline secondary to no information. -Slowly improving -Avoid nephrotoxic medication; unfortunately may require cardiac catheterization use contrast protocol for renal failure  Hypokalemia -Potassium goal> 4 -WNL  Protein Calorie Malnutrition  -Encourage patient to eat -Ensure TID  Anemia -Anemia panel pending  Hx DVT -Subcutaneous heparin for  prophylaxis  Schizophrenia and anxiety -Amantadine 100 mg TID -Risperdal 0.5 mg QHS   ? ETOH Abuse  -ContinueThiamine / Folate / MVI   Code Status: FULL Family Communication: Niece and brother present at time of exam Disposition Plan: Home 24-48 hours    Consultants: Dr. Nelda Bucks [facility] M)  Dr. Orpah Cobb (cardiology)    Procedure/Significant Events: 10/03 Echo >> 10/1 Admit with SOB 10/3 Transferred to [facility] as patient is altered on mechanical ventilation.  10/3 late self extubation 10/4 echocardiogram;- LVEF= 50% to 55%. -(grade 2 diastolic dysfunction). - Pulmonary arteries: Systolic pressure PA peak pressure: 66 mm Hg (S).  Culture Sputum Cx 10/3 >>   Antibiotics: Rocephin / Azithro, start date 10/1 >> 10/3  Vanco, start date 10/3>>>10/4 Zosyn, start date 10/3>>>10/4    DVT prophylaxis: Subcutaneous heparin   Devices    LINES / TUBES:      Continuous Infusions: . sodium chloride 10 mL/hr at 02/05/15 2140    Objective: VITAL SIGNS: Temp: 98.1 F (36.7 C) (10/08 1228) Temp Source: Oral (10/08 1228) BP: 170/74 mmHg (10/08 1228) Pulse Rate: 67 (10/08 1228) SPO2; FIO2:   Intake/Output Summary (Last 24 hours) at 02/08/15 1708 Last data filed at 02/08/15 1342  Gross per 24 hour  Intake    720 ml  Output   1500 ml  Net   -780 ml     Exam: General: A/O 4, NAD, No acute respiratory distress Eyes: Negative headache, eye pain, double vision,negative scleral hemorrhage ENT: Negative Runny nose, negative ear pain, negative gingival bleeding, Neck:  Negative scars, masses, torticollis, lymphadenopathy, JVD Lungs: Clear to auscultation bilaterally without wheezes or crackles Cardiovascular: Regular rate and rhythm without murmur gallop or rub normal S1 and S2 Abdomen:negative  abdominal pain, nondistended, positive soft, bowel sounds, no rebound, no ascites, no appreciable mass Extremities: No significant cyanosis, clubbing,  or edema bilateral lower extremities Psychiatric:  Negative depression, negative anxiety, negative fatigue, negative mania Neurologic:  Cranial nerves II through XII intact, tongue/uvula midline, all extremities muscle strength 5/5, sensation intact throughout, negative dysarthria, negative expressive aphasia, negative receptive aphasia.   Data Reviewed: Basic Metabolic Panel:  Recent Labs Lab 02/03/15 1557 02/04/15 0318 02/05/15 0300 02/06/15 0521 02/07/15 0757 02/08/15 0413  NA  --  137 139 142 139 139  K  --  3.8 3.7 4.3 4.2 3.9  CL  --  103 100* 101 98* 101  CO2  --  25 27 30 30 29   GLUCOSE  --  166* 128* 104* 84 107*  BUN  --  52* 57* 65* 60* 59*  CREATININE  --  2.28* 2.32* 2.27* 2.10* 2.09*  CALCIUM  --  9.4 9.4 9.4 8.8* 8.9  MG 2.4 2.3 2.4  --  2.5* 2.4  PHOS 5.5* 4.9* 3.4  --   --   --    Liver Function Tests:  Recent Labs Lab 02/03/15 0835 02/07/15 0757 02/08/15 0413  AST 34 18 17  ALT 18 13* 11*  ALKPHOS 75 56 49  BILITOT 0.7 1.0 0.9  PROT 6.9 5.6* 5.2*  ALBUMIN 3.0* 2.6* 2.4*   No results for input(s): LIPASE, AMYLASE in the last 168 hours.  Recent Labs Lab 02/03/15 0835  AMMONIA 29   CBC:  Recent Labs Lab 02/03/15 0835 02/04/15 0318 02/06/15 0521 02/07/15 0757 02/08/15 0413  WBC 16.7* 11.6* 12.4* 9.3 9.9  NEUTROABS 15.8*  --   --  7.7 8.2*  HGB 11.6* 10.7* 10.7* 10.6* 10.0*  HCT 34.0* 31.7* 32.0* 32.0* 30.5*  MCV 90.7 90.3 90.1 91.2 92.4  PLT 228 174 172 158 167   Cardiac Enzymes:  Recent Labs Lab 02/01/15 2118 02/03/15 0835 02/06/15 1930 02/07/15 0118 02/07/15 0757  TROPONINI 0.06* 0.20* 0.31* 0.27* 0.28*   BNP (last 3 results)  Recent Labs  02/01/15 1530 02/03/15 0835  BNP 222.0* 602.0*    ProBNP (last 3 results) No results for input(s): PROBNP in the last 8760 hours.  CBG:  Recent Labs Lab 02/03/15 2351 02/04/15 0338 02/04/15 1307 02/05/15 0009 02/05/15 0359  GLUCAP 182* 176* 163* 146* 138*    Recent  Results (from the past 240 hour(s))  Culture, respiratory (NON-Expectorated)     Status: None   Collection Time: 02/03/15 12:39 PM  Result Value Ref Range Status   Specimen Description TRACHEAL ASPIRATE  Final   Special Requests NONE  Final   Gram Stain   Final    RARE WBC PRESENT,BOTH PMN AND MONONUCLEAR NO SQUAMOUS EPITHELIAL CELLS SEEN NO ORGANISMS SEEN Performed at [facility]    Culture   Final    RARE YEAST CONSISTENT WITH CANDIDA SPECIES Performed at [facility]    Report Status 02/06/2015 FINAL  Final  MRSA PCR Screening     Status: None   Collection Time: 02/03/15  3:00 PM  Result Value Ref Range Status   MRSA by PCR NEGATIVE NEGATIVE Final    Comment:        The GeneXpert MRSA Assay (FDA approved for NASAL specimens only), is one component of a comprehensive MRSA colonization surveillance program. It is not intended to diagnose MRSA infection nor to guide or monitor treatment for MRSA infections.   Culture, respiratory (NON-Expectorated)     Status: None  Collection Time: 02/03/15  3:15 PM  Result Value Ref Range Status   Specimen Description ENDOTRACHEAL  Final   Special Requests NONE  Final   Gram Stain   Final    RARE WBC PRESENT, PREDOMINANTLY PMN RARE SQUAMOUS EPITHELIAL CELLS PRESENT NO ORGANISMS SEEN Performed at [facility]    Culture   Final    RARE CANDIDA ALBICANS Performed at [facility]    Report Status 02/06/2015 FINAL  Final  MRSA PCR Screening     Status: None   Collection Time: 02/03/15  4:45 PM  Result Value Ref Range Status   MRSA by PCR NEGATIVE NEGATIVE Final    Comment:        The GeneXpert MRSA Assay (FDA approved for NASAL specimens only), is one component of a comprehensive MRSA colonization surveillance program. It is not intended to diagnose MRSA infection nor to guide or monitor treatment for MRSA infections.      Studies:  Recent x-ray studies have been reviewed in detail by  the Attending Physician  Scheduled Meds:  Scheduled Meds: . amantadine  100 mg Oral TID  . antiseptic oral rinse  7 mL Mouth Rinse BID  . atorvastatin  10 mg Oral q1800  . feeding supplement  1 Container Oral Q24H  . feeding supplement (ENSURE ENLIVE)  237 mL Oral TID BM  . folic acid  1 mg Oral Daily  . heparin subcutaneous  5,000 Units Subcutaneous 3 times per day  . ipratropium-albuterol  3 mL Nebulization TID  . isosorbide mononitrate  10 mg Oral BID  . metoprolol tartrate  50 mg Oral BID  . multivitamin with minerals  1 tablet Oral Daily  . nicotine  21 mg Transdermal Daily  . pantoprazole  40 mg Oral Daily  . predniSONE  10 mg Oral Q breakfast  . risperiDONE  0.5 mg Oral QHS  . thiamine  100 mg Oral Daily    Time spent on care of this patient: 40 mins   Darlys Buis , MD  [facility] Office  [phone] Pager - [phone]  On-Call/Text Page:      [email or web address]      password [email or web address]  If 7PM-7AM, please contact night-coverage [email or web address] Password [email or web address] 02/08/2015, 5:08 PM   LOS: 7 days   Care during the described time interval was provided by me .  I have reviewed this patient's available data, including medical history, events of note, physical examination, and all test results as part of my evaluation. I have personally reviewed and interpreted all radiology studies.

## 2015-02-09 ENCOUNTER — Inpatient Hospital Stay (HOSPITAL_COMMUNITY): Payer: Medicare Other

## 2015-02-09 DIAGNOSIS — Z720 Tobacco use: Secondary | ICD-10-CM

## 2015-02-09 DIAGNOSIS — F209 Schizophrenia, unspecified: Secondary | ICD-10-CM

## 2015-02-09 DIAGNOSIS — J441 Chronic obstructive pulmonary disease with (acute) exacerbation: Secondary | ICD-10-CM

## 2015-02-09 DIAGNOSIS — J9602 Acute respiratory failure with hypercapnia: Secondary | ICD-10-CM

## 2015-02-09 DIAGNOSIS — N179 Acute kidney failure, unspecified: Secondary | ICD-10-CM

## 2015-02-09 DIAGNOSIS — I272 Other secondary pulmonary hypertension: Secondary | ICD-10-CM

## 2015-02-09 DIAGNOSIS — I10 Essential (primary) hypertension: Secondary | ICD-10-CM

## 2015-02-09 LAB — COMPREHENSIVE METABOLIC PANEL
ALT: 15 U/L — AB (ref 17–63)
AST: 18 U/L (ref 15–41)
Albumin: 2.5 g/dL — ABNORMAL LOW (ref 3.5–5.0)
Alkaline Phosphatase: 51 U/L (ref 38–126)
Anion gap: 12 (ref 5–15)
BUN: 52 mg/dL — AB (ref 6–20)
CHLORIDE: 100 mmol/L — AB (ref 101–111)
CO2: 28 mmol/L (ref 22–32)
CREATININE: 1.83 mg/dL — AB (ref 0.61–1.24)
Calcium: 9 mg/dL (ref 8.9–10.3)
GFR calc Af Amer: 42 mL/min — ABNORMAL LOW (ref >60)
GFR calc non Af Amer: 36 mL/min — ABNORMAL LOW (ref >60)
GLUCOSE: 131 mg/dL — AB (ref 65–99)
Potassium: 4.3 mmol/L (ref 3.5–5.1)
SODIUM: 140 mmol/L (ref 135–145)
Total Bilirubin: 0.8 mg/dL (ref 0.3–1.2)
Total Protein: 5.4 g/dL — ABNORMAL LOW (ref 6.5–8.1)

## 2015-02-09 LAB — CBC WITH DIFFERENTIAL/PLATELET
Basophils Absolute: 0 10*3/uL (ref 0.0–0.1)
Basophils Relative: 0 %
EOS ABS: 0 10*3/uL (ref 0.0–0.7)
EOS PCT: 0 %
HCT: 31.7 % — ABNORMAL LOW (ref 39.0–52.0)
HEMOGLOBIN: 10.4 g/dL — AB (ref 13.0–17.0)
LYMPHS ABS: 0.4 10*3/uL — AB (ref 0.7–4.0)
Lymphocytes Relative: 6 %
MCH: 30.1 pg (ref 26.0–34.0)
MCHC: 32.8 g/dL (ref 30.0–36.0)
MCV: 91.6 fL (ref 78.0–100.0)
MONOS PCT: 1 %
Monocytes Absolute: 0.1 10*3/uL (ref 0.1–1.0)
NEUTROS PCT: 93 %
Neutro Abs: 7.1 10*3/uL (ref 1.7–7.7)
Platelets: 191 10*3/uL (ref 150–400)
RBC: 3.46 MIL/uL — ABNORMAL LOW (ref 4.22–5.81)
RDW: 14.4 % (ref 11.5–15.5)
WBC: 7.6 10*3/uL (ref 4.0–10.5)

## 2015-02-09 LAB — MAGNESIUM: MAGNESIUM: 2.1 mg/dL (ref 1.7–2.4)

## 2015-02-09 MED ORDER — ASPIRIN 81 MG PO CHEW
81.0000 mg | CHEWABLE_TABLET | ORAL | Status: AC
  Administered 2015-02-10: 81 mg via ORAL
  Filled 2015-02-09: qty 1

## 2015-02-09 MED ORDER — SODIUM BICARBONATE BOLUS VIA INFUSION
INTRAVENOUS | Status: AC
  Administered 2015-02-10: 06:00:00 via INTRAVENOUS
  Filled 2015-02-09: qty 1

## 2015-02-09 MED ORDER — PREDNISONE 20 MG PO TABS
40.0000 mg | ORAL_TABLET | Freq: Every day | ORAL | Status: DC
  Administered 2015-02-09 – 2015-02-11 (×3): 40 mg via ORAL
  Filled 2015-02-09 (×3): qty 2

## 2015-02-09 MED ORDER — ACETAMINOPHEN 325 MG PO TABS
650.0000 mg | ORAL_TABLET | Freq: Four times a day (QID) | ORAL | Status: DC | PRN
  Administered 2015-02-10 – 2015-02-11 (×2): 650 mg via ORAL
  Filled 2015-02-09 (×2): qty 2

## 2015-02-09 MED ORDER — SODIUM BICARBONATE 8.4 % IV SOLN
INTRAVENOUS | Status: AC
  Administered 2015-02-10: 06:00:00 via INTRAVENOUS
  Filled 2015-02-09: qty 1000

## 2015-02-09 MED ORDER — SODIUM CHLORIDE 0.9 % IJ SOLN
3.0000 mL | Freq: Two times a day (BID) | INTRAMUSCULAR | Status: DC
  Administered 2015-02-09 – 2015-02-10 (×2): 3 mL via INTRAVENOUS

## 2015-02-09 MED ORDER — SODIUM CHLORIDE 0.9 % WEIGHT BASED INFUSION
1.0000 mL/kg/h | INTRAVENOUS | Status: DC
  Administered 2015-02-09: 1 mL/kg/h via INTRAVENOUS

## 2015-02-09 MED ORDER — IPRATROPIUM-ALBUTEROL 0.5-2.5 (3) MG/3ML IN SOLN
3.0000 mL | Freq: Three times a day (TID) | RESPIRATORY_TRACT | Status: DC
  Administered 2015-02-09 – 2015-02-10 (×2): 3 mL via RESPIRATORY_TRACT
  Filled 2015-02-09 (×4): qty 3

## 2015-02-09 MED ORDER — SODIUM CHLORIDE 0.9 % IV SOLN: 250.0000 mL | INTRAVENOUS | Status: DC | PRN

## 2015-02-09 MED ORDER — SODIUM CHLORIDE 0.9 % IJ SOLN: 3.0000 mL | INTRAMUSCULAR | Status: DC | PRN

## 2015-02-09 NOTE — Progress Notes (Addendum)
MEDICATION RELATED CONSULT NOTE - INITIAL   Pharmacy Consult for bicarb  Indication: prevent CIN  Allergies  Allergen Reactions  . Penicillins Anxiety    Patient Measurements: Height:  (172.7 cm) Weight: 105 lb 13.1 oz (48 kg) IBW/kg (Calculated) : 68.4 Adjusted Body Weight:  Vital Signs: Temp: 97.5 F (36.4 C) (10/09 1239) Temp Source: Oral (10/09 1239) BP: 149/93 mmHg (10/09 1239) Pulse Rate: 59 (10/09 1239) Intake/Output from previous day: 10/08 0701 - 10/09 0700 In: 820 [P.O.:820] Out: 1375 [Urine:1375] Intake/Output from this shift:    Labs:  Recent Labs  02/07/15 0757 02/08/15 0413 02/09/15 0637  WBC 9.3 9.9 7.6  HGB 10.6* 10.0* 10.4*  HCT 32.0* 30.5* 31.7*  PLT 158 167 191  CREATININE 2.10* 2.09* 1.83*  MG 2.5* 2.4 2.1  ALBUMIN 2.6* 2.4* 2.5*  PROT 5.6* 5.2* 5.4*  AST ALT 13* 11* 15*  ALKPHOS 56 49 51  BILITOT 1.0 0.9 0.8   Estimated Creatinine Clearance: 26.2 mL/min (by C-G formula based on Cr of 1.83).   Medical History: Past Medical History  Diagnosis Date  . Anxiety   . Schizophrenia [facility])     Assessment: 69 y/o male with renal insufficiency and abnormal stress test likely to go for cath. Pharmacy consulted for bicarb to prevent CIN.   Plan:  Infuse bicarb at 150 ml/hr for 1 hr - begin 1 hr prior to cath Thereafter reduce rate to 50 ml/hr and continue 6 hrs post cath There are no diurectic on the [facility] currently Pharmacy signing off  [facility], [place].D., BCPS Clinical Pharmacist Pager: [phone] 02/09/2015 7:38 PM

## 2015-02-09 NOTE — Progress Notes (Signed)
PROGRESS NOTE    Jared Austin WUJ:[id number] DOB: 1946/02/02 DOA: 02/01/2015 PCP: Avon Gully, MD  HPI/Brief narrative 69 y/o African-American male patient with history of COPD (not on home oxygen), ongoing tobacco abuse, chronic alcohol abuse, schizophrenia, anxiety, first presented to [facility] on 02/01/15 with progressively worsening dyspnea over 4-6 weeks PTA which acutely got worse 48 hours PTA. He was hypoxic at 86%. He was admitted for acute respiratory failure with hypoxia, CAP, AKI (baseline creatinine not known) and elevated troponin. He deteriorated on 02/03/15 with worsening agitation, dyspnea and hypoxia. He was intubated and transferred to ICU under CCM care. After stabilization, he was transferred to [facility] service on 02/05/15. CCM have signed off. Urology continued to follow for elevated troponin and abnormal stress test.   Assessment/Plan:  Acute Respiratory Failure with hypoxia and hypercarbia/Pulmonary Edema - Acute respiratory failure felt to be mainly from acute pulmonary edema and COPD exacerbation and less likely related to infectious etiology i.e. CAP - Required intubation and mechanical ventilation but self extubated on same day. Now extubated and stable. - Treated underlying cause. - Reassess O2 requirement prior to discharge. - As per prior evaluation, DME setup for home O2, patient will need 2 L O2 at rest, and 3 L O2 during exertion  COPD Exacerbation  - Tobacco cessation counseled. - Exacerbation resolved. - We will change IV Solu-Medrol to oral steroid taper. - Continue bronchodilator nebs. - Completed antibiotic course.  Tobacco Abuse  - Cessation counseled. Nicotine patch.  Acute diastolic CHF/pulmonary hypertension  - felt to have pulmonary edema early on in admission. Improved and stable. - metoprolol 50 mg BID -Isosorbide mononitrate 10 mg BID  Elevated troponin, continue elevation - Troponin elevation may be due to demand ischemia from acute  respiratory failure, COPD exacerbation, acute kidney injury and pneumonia. However patient has cardiac risk factors with heart score of 4. Thereby cardiology was consulted - Stress test results appreciated: Abnormal. Await cardiology follow-up regarding further recommendations. Patient does have significant renal insufficiency although improving- cath may make worse.  Essential Hypertension  - Mildly uncontrolled and fluctuating. Continue metoprolol and Imdur.  AKI - unclear baseline secondary to no information. - Slowly improving - Avoid nephrotoxic medication; unfortunately may require cardiac catheterization use contrast protocol for renal failure - We will get renal ultrasound to rule out hydronephrosis - Unclear when Foley catheter was placed-did not have PTA. DC Foley catheter and check for voiding.  Hypokalemia - Replaced.  Suspected Protein Calorie Malnutrition  -Ensure TID  Anemia, chronic disease versus iron deficiency - Stable. Anemia panel: Iron 66, TIBC 251, saturation ratio 26, ferritin 352, folate 18.1 and B12: 777.  Hx DVT -Subcutaneous heparin for prophylaxis  Schizophrenia and anxiety -Amantadine 100 mg TID -Risperdal 0.5 mg QHS - Seems to be stable.  ? ETOH Abuse  -ContinueThiamine / Folate / MVI - No overt withdrawal.  Hyperglycemia - Likely related to steroids. Reasonable inpatient control. Check A1c.   DVT Prophylaxis: SQ Heparin Code Status: FULL Family Communication: None at bedside Disposition Plan: Home 24-48 hours   Consultants: CCM-signed off  Cardiology-Dr. Lorayne Bender  Procedures/Significant Events:  Foley catheter-DC 10/9  Intubation and mechanical ventilation on 10/3-self extubated same day  2-D echo 02/04/15: Study Conclusions  - Left ventricle: The cavity size was normal. Wall thickness was normal. Systolic function was normal. The estimated ejection fraction was in the range of 50% to 55%. Wall motion was  normal; there were no regional wall motion abnormalities. Features are consistent with a pseudonormal left  ventricular filling pattern, with concomitant abnormal relaxation and increased filling pressure (grade 2 diastolic dysfunction). - Mitral valve: There was mild regurgitation. - Pulmonary arteries: Systolic pressure was moderately increased. PA peak pressure: 66 mm Hg (S).  Antibiotics: Rocephin / Azithro, start date 10/1 >> 10/3 Vanco, start date 10/3>>>10/4 Zosyn, start date 10/3>>>10/4    Subjective: Feels better. Asking to smoke. Denies chest pain or dyspnea. No home oxygen or Foley catheter prior to admission. No delusions or hallucinations.  Objective: Filed Vitals:   02/08/15 1044 02/08/15 1228 02/08/15 2022 02/09/15 0503  BP: 166/82 170/74 139/60 156/70  Pulse: 85 67    Temp:  98.1 F (36.7 C) 98.5 F (36.9 C) 98.6 F (37 C)  TempSrc:  Oral Oral Oral  Resp:  20  20  Height:      Weight:    48 kg (105 lb 13.1 oz)  SpO2:  98% 98% 98%    Intake/Output Summary (Last 24 hours) at 02/09/15 0906 Last data filed at 02/09/15 0815  Gross per 24 hour  Intake    940 ml  Output   1375 ml  Net   -435 ml   Filed Weights   02/08/15 0513 02/08/15 0740 02/09/15 0503  Weight: 48.127 kg (106 lb 1.6 oz) 47.9 kg (105 lb 9.6 oz) 48 kg (105 lb 13.1 oz)     Exam:  General exam: Pleasant middle-aged male sitting up comfortably in bed. Respiratory system: Clear. No increased work of breathing. Cardiovascular system: S1 & S2 heard, RRR. No JVD, murmurs, gallops, clicks or pedal edema. Telemetry: Sinus bradycardia in the 50s-sinus rhythm. Gastrointestinal system: Abdomen is nondistended, soft and nontender. Normal bowel sounds heard. Foley catheter +. Central nervous system: Alert and oriented. No focal neurological deficits. Extremities: Symmetric 5 x 5 power.   Data Reviewed: Basic Metabolic Panel:  Recent Labs Lab 02/03/15 1557 02/04/15 0318 02/05/15 0300  02/06/15 0521 02/07/15 0757 02/08/15 0413 02/09/15 0637  NA  --  137 139 142 139 139 140  K  --  3.8 3.7 4.3 4.2 3.9 4.3  CL  --  103 100* 101 98* 101 100*  CO2  --  GLUCOSE  --  166* 128* 104* 84 107* 131*  BUN  --  52* 57* 65* 60* 59* 52*  CREATININE  --  2.28* 2.32* 2.27* 2.10* 2.09* 1.83*  CALCIUM  --  9.4 9.4 9.4 8.8* 8.9 9.0  MG 2.4 2.3 2.4  --  2.5* 2.4 2.1  PHOS 5.5* 4.9* 3.4  --   --   --   --    Liver Function Tests:  Recent Labs Lab 02/03/15 0835 02/07/15 0757 02/08/15 0413 02/09/15 0637  AST 34 ALT 18 13* 11* 15*  ALKPHOS 75 56 49 51  BILITOT 0.7 1.0 0.9 0.8  PROT 6.9 5.6* 5.2* 5.4*  ALBUMIN 3.0* 2.6* 2.4* 2.5*   No results for input(s): LIPASE, AMYLASE in the last 168 hours.  Recent Labs Lab 02/03/15 0835  AMMONIA 29   CBC:  Recent Labs Lab 02/03/15 0835 02/04/15 0318 02/06/15 0521 02/07/15 0757 02/08/15 0413 02/09/15 0637  WBC 16.7* 11.6* 12.4* 9.3 9.9 7.6  NEUTROABS 15.8*  --   --  7.7 8.2* 7.1  HGB 11.6* 10.7* 10.7* 10.6* 10.0* 10.4*  HCT 34.0* 31.7* 32.0* 32.0* 30.5* 31.7*  MCV 90.7 90.3 90.1 91.2 92.4 91.6  PLT 228 174 172 158 167 191   Cardiac Enzymes:  Recent Labs Lab 02/03/15 0835 02/06/15 1930 02/07/15 0118 02/07/15 0757  TROPONINI 0.20* 0.31* 0.27* 0.28*   BNP (last 3 results) No results for input(s): PROBNP in the last 8760 hours. CBG:  Recent Labs Lab 02/03/15 2351 02/04/15 0338 02/04/15 1307 02/05/15 0009 02/05/15 0359  GLUCAP 182* 176* 163* 146* 138*    Recent Results (from the past 240 hour(s))  Culture, respiratory (NON-Expectorated)     Status: None   Collection Time: 02/03/15 12:39 PM  Result Value Ref Range Status   Specimen Description TRACHEAL ASPIRATE  Final   Special Requests NONE  Final   Gram Stain   Final    RARE WBC PRESENT,BOTH PMN AND MONONUCLEAR NO SQUAMOUS EPITHELIAL CELLS SEEN NO ORGANISMS SEEN Performed at [facility]    Culture   Final     RARE YEAST CONSISTENT WITH CANDIDA SPECIES Performed at [facility]    Report Status 02/06/2015 FINAL  Final  MRSA PCR Screening     Status: None   Collection Time: 02/03/15  3:00 PM  Result Value Ref Range Status   MRSA by PCR NEGATIVE NEGATIVE Final    Comment:        The GeneXpert MRSA Assay (FDA approved for NASAL specimens only), is one component of a comprehensive MRSA colonization surveillance program. It is not intended to diagnose MRSA infection nor to guide or monitor treatment for MRSA infections.   Culture, respiratory (NON-Expectorated)     Status: None   Collection Time: 02/03/15  3:15 PM  Result Value Ref Range Status   Specimen Description ENDOTRACHEAL  Final   Special Requests NONE  Final   Gram Stain   Final    RARE WBC PRESENT, PREDOMINANTLY PMN RARE SQUAMOUS EPITHELIAL CELLS PRESENT NO ORGANISMS SEEN Performed at [facility]    Culture   Final    RARE CANDIDA ALBICANS Performed at [facility]    Report Status 02/06/2015 FINAL  Final  MRSA PCR Screening     Status: None   Collection Time: 02/03/15  4:45 PM  Result Value Ref Range Status   MRSA by PCR NEGATIVE NEGATIVE Final    Comment:        The GeneXpert MRSA Assay (FDA approved for NASAL specimens only), is one component of a comprehensive MRSA colonization surveillance program. It is not intended to diagnose MRSA infection nor to guide or monitor treatment for MRSA infections.          Studies: Nm Myocar Multi W/spect W/wall Motion / Ef  02/08/2015   CLINICAL DATA:  69 year old who presented this admission with acute onset of shortness of breath to to an interstitial pneumonitis or interstitial edema. Patient also complains of chest discomfort and pain. Abnormal resting EKG demonstrating right bundle-branch block.  EXAM: MYOCARDIAL IMAGING WITH SPECT (REST AND PHARMACOLOGIC-STRESS)  GATED LEFT VENTRICULAR WALL MOTION STUDY  LEFT VENTRICULAR EJECTION FRACTION   TECHNIQUE: Standard myocardial SPECT imaging was performed after resting intravenous injection of 10 mCi Tc-62m sestamibi. Subsequently, intravenous infusion of Lexiscan was performed under the supervision of the Cardiology staff. At peak effect of the drug, 30 mCi Tc-56m sestamibi was injected intravenously and standard myocardial SPECT imaging was performed. Quantitative gated imaging was also performed to evaluate left ventricular wall motion, and estimate left ventricular ejection fraction.  COMPARISON:  None.  FINDINGS: Perfusion: Artifactually increased activity involving the inferoapical region due to subdiaphragmatic gut activity on the stress images, less prominent on the resting images. Small perfusion defect  involving the apex on the stress images with slight reversibility on the resting images. Small perfusion defect involving the distal anterior wall without reversibility.  Wall Motion: Mild inferior wall hypokinesis. No left ventricular dilation.  Left Ventricular Ejection Fraction: 46%  End diastolic volume 119 ml  End systolic volume 64 ml  IMPRESSION: 1. Artifact related to subdiaphragmatic activity in the inferoapical region on the stress images and to a lesser degree the resting images.  Small reversible defect involving the apex indicating possible ischemia.  Small irreversible defect involving the distal anterior wall.  2. Mild inferior wall hypokinesis.  3. Left ventricular ejection fraction 46%  4. Intermediate-risk stress test findings*.  *2012 Appropriate Use Criteria for Coronary Revascularization Focused Update: J Am Coll Cardiol. 2012;59(9):857-881. http://content.dementiazones.com.aspx?articleid=1201161   Electronically Signed   By: Hulan Saas M.D.   On: 02/08/2015 16:19        Scheduled Meds: . amantadine  100 mg Oral TID  . antiseptic oral rinse  7 mL Mouth Rinse BID  . atorvastatin  10 mg Oral q1800  . feeding supplement  1 Container Oral Q24H  . feeding  supplement (ENSURE ENLIVE)  237 mL Oral TID BM  . folic acid  1 mg Oral Daily  . heparin subcutaneous  5,000 Units Subcutaneous 3 times per day  . ipratropium-albuterol  3 mL Nebulization TID  . isosorbide mononitrate  10 mg Oral BID  . methylPREDNISolone (SOLU-MEDROL) injection  40 mg Intravenous Q6H  . metoprolol tartrate  50 mg Oral BID  . multivitamin with minerals  1 tablet Oral Daily  . nicotine  21 mg Transdermal Daily  . pantoprazole  40 mg Oral Daily  . risperiDONE  0.5 mg Oral QHS  . thiamine  100 mg Oral Daily   Continuous Infusions: . sodium chloride 10 mL/hr at 02/05/15 2140    Active Problems:   CAP (community acquired pneumonia)   Acute respiratory failure with hypoxia (HCC)   Elevated troponin   Acute kidney failure (HCC)   HCAP (healthcare-associated pneumonia)   Pulmonary edema   Acute respiratory failure with hypercapnia (HCC)   Acute pulmonary edema (HCC)   COPD exacerbation (HCC)   Tobacco abuse   Chronic diastolic CHF (congestive heart failure) (HCC)   Pulmonary hypertension (HCC)   Acute kidney injury (HCC)   Essential hypertension   Schizophrenia (HCC)   Anxiety state   Acute coronary syndrome (HCC)    Time spent: 40 minutes    Bijou Easler, MD, FACP, FHM. [facility] Pager [phone]  If 7PM-7AM, please contact night-coverage [email or web address] Password [email or web address] 02/09/2015, 9:06 AM    LOS: 8 days

## 2015-02-09 NOTE — Progress Notes (Signed)
Subjective:  Denies any chest pain complains of shortness of breath with minimal exertion. Nuclear stress test showed small area of reversible defect in the distal anterior wall and apex with EF of 46%  Objective:  Vital Signs in the last 24 hours: Temp:  [98.1 F (36.7 C)-98.6 F (37 C)] 98.6 F (37 C) (10/09 0503) Pulse Rate:  [67-73] 73 (10/09 0929) Resp:  [20] 20 (10/09 0503) BP: (139-170)/(60-74) 152/63 mmHg (10/09 0929) SpO2:  [98 %] 98 % (10/09 0503) Weight:  [48 kg (105 lb 13.1 oz)] 48 kg (105 lb 13.1 oz) (10/09 0503)  Intake/Output from previous day: 10/08 0701 - 10/09 0700 In: 820 [P.O.:820] Out: 1375 [Urine:1375] Intake/Output from this shift: Total I/O In: 360 [P.O.:360] Out: 400 [Urine:400]  Physical Exam: Neck: no adenopathy, no carotid bruit, no JVD and supple, symmetrical, trachea midline Lungs: clear to auscultation bilaterally Heart: regular rate and rhythm, S1, S2 normal and 2/6 systolic murmur noted Abdomen: soft, non-tender; bowel sounds normal; no masses,  no organomegaly Extremities: extremities normal, atraumatic, no cyanosis or edema  Lab Results:  Recent Labs  02/08/15 0413 02/09/15 0637  WBC 9.9 7.6  HGB 10.0* 10.4*  PLT 167 191    Recent Labs  02/08/15 0413 02/09/15 0637  NA 139 140  K 3.9 4.3  CL 101 100*  CO2 29 28  GLUCOSE 107* 131*  BUN 59* 52*  CREATININE 2.09* 1.83*    Recent Labs  02/07/15 0118 02/07/15 0757  TROPONINI 0.27* 0.28*   Hepatic Function Panel  Recent Labs  02/09/15 0637  PROT 5.4*  ALBUMIN 2.5*  AST 18  ALT 15*  ALKPHOS 51  BILITOT 0.8   No results for input(s): CHOL in the last 72 hours. No results for input(s): PROTIME in the last 72 hours.  Imaging: Imaging results have been reviewed and Nm Myocar Multi W/spect W/wall Motion / Ef  02/08/2015   CLINICAL DATA:  69 year old who presented this admission with acute onset of shortness of breath to to an interstitial pneumonitis or interstitial  edema. Patient also complains of chest discomfort and pain. Abnormal resting EKG demonstrating right bundle-branch block.  EXAM: MYOCARDIAL IMAGING WITH SPECT (REST AND PHARMACOLOGIC-STRESS)  GATED LEFT VENTRICULAR WALL MOTION STUDY  LEFT VENTRICULAR EJECTION FRACTION  TECHNIQUE: Standard myocardial SPECT imaging was performed after resting intravenous injection of 10 mCi Tc-54m sestamibi. Subsequently, intravenous infusion of Lexiscan was performed under the supervision of the Cardiology staff. At peak effect of the drug, 30 mCi Tc-72m sestamibi was injected intravenously and standard myocardial SPECT imaging was performed. Quantitative gated imaging was also performed to evaluate left ventricular wall motion, and estimate left ventricular ejection fraction.  COMPARISON:  None.  FINDINGS: Perfusion: Artifactually increased activity involving the inferoapical region due to subdiaphragmatic gut activity on the stress images, less prominent on the resting images. Small perfusion defect involving the apex on the stress images with slight reversibility on the resting images. Small perfusion defect involving the distal anterior wall without reversibility.  Wall Motion: Mild inferior wall hypokinesis. No left ventricular dilation.  Left Ventricular Ejection Fraction: 46%  End diastolic volume 119 ml  End systolic volume 64 ml  IMPRESSION: 1. Artifact related to subdiaphragmatic activity in the inferoapical region on the stress images and to a lesser degree the resting images.  Small reversible defect involving the apex indicating possible ischemia.  Small irreversible defect involving the distal anterior wall.  2. Mild inferior wall hypokinesis.  3. Left ventricular ejection fraction 46%  4. Intermediate-risk stress test findings*.  *2012 Appropriate Use Criteria for Coronary Revascularization Focused Update: J Am Coll Cardiol. 2012;59(9):857-881. http://content.dementiazones.com.aspx?articleid=1201161    Electronically Signed   By: Hulan Saas M.D.   On: 02/08/2015 16:19    Cardiac Studies:  Assessment/Plan:  Status post probable very small non-Q-wave MI secondary to demand ischemia rule out CAD Acute diastolic heart failure COPD Moderate pulmonary hypertension Abnormal cardiac enzymes Protein calorie malnutrition Acute kidney injury Anxiety Schizophrenia Tobacco use disorder Plan Discussed with patient at length regarding nuclear stress test result and various options of treatment i.e. medical versus cardiac catheterization possible PTCA stenting its risk and benefits i.e. death MI stroke need for emergency CABG local vascular complications worsening renal function/staging the procedure etc. and consents for PCI  LOS: 8 days    Rinaldo Cloud 02/09/2015, 11:10 AM

## 2015-02-10 ENCOUNTER — Encounter (HOSPITAL_COMMUNITY)
Admission: EM | Disposition: A | Discharge status: Home Health | Payer: Self-pay | Source: Home / Self Care | Attending: Internal Medicine

## 2015-02-10 HISTORY — PX: CARDIAC CATHETERIZATION: SHX172

## 2015-02-10 LAB — BASIC METABOLIC PANEL
Anion gap: 9 (ref 5–15)
BUN: 59 mg/dL — AB (ref 6–20)
CALCIUM: 8.8 mg/dL — AB (ref 8.9–10.3)
CO2: 27 mmol/L (ref 22–32)
CREATININE: 1.87 mg/dL — AB (ref 0.61–1.24)
Chloride: 101 mmol/L (ref 101–111)
GFR calc non Af Amer: 35 mL/min — ABNORMAL LOW (ref >60)
GFR, EST AFRICAN AMERICAN: 41 mL/min — AB (ref >60)
Glucose, Bld: 139 mg/dL — ABNORMAL HIGH (ref 65–99)
Potassium: 4 mmol/L (ref 3.5–5.1)
Sodium: 137 mmol/L (ref 135–145)

## 2015-02-10 LAB — PROTIME-INR
INR: 1.19 (ref 0.00–1.49)
Prothrombin Time: 15.3 seconds — ABNORMAL HIGH (ref 11.6–15.2)

## 2015-02-10 SURGERY — LEFT HEART CATH AND CORONARY ANGIOGRAPHY
Anesthesia: LOCAL

## 2015-02-10 MED ORDER — SODIUM CHLORIDE 0.9 % IV SOLN: 250.0000 mL | INTRAVENOUS | Status: DC | PRN

## 2015-02-10 MED ORDER — NITROGLYCERIN 1 MG/10 ML FOR IR/CATH LAB
INTRA_ARTERIAL | Status: AC
  Filled 2015-02-10: qty 10

## 2015-02-10 MED ORDER — FENTANYL CITRATE (PF) 100 MCG/2ML IJ SOLN
INTRAMUSCULAR | Status: DC | PRN
  Administered 2015-02-10: 25 ug via INTRAVENOUS

## 2015-02-10 MED ORDER — MIDAZOLAM HCL 2 MG/2ML IJ SOLN
INTRAMUSCULAR | Status: AC
  Filled 2015-02-10: qty 4

## 2015-02-10 MED ORDER — HEPARIN (PORCINE) IN NACL 2-0.9 UNIT/ML-% IJ SOLN
INTRAMUSCULAR | Status: AC
  Filled 2015-02-10: qty 1500

## 2015-02-10 MED ORDER — FENTANYL CITRATE (PF) 100 MCG/2ML IJ SOLN
INTRAMUSCULAR | Status: AC
  Filled 2015-02-10: qty 4

## 2015-02-10 MED ORDER — IOHEXOL 350 MG/ML SOLN
INTRAVENOUS | Status: DC | PRN
  Administered 2015-02-10: 30 mL via INTRAVENOUS

## 2015-02-10 MED ORDER — SODIUM CHLORIDE 0.9 % IJ SOLN
3.0000 mL | Freq: Two times a day (BID) | INTRAMUSCULAR | Status: DC
Start: 2015-02-10 — End: 2015-02-11
  Administered 2015-02-10 (×2): 3 mL via INTRAVENOUS

## 2015-02-10 MED ORDER — MIDAZOLAM HCL 2 MG/2ML IJ SOLN
INTRAMUSCULAR | Status: DC | PRN
  Administered 2015-02-10: 1 mg via INTRAVENOUS

## 2015-02-10 MED ORDER — SODIUM CHLORIDE 0.9 % IV SOLN: INTRAVENOUS | Status: AC

## 2015-02-10 MED ORDER — SODIUM CHLORIDE 0.9 % IJ SOLN: 3.0000 mL | INTRAMUSCULAR | Status: DC | PRN

## 2015-02-10 MED ORDER — ONDANSETRON HCL 4 MG/2ML IJ SOLN: 4.0000 mg | Freq: Four times a day (QID) | INTRAMUSCULAR | Status: DC | PRN

## 2015-02-10 MED ORDER — LIDOCAINE HCL (PF) 1 % IJ SOLN
INTRAMUSCULAR | Status: AC
  Filled 2015-02-10: qty 30

## 2015-02-10 SURGICAL SUPPLY — 7 items
CATH INFINITI 5FR MULTPACK ANG (CATHETERS) ×2 IMPLANT
KIT HEART LEFT (KITS) ×2 IMPLANT
PACK CARDIAC CATHETERIZATION (CUSTOM PROCEDURE TRAY) ×2 IMPLANT
SHEATH PINNACLE 5F 10CM (SHEATH) ×2 IMPLANT
SYR MEDRAD MARK V 150ML (SYRINGE) ×2 IMPLANT
TRANSDUCER W/STOPCOCK (MISCELLANEOUS) ×2 IMPLANT
WIRE EMERALD 3MM-J .035X150CM (WIRE) ×2 IMPLANT

## 2015-02-10 NOTE — Interval H&P Note (Signed)
History and Physical Interval Note:  02/10/2015 9:15 AM  Ithan Coulthard  has presented today for surgery, with the diagnosis of NSTEMI  The various methods of treatment have been discussed with the patient and family. After consideration of risks, benefits and other options for treatment, the patient has consented to  Procedure(s): Left Heart Cath and Coronary Angiography (N/A) as a surgical intervention .  The patient's history has been reviewed, patient examined, no change in status, stable for surgery.  I have reviewed the patient's chart and labs.  Questions were answered to the patient's satisfaction.     Haldon Carley S

## 2015-02-10 NOTE — Progress Notes (Signed)
PROGRESS NOTE    Jared Austin QIO:[id number] DOB: 11-30-1945 DOA: 02/01/2015 PCP: Avon Gully, MD  HPI/Brief narrative 69 y/o African-American male patient with history of COPD (not on home oxygen), ongoing tobacco abuse, chronic alcohol abuse, schizophrenia, anxiety, first presented to [facility] on 02/01/15 with progressively worsening dyspnea over 4-6 weeks PTA which acutely got worse 48 hours PTA. He was hypoxic at 86%. He was admitted for acute respiratory failure with hypoxia, CAP, AKI (baseline creatinine not known) and elevated troponin. He deteriorated on 02/03/15 with worsening agitation, dyspnea and hypoxia. He was intubated and transferred to ICU under CCM care. After stabilization, he was transferred to [facility] service on 02/05/15. CCM have signed off. Cardiology continue to follow for elevated troponin and abnormal stress test > for cardiac cath 10/10.   Assessment/Plan:  Acute Respiratory Failure with hypoxia and hypercarbia/Pulmonary Edema - Acute respiratory failure felt to be mainly from acute pulmonary edema and COPD exacerbation and less likely related to infectious etiology i.e. CAP - Required intubation and mechanical ventilation but self extubated on same day. Now extubated and stable. - Treated underlying cause. - Reassess O2 requirement prior to discharge. - As per prior evaluation, DME setup for home O2, patient will need 2 L O2 at rest, and 3 L O2 during exertion - As per RN, able to titrate down to 2.5L/min  COPD Exacerbation  - Tobacco cessation counseled. - Exacerbation resolved. - changed IV Solu-Medrol to oral steroid taper. - Continue bronchodilator nebs. - Completed antibiotic course.  Tobacco Abuse  - Cessation counseled. Nicotine patch.  Acute diastolic CHF/pulmonary hypertension  - felt to have pulmonary edema early on in admission. Improved and stable. - metoprolol 50 mg BID - Isosorbide mononitrate 10 mg BID  Elevated troponin, continue elevation -  Troponin elevation may be due to demand ischemia from acute respiratory failure, COPD exacerbation, acute kidney injury and pneumonia. However patient has cardiac risk factors with heart score of 4. Thereby cardiology was consulted - Stress test results appreciated: Abnormal. Patient does have significant renal insufficiency although improving- cath may make worse. - Discussed with Dr. Sharyn Lull, Cardiology on 10/9 re risks of worsening renal insufficiency from Cath> he advised that patient would be hydrated overnight last night, receive bicarb and use mininal dose contrast to minimize risks. - Awaiting cath 10/10  Essential Hypertension  - Continue metoprolol and Imdur. Reasonable control.  AKI - unclear baseline secondary to no information. More likeley AoCKD - Slowly improving - Avoid nephrotoxic medication; unfortunately may require cardiac catheterization use contrast protocol for renal failure - We will get renal ultrasound to rule out hydronephrosis - Unclear when Foley catheter was placed-did not have PTA. DC Foley catheter 10/9> voiding well per pt and RN  Hypokalemia - Replaced.  Suspected Protein Calorie Malnutrition  -Ensure TID  Anemia, chronic disease versus iron deficiency - Stable. Anemia panel: Iron 66, TIBC 251, saturation ratio 26, ferritin 352, folate 18.1 and B12: 777.  Hx DVT -Subcutaneous heparin for prophylaxis  Schizophrenia and anxiety -Amantadine 100 mg TID -Risperdal 0.5 mg QHS - Seems to be stable.  ? ETOH Abuse  -ContinueThiamine / Folate / MVI - No overt withdrawal.  Hyperglycemia - Likely related to steroids. Reasonable inpatient control. Check A1c.   DVT Prophylaxis: SQ Heparin Code Status: FULL Family Communication: None at bedside Disposition Plan: Home when cleared by Cards   Consultants: CCM-signed off  Cardiology-Dr. Lorayne Bender  Procedures/Significant Events:  Foley catheter-DC 10/9  Intubation and mechanical ventilation  on 10/3-self extubated same  day  2-D echo 02/04/15: Study Conclusions  - Left ventricle: The cavity size was normal. Wall thickness was normal. Systolic function was normal. The estimated ejection fraction was in the range of 50% to 55%. Wall motion was normal; there were no regional wall motion abnormalities. Features are consistent with a pseudonormal left ventricular filling pattern, with concomitant abnormal relaxation and increased filling pressure (grade 2 diastolic dysfunction). - Mitral valve: There was mild regurgitation. - Pulmonary arteries: Systolic pressure was moderately increased. PA peak pressure: 66 mm Hg (S).  Antibiotics: Rocephin / Azithro, start date 10/1 >> 10/3 Vanco, start date 10/3>>>10/4 Zosyn, start date 10/3>>>10/4    Subjective: Denies complaints. Voiding post foley removal. Seen before cath this AM.  Objective: Filed Vitals:   02/09/15 2115 02/10/15 0534 02/10/15 0831 02/10/15 0902  BP: 134/65 144/68    Pulse: 69 68    Temp: 98.7 F (37.1 C) 98.3 F (36.8 C)    TempSrc: Oral Oral    Resp: 20 20    Height:      Weight:  49.868 kg (109 lb 15 oz)    SpO2: 98% 98% 97% 97%    Intake/Output Summary (Last 24 hours) at 02/10/15 0908 Last data filed at 02/10/15 0832  Gross per 24 hour  Intake 1646.9 ml  Output    950 ml  Net  696.9 ml   Filed Weights   02/08/15 0740 02/09/15 0503 02/10/15 0534  Weight: 47.9 kg (105 lb 9.6 oz) 48 kg (105 lb 13.1 oz) 49.868 kg (109 lb 15 oz)     Exam:  General exam: Pleasant middle-aged male sitting up comfortably in bed. Respiratory system: Clear. No increased work of breathing. Cardiovascular system: S1 & S2 heard, RRR. No JVD, murmurs, gallops, clicks or pedal edema. Telemetry: Sinus bradycardia in the mid 50s-sinus rhythm. Gastrointestinal system: Abdomen is nondistended, soft and nontender. Normal bowel sounds heard.  Central nervous system: Alert and oriented. No focal neurological  deficits. Extremities: Symmetric 5 x 5 power.   Data Reviewed: Basic Metabolic Panel:  Recent Labs Lab 02/03/15 1557  02/04/15 0318 02/05/15 0300 02/06/15 0521 02/07/15 0757 02/08/15 0413 02/09/15 0637 02/10/15 0402  NA  --   < > 137 139 142 139 139 140 137  K  --   < > 3.8 3.7 4.3 4.2 3.9 4.3 4.0  CL  --   < > 103 100* 101 98* 101 100* 101  CO2  --   < > 25 27 30 30 29 28 27   GLUCOSE  --   < > 166* 128* 104* 84 107* 131* 139*  BUN  --   < > 52* 57* 65* 60* 59* 52* 59*  CREATININE  --   < > 2.28* 2.32* 2.27* 2.10* 2.09* 1.83* 1.87*  CALCIUM  --   < > 9.4 9.4 9.4 8.8* 8.9 9.0 8.8*  MG 2.4  --  2.3 2.4  --  2.5* 2.4 2.1  --   PHOS 5.5*  --  4.9* 3.4  --   --   --   --   --   < > = values in this interval not displayed. Liver Function Tests:  Recent Labs Lab 02/07/15 0757 02/08/15 0413 02/09/15 0637  AST 18 17 18   ALT 13* 11* 15*  ALKPHOS 56 49 51  BILITOT 1.0 0.9 0.8  PROT 5.6* 5.2* 5.4*  ALBUMIN 2.6* 2.4* 2.5*   No results for input(s): LIPASE, AMYLASE in the last 168 hours. No results  for input(s): AMMONIA in the last 168 hours. CBC:  Recent Labs Lab 02/04/15 0318 02/06/15 0521 02/07/15 0757 02/08/15 0413 02/09/15 0637  WBC 11.6* 12.4* 9.3 9.9 7.6  NEUTROABS  --   --  7.7 8.2* 7.1  HGB 10.7* 10.7* 10.6* 10.0* 10.4*  HCT 31.7* 32.0* 32.0* 30.5* 31.7*  MCV 90.3 90.1 91.2 92.4 91.6  PLT 174 172 158 167 191   Cardiac Enzymes:  Recent Labs Lab 02/06/15 1930 02/07/15 0118 02/07/15 0757  TROPONINI 0.31* 0.27* 0.28*   BNP (last 3 results) No results for input(s): PROBNP in the last 8760 hours. CBG:  Recent Labs Lab 02/03/15 2351 02/04/15 0338 02/04/15 1307 02/05/15 0009 02/05/15 0359  GLUCAP 182* 176* 163* 146* 138*    Recent Results (from the past 240 hour(s))  Culture, respiratory (NON-Expectorated)     Status: None   Collection Time: 02/03/15 12:39 PM  Result Value Ref Range Status   Specimen Description TRACHEAL ASPIRATE  Final    Special Requests NONE  Final   Gram Stain   Final    RARE WBC PRESENT,BOTH PMN AND MONONUCLEAR NO SQUAMOUS EPITHELIAL CELLS SEEN NO ORGANISMS SEEN Performed at [facility]    Culture   Final    RARE YEAST CONSISTENT WITH CANDIDA SPECIES Performed at [facility]    Report Status 02/06/2015 FINAL  Final  MRSA PCR Screening     Status: None   Collection Time: 02/03/15  3:00 PM  Result Value Ref Range Status   MRSA by PCR NEGATIVE NEGATIVE Final    Comment:        The GeneXpert MRSA Assay (FDA approved for NASAL specimens only), is one component of a comprehensive MRSA colonization surveillance program. It is not intended to diagnose MRSA infection nor to guide or monitor treatment for MRSA infections.   Culture, respiratory (NON-Expectorated)     Status: None   Collection Time: 02/03/15  3:15 PM  Result Value Ref Range Status   Specimen Description ENDOTRACHEAL  Final   Special Requests NONE  Final   Gram Stain   Final    RARE WBC PRESENT, PREDOMINANTLY PMN RARE SQUAMOUS EPITHELIAL CELLS PRESENT NO ORGANISMS SEEN Performed at [facility]    Culture   Final    RARE CANDIDA ALBICANS Performed at [facility]    Report Status 02/06/2015 FINAL  Final  MRSA PCR Screening     Status: None   Collection Time: 02/03/15  4:45 PM  Result Value Ref Range Status   MRSA by PCR NEGATIVE NEGATIVE Final    Comment:        The GeneXpert MRSA Assay (FDA approved for NASAL specimens only), is one component of a comprehensive MRSA colonization surveillance program. It is not intended to diagnose MRSA infection nor to guide or monitor treatment for MRSA infections.          Studies: US Renal  02/09/2015   CLINICAL DATA:  Patient with acute renal failure.  EXAM: RENAL / URINARY TRACT ULTRASOUND COMPLETE  COMPARISON:  None.  FINDINGS: Right Kidney:  Length: 9.7 cm. Mildly increased renal cortical echogenicity. No hydronephrosis. Normal renal  cortical thickness.  Left Kidney:  Length: 9.3 cm. Mildly increased renal cortical echogenicity. No hydronephrosis. Normal renal cortical thickness. There is a sub cm hypoechoic lesion within left kidney too small to characterize however likely represents a small cyst.  Bladder:  Appears normal for degree of bladder distention.  IMPRESSION: No hydronephrosis.  Mildly echogenic renal  cortical parenchyma bilaterally as can be seen with chronic medical renal disease.   Electronically Signed   By: Annia Belt M.D.   On: 02/09/2015 14:28   Nm Myocar Multi W/spect W/wall Motion / Ef  02/08/2015   CLINICAL DATA:  69 year old who presented this admission with acute onset of shortness of breath to to an interstitial pneumonitis or interstitial edema. Patient also complains of chest discomfort and pain. Abnormal resting EKG demonstrating right bundle-branch block.  EXAM: MYOCARDIAL IMAGING WITH SPECT (REST AND PHARMACOLOGIC-STRESS)  GATED LEFT VENTRICULAR WALL MOTION STUDY  LEFT VENTRICULAR EJECTION FRACTION  TECHNIQUE: Standard myocardial SPECT imaging was performed after resting intravenous injection of 10 mCi Tc-59m sestamibi. Subsequently, intravenous infusion of Lexiscan was performed under the supervision of the Cardiology staff. At peak effect of the drug, 30 mCi Tc-14m sestamibi was injected intravenously and standard myocardial SPECT imaging was performed. Quantitative gated imaging was also performed to evaluate left ventricular wall motion, and estimate left ventricular ejection fraction.  COMPARISON:  None.  FINDINGS: Perfusion: Artifactually increased activity involving the inferoapical region due to subdiaphragmatic gut activity on the stress images, less prominent on the resting images. Small perfusion defect involving the apex on the stress images with slight reversibility on the resting images. Small perfusion defect involving the distal anterior wall without reversibility.  Wall Motion: Mild inferior wall  hypokinesis. No left ventricular dilation.  Left Ventricular Ejection Fraction: 46%  End diastolic volume 119 ml  End systolic volume 64 ml  IMPRESSION: 1. Artifact related to subdiaphragmatic activity in the inferoapical region on the stress images and to a lesser degree the resting images.  Small reversible defect involving the apex indicating possible ischemia.  Small irreversible defect involving the distal anterior wall.  2. Mild inferior wall hypokinesis.  3. Left ventricular ejection fraction 46%  4. Intermediate-risk stress test findings*.  *2012 Appropriate Use Criteria for Coronary Revascularization Focused Update: J Am Coll Cardiol. 2012;59(9):857-881. http://content.dementiazones.com.aspx?articleid=1201161   Electronically Signed   By: Hulan Saas M.D.   On: 02/08/2015 16:19        Scheduled Meds: . [MAR Hold] amantadine  100 mg Oral TID  . [MAR Hold] antiseptic oral rinse  7 mL Mouth Rinse BID  . [MAR Hold] atorvastatin  10 mg Oral q1800  . [MAR Hold] feeding supplement  1 Container Oral Q24H  . [MAR Hold] feeding supplement (ENSURE ENLIVE)  237 mL Oral TID BM  . [MAR Hold] folic acid  1 mg Oral Daily  . [MAR Hold] heparin subcutaneous  5,000 Units Subcutaneous 3 times per day  . [MAR Hold] ipratropium-albuterol  3 mL Nebulization TID  . [MAR Hold] isosorbide mononitrate  10 mg Oral BID  . [MAR Hold] metoprolol tartrate  50 mg Oral BID  . [MAR Hold] multivitamin with minerals  1 tablet Oral Daily  . [MAR Hold] nicotine  21 mg Transdermal Daily  . [MAR Hold] pantoprazole  40 mg Oral Daily  . [MAR Hold] predniSONE  40 mg Oral Q breakfast  . [MAR Hold] risperiDONE  0.5 mg Oral QHS  . sodium chloride  3 mL Intravenous Q12H  . [MAR Hold] thiamine  100 mg Oral Daily   Continuous Infusions: . sodium chloride 10 mL/hr at 02/05/15 2140  . sodium chloride 1 mL/kg/hr (02/09/15 2054)    Active Problems:   CAP (community acquired pneumonia)   Acute respiratory failure with  hypoxia (HCC)   Elevated troponin   Acute kidney failure (HCC)   HCAP (healthcare-associated pneumonia)  Pulmonary edema   Acute respiratory failure with hypercapnia (HCC)   Acute pulmonary edema (HCC)   COPD exacerbation (HCC)   Tobacco abuse   Chronic diastolic CHF (congestive heart failure) (HCC)   Pulmonary hypertension (HCC)   Acute kidney injury (HCC)   Essential hypertension   Schizophrenia (HCC)   Anxiety state   Acute coronary syndrome (HCC)    Time spent: 20 minutes    Tahani Potier, MD, FACP, FHM. [facility] Pager [phone]  If 7PM-7AM, please contact night-coverage [email or web address] Password [email or web address] 02/10/2015, 9:08 AM    LOS: 9 days

## 2015-02-10 NOTE — Progress Notes (Addendum)
Site area: RFA Site Prior to Removal:  Level  0 Pressure Applied For:45min Manual:   yes Patient Status During Pull:  stable Post Pull Site:  Level 0   Post Pull Instructions Given: yes  Post Pull Pulses Present: palpable Dressing Applied:  yes Bedrest begins @ 1035 till 1435 Comments: removed by Sandi  [place] Area below site is raised but soft-no change With additional pressure

## 2015-02-10 NOTE — H&P (View-Only) (Signed)
Referring Physician:  [name] is an 69 y.o. male.                       Chief Complaint: Abnormal troponin I  HPI: 69 year old male with shortness of breath with activity has no chest pain but minimally elevated troponin I along with history of smoking and hypertension. Echocardiogram showed normal systolic and moderate diastolic dysfunction and moderate pulmonary hypertension.  Past Medical History  Diagnosis Date  . Anxiety   . Schizophrenia ([place])       History reviewed. No pertinent past surgical history.  History reviewed. No pertinent family history. Social History:  reports that he has been smoking Cigarettes.  He has a 93 pack-year smoking history. He does not have any smokeless tobacco history on file. He reports that he does not drink alcohol or use illicit drugs.  Allergies:  Allergies  Allergen Reactions  . Penicillins Anxiety    Medications Prior to Admission  Medication Sig Dispense Refill  . amantadine (SYMMETREL) 100 MG capsule Take 100 mg by mouth 3 (three) times daily.    . Aspirin-Salicylamide-Caffeine (BC HEADACHE POWDER PO) Take 1 packet by mouth every 6 (six) hours as needed (Pain).    Marland Kitchen atorvastatin (LIPITOR) 10 MG tablet Take 10 mg by mouth daily.    Lorayne Bender SUSTENNA 156 MG/ML SUSP injection Inject 156 mg as directed every 6 (six) weeks.       Results for orders placed or performed during the hospital encounter of 02/01/15 (from the past 48 hour(s))  Procalcitonin     Status: None   Collection Time: 02/06/15  5:21 AM  Result Value Ref Range   Procalcitonin 0.30 ng/mL    Comment:        Interpretation: PCT (Procalcitonin) <= 0.5 ng/mL: Systemic infection (sepsis) is not likely. Local bacterial infection is possible. (NOTE)         ICU PCT Algorithm               Non ICU PCT Algorithm    ----------------------------     ------------------------------         PCT < 0.25 ng/mL                 PCT < 0.1 ng/mL     Stopping of antibiotics             Stopping of antibiotics       strongly encouraged.               strongly encouraged.    ----------------------------     ------------------------------       PCT level decrease by               PCT < 0.25 ng/mL       >= 80% from peak PCT       OR PCT 0.25 - 0.5 ng/mL          Stopping of antibiotics                                             encouraged.     Stopping of antibiotics           encouraged.    ----------------------------     ------------------------------       PCT level decrease by  PCT >= 0.25 ng/mL       < 80% from peak PCT        AND PCT >= 0.5 ng/mL            Continuin g antibiotics                                              encouraged.       Continuing antibiotics            encouraged.    ----------------------------     ------------------------------     PCT level increase compared          PCT > 0.5 ng/mL         with peak PCT AND          PCT >= 0.5 ng/mL             Escalation of antibiotics                                          strongly encouraged.      Escalation of antibiotics        strongly encouraged.   Basic metabolic panel     Status: Abnormal   Collection Time: 02/06/15  5:21 AM  Result Value Ref Range   Sodium 142 135 - 145 mmol/L   Potassium 4.3 3.5 - 5.1 mmol/L   Chloride 101 101 - 111 mmol/L   CO2 30 22 - 32 mmol/L   Glucose, Bld 104 (H) 65 - 99 mg/dL   BUN 65 (H) 6 - 20 mg/dL   Creatinine, Ser 2.27 (H) 0.61 - 1.24 mg/dL   Calcium 9.4 8.9 - 10.3 mg/dL   GFR calc non Af Amer 28 (L) >60 mL/min   GFR calc Af Amer 32 (L) >60 mL/min    Comment: (NOTE) The eGFR has been calculated using the CKD EPI equation. This calculation has not been validated in all clinical situations. eGFR's persistently <60 mL/min signify possible Chronic Kidney Disease.    Anion gap 11 5 - 15  CBC     Status: Abnormal   Collection Time: 02/06/15  5:21 AM  Result Value Ref Range   WBC 12.4 (H) 4.0 - 10.5 K/uL   RBC 3.55 (L) 4.22 - 5.81  MIL/uL   Hemoglobin 10.7 (L) 13.0 - 17.0 g/dL   HCT 32.0 (L) 39.0 - 52.0 %   MCV 90.1 78.0 - 100.0 fL   MCH 30.1 26.0 - 34.0 pg   MCHC 33.4 30.0 - 36.0 g/dL   RDW 13.9 11.5 - 15.5 %   Platelets 172 150 - 400 K/uL  Urine rapid drug screen (hosp performed)     Status: None   Collection Time: 02/06/15  6:44 PM  Result Value Ref Range   Opiates NONE DETECTED NONE DETECTED   Cocaine NONE DETECTED NONE DETECTED   Benzodiazepines NONE DETECTED NONE DETECTED   Amphetamines NONE DETECTED NONE DETECTED   Tetrahydrocannabinol NONE DETECTED NONE DETECTED   Barbiturates NONE DETECTED NONE DETECTED    Comment:        DRUG SCREEN FOR MEDICAL PURPOSES ONLY.  IF CONFIRMATION IS NEEDED FOR ANY PURPOSE, NOTIFY LAB WITHIN 5 DAYS.        LOWEST DETECTABLE LIMITS  FOR URINE DRUG SCREEN Drug Class       Cutoff (ng/mL) Amphetamine      1000 Barbiturate      200 Benzodiazepine   834 Tricyclics       196 Opiates          300 Cocaine          300 THC              50   Troponin I (q 6hr x 3)     Status: Abnormal   Collection Time: 02/06/15  7:30 PM  Result Value Ref Range   Troponin I 0.31 (H) <0.031 ng/mL    Comment:        PERSISTENTLY INCREASED TROPONIN VALUES IN THE RANGE OF 0.04-0.49 ng/mL CAN BE SEEN IN:       -UNSTABLE ANGINA       -CONGESTIVE HEART FAILURE       -MYOCARDITIS       -CHEST TRAUMA       -ARRYHTHMIAS       -LATE PRESENTING MYOCARDIAL INFARCTION       -COPD   CLINICAL FOLLOW-UP RECOMMENDED.   Vitamin B12     Status: None   Collection Time: 02/06/15  7:30 PM  Result Value Ref Range   Vitamin B-12 777 180 - 914 pg/mL    Comment: (NOTE) This assay is not validated for testing neonatal or myeloproliferative syndrome specimens for Vitamin B12 levels.   Folate     Status: None   Collection Time: 02/06/15  7:30 PM  Result Value Ref Range   Folate 18.1 >5.9 ng/mL  Iron and TIBC     Status: None   Collection Time: 02/06/15  7:30 PM  Result Value Ref Range   Iron 66 45  - 182 ug/dL   TIBC 251 250 - 450 ug/dL   Saturation Ratios 26 17.9 - 39.5 %   UIBC 185 ug/dL  Ferritin     Status: Abnormal   Collection Time: 02/06/15  7:30 PM  Result Value Ref Range   Ferritin 352 (H) 24 - 336 ng/mL  Reticulocytes     Status: Abnormal   Collection Time: 02/06/15  7:30 PM  Result Value Ref Range   Retic Ct Pct 2.9 0.4 - 3.1 %   RBC. 3.69 (L) 4.22 - 5.81 MIL/uL   Retic Count, Manual 107.0 19.0 - 186.0 K/uL  Lactate dehydrogenase     Status: Abnormal   Collection Time: 02/06/15  7:30 PM  Result Value Ref Range   LDH 383 (H) 98 - 192 U/L  Haptoglobin     Status: Abnormal   Collection Time: 02/06/15  7:30 PM  Result Value Ref Range   Haptoglobin 228 (H) 34 - 200 mg/dL    Comment: (NOTE) Performed At: [facility] [place], [place] [id number] Lindon Romp MD [id number]   Troponin I (q 6hr x 3)     Status: Abnormal   Collection Time: 02/07/15  1:18 AM  Result Value Ref Range   Troponin I 0.27 (H) <0.031 ng/mL    Comment:        PERSISTENTLY INCREASED TROPONIN VALUES IN THE RANGE OF 0.04-0.49 ng/mL CAN BE SEEN IN:       -UNSTABLE ANGINA       -CONGESTIVE HEART FAILURE       -MYOCARDITIS       -CHEST TRAUMA       -ARRYHTHMIAS       -  LATE PRESENTING MYOCARDIAL INFARCTION       -COPD   CLINICAL FOLLOW-UP RECOMMENDED.   Comprehensive metabolic panel     Status: Abnormal   Collection Time: 02/07/15  7:57 AM  Result Value Ref Range   Sodium 139 135 - 145 mmol/L   Potassium 4.2 3.5 - 5.1 mmol/L   Chloride 98 (L) 101 - 111 mmol/L   CO2 30 22 - 32 mmol/L   Glucose, Bld 84 65 - 99 mg/dL   BUN 60 (H) 6 - 20 mg/dL   Creatinine, Ser 2.10 (H) 0.61 - 1.24 mg/dL   Calcium 8.8 (L) 8.9 - 10.3 mg/dL   Total Protein 5.6 (L) 6.5 - 8.1 g/dL   Albumin 2.6 (L) 3.5 - 5.0 g/dL   AST 18 15 - 41 U/L   ALT 13 (L) 17 - 63 U/L   Alkaline Phosphatase 56 38 - 126 U/L   Total Bilirubin 1.0 0.3 - 1.2 mg/dL   GFR calc non Af Amer 31 (L) >60 mL/min    GFR calc Af Amer 36 (L) >60 mL/min    Comment: (NOTE) The eGFR has been calculated using the CKD EPI equation. This calculation has not been validated in all clinical situations. eGFR's persistently <60 mL/min signify possible Chronic Kidney Disease.    Anion gap 11 5 - 15  CBC with Differential/Platelet     Status: Abnormal   Collection Time: 02/07/15  7:57 AM  Result Value Ref Range   WBC 9.3 4.0 - 10.5 K/uL   RBC 3.51 (L) 4.22 - 5.81 MIL/uL   Hemoglobin 10.6 (L) 13.0 - 17.0 g/dL   HCT 32.0 (L) 39.0 - 52.0 %   MCV 91.2 78.0 - 100.0 fL   MCH 30.2 26.0 - 34.0 pg   MCHC 33.1 30.0 - 36.0 g/dL   RDW 14.4 11.5 - 15.5 %   Platelets 158 150 - 400 K/uL   Neutrophils Relative % 83 %   Neutro Abs 7.7 1.7 - 7.7 K/uL   Lymphocytes Relative 9 %   Lymphs Abs 0.8 0.7 - 4.0 K/uL   Monocytes Relative 7 %   Monocytes Absolute 0.6 0.1 - 1.0 K/uL   Eosinophils Relative 2 %   Eosinophils Absolute 0.1 0.0 - 0.7 K/uL   Basophils Relative 0 %   Basophils Absolute 0.0 0.0 - 0.1 K/uL  Magnesium     Status: Abnormal   Collection Time: 02/07/15  7:57 AM  Result Value Ref Range   Magnesium 2.5 (H) 1.7 - 2.4 mg/dL  Troponin I (q 6hr x 3)     Status: Abnormal   Collection Time: 02/07/15  7:57 AM  Result Value Ref Range   Troponin I 0.28 (H) <0.031 ng/mL    Comment:        PERSISTENTLY INCREASED TROPONIN VALUES IN THE RANGE OF 0.04-0.49 ng/mL CAN BE SEEN IN:       -UNSTABLE ANGINA       -CONGESTIVE HEART FAILURE       -MYOCARDITIS       -CHEST TRAUMA       -ARRYHTHMIAS       -LATE PRESENTING MYOCARDIAL INFARCTION       -COPD   CLINICAL FOLLOW-UP RECOMMENDED.    No results found.  Review Of Systems Pt denies any fevers, chills, nausea, vomiting, diarrhea, constipation, abdominal pain, orthopnea, cough, wheezing, palpitations, headache, vision changes, lightheadedness, dizziness, diarrhea, constipation, melena, rectal bleeding. Review of systems are otherwise negative  Blood pressure 143/72,  pulse  65, temperature 98.1 F (36.7 C), temperature source Oral, resp. rate 18, height _0  (1.727 m), weight 44.997 kg (99 lb 3.2 oz), SpO2 99 %. General: older black male. Awake and alert and oriented x3. No acute cardiopulmonary distress.  Eyes: Brown eyes, Pupils equal, round, reactive to light. Extraocular muscles are intact. Sclerae anicteric and noninjected.  ENT: Moist mucosal membranes. No mucosal lesions. Neck: Neck supple without lymphadenopathy. No carotid bruits. No masses palpated.  Cardiovascular: Regular rate with normal S1-S2 sounds. II/VI systolic murmur, no rubs, gallops auscultated. No JVD.  Respiratory: prolonged expiration phase. Mild wheezes noted earlier are clearing  Abdomen: Soft, nontender, nondistended. Active bowel sounds. No masses or hepatosplenomegaly  Skin: Dry, warm to touch. 2+ dorsalis pedis and radial pulses. Musculoskeletal: No calf or leg pain. All major joints not erythematous, nontender.  Psychiatric: Intact judgment and insight.  Neurologic: No focal neurological deficits. Cranial nerves II through XII are grossly intact.  Assessment/Plan Shortness of breath Acute diastolic heart failure COPD Moderate pulmonary hypertension Abnormal cardiac enzymes Protein calorie malnutrition Acute kidney injury Anxiety Schizophrenia Tobacco use disorder  Agree with chronic oxygen use. Nuclear stress test in AM. Dr. Charolette Forward covering for weekend.   Birdie Riddle, MD  02/07/2015, 1:26 PM

## 2015-02-10 NOTE — Progress Notes (Signed)
Patient is set up with [facility] for [facility], home 02 ordered, rolling walker and 3:1 to be delivered to room today; Alexis Goodell [phone]

## 2015-02-10 NOTE — Progress Notes (Signed)
SATURATION QUALIFICATIONS: (This note is used to comply with regulatory documentation for home oxygen)  Patient Saturations on Room Air at Rest = 95%  Patient Saturations on Room Air while Ambulating = 86%  Patient Saturations on 3 Liters of oxygen while Ambulating = 95%  Please briefly explain why patient needs home oxygen: 

## 2015-02-11 ENCOUNTER — Encounter (HOSPITAL_COMMUNITY): Payer: Self-pay | Admitting: Cardiovascular Disease

## 2015-02-11 LAB — CBC
HCT: 31.3 % — ABNORMAL LOW (ref 39.0–52.0)
Hemoglobin: 10.2 g/dL — ABNORMAL LOW (ref 13.0–17.0)
MCH: 30 pg (ref 26.0–34.0)
MCHC: 32.6 g/dL (ref 30.0–36.0)
MCV: 92.1 fL (ref 78.0–100.0)
PLATELETS: 307 10*3/uL (ref 150–400)
RBC: 3.4 MIL/uL — AB (ref 4.22–5.81)
RDW: 14.5 % (ref 11.5–15.5)
WBC: 13.2 10*3/uL — ABNORMAL HIGH (ref 4.0–10.5)

## 2015-02-11 LAB — BASIC METABOLIC PANEL
Anion gap: 7 (ref 5–15)
BUN: 53 mg/dL — AB (ref 6–20)
CALCIUM: 9 mg/dL (ref 8.9–10.3)
CO2: 31 mmol/L (ref 22–32)
CREATININE: 1.81 mg/dL — AB (ref 0.61–1.24)
Chloride: 99 mmol/L — ABNORMAL LOW (ref 101–111)
GFR calc non Af Amer: 37 mL/min — ABNORMAL LOW (ref >60)
GFR, EST AFRICAN AMERICAN: 43 mL/min — AB (ref >60)
Glucose, Bld: 106 mg/dL — ABNORMAL HIGH (ref 65–99)
Potassium: 4 mmol/L (ref 3.5–5.1)
SODIUM: 137 mmol/L (ref 135–145)

## 2015-02-11 MED ORDER — METOPROLOL TARTRATE 50 MG PO TABS: 50.0000 mg | ORAL_TABLET | Freq: Two times a day (BID) | ORAL | Status: DC

## 2015-02-11 MED ORDER — ASPIRIN EC 81 MG PO TBEC: 81.0000 mg | DELAYED_RELEASE_TABLET | Freq: Every day | ORAL | Status: DC

## 2015-02-11 MED ORDER — ALBUTEROL SULFATE HFA 108 (90 BASE) MCG/ACT IN AERS: 2.0000 | INHALATION_SPRAY | Freq: Four times a day (QID) | RESPIRATORY_TRACT | Status: DC | PRN

## 2015-02-11 MED ORDER — ADULT MULTIVITAMIN W/MINERALS CH: 1.0000 | ORAL_TABLET | Freq: Every day | ORAL | Status: DC

## 2015-02-11 MED ORDER — ISOSORBIDE MONONITRATE ER 30 MG PO TB24: 30.0000 mg | ORAL_TABLET | Freq: Every day | ORAL | Status: DC

## 2015-02-11 MED ORDER — PREDNISONE 10 MG PO TABS: 20.0000 mg | ORAL_TABLET | Freq: Every day | ORAL | Status: DC

## 2015-02-11 MED ORDER — PREDNISONE 20 MG PO TABS
20.0000 mg | ORAL_TABLET | Freq: Every day | ORAL | Status: DC
  Filled 2015-02-11: qty 1

## 2015-02-11 MED ORDER — NICOTINE 21 MG/24HR TD PT24: 21.0000 mg | MEDICATED_PATCH | Freq: Every day | TRANSDERMAL | Status: DC

## 2015-02-11 MED ORDER — ISOSORBIDE MONONITRATE ER 30 MG PO TB24
30.0000 mg | ORAL_TABLET | Freq: Every day | ORAL | Status: DC
  Administered 2015-02-11: 30 mg via ORAL
  Filled 2015-02-11: qty 1

## 2015-02-11 MED ORDER — FOLIC ACID 1 MG PO TABS: 1.0000 mg | ORAL_TABLET | Freq: Every day | ORAL | Status: DC

## 2015-02-11 MED ORDER — THIAMINE HCL 100 MG PO TABS: 100.0000 mg | ORAL_TABLET | Freq: Every day | ORAL | Status: DC

## 2015-02-11 NOTE — Discharge Instructions (Signed)
Chronic Obstructive Pulmonary Disease Chronic obstructive pulmonary disease (COPD) is a common lung condition in which airflow from the lungs is limited. COPD is a general term that can be used to describe many different lung problems that limit airflow, including both chronic bronchitis and emphysema. If you have COPD, your lung function will probably never return to normal, but there are measures you can take to improve lung function and make yourself feel better. CAUSES   Smoking (common).  Exposure to secondhand smoke.  Genetic problems.  Chronic inflammatory lung diseases or recurrent infections. SYMPTOMS  Shortness of breath, especially with physical activity.  Deep, persistent (chronic) cough with a large amount of thick mucus.  Wheezing.  Rapid breaths (tachypnea).  Gray or bluish discoloration (cyanosis) of the skin, especially in your fingers, toes, or lips.  Fatigue.  Weight loss.  Frequent infections or episodes when breathing symptoms become much worse (exacerbations).  Chest tightness. DIAGNOSIS Your health care provider will take a medical history and perform a physical examination to diagnose COPD. Additional tests for COPD may include:  Lung (pulmonary) function tests.  Chest X-ray.  CT scan.  Blood tests. TREATMENT  Treatment for COPD may include:  Inhaler and nebulizer medicines. These help manage the symptoms of COPD and make your breathing more comfortable.  Supplemental oxygen. Supplemental oxygen is only helpful if you have a low oxygen level in your blood.  Exercise and physical activity. These are beneficial for nearly all people with COPD.  Lung surgery or transplant.  Nutrition therapy to gain weight, if you are underweight.  Pulmonary rehabilitation. This may involve working with a team of health care providers and specialists, such as respiratory, occupational, and physical therapists. HOME CARE INSTRUCTIONS  Take all medicines  (inhaled or pills) as directed by your health care provider.  Avoid over-the-counter medicines or cough syrups that dry up your airway (such as antihistamines) and slow down the elimination of secretions unless instructed otherwise by your health care provider.  If you are a smoker, the most important thing that you can do is stop smoking. Continuing to smoke will cause further lung damage and breathing trouble. Ask your health care provider for help with quitting smoking. He or she can direct you to community resources or hospitals that provide support.  Avoid exposure to irritants such as smoke, chemicals, and fumes that aggravate your breathing.  Use oxygen therapy and pulmonary rehabilitation if directed by your health care provider. If you require home oxygen therapy, ask your health care provider whether you should purchase a pulse oximeter to measure your oxygen level at home.  Avoid contact with individuals who have a contagious illness.  Avoid extreme temperature and humidity changes.  Eat healthy foods. Eating smaller, more frequent meals and resting before meals may help you maintain your strength.  Stay active, but balance activity with periods of rest. Exercise and physical activity will help you maintain your ability to do things you want to do.  Preventing infection and hospitalization is very important when you have COPD. Make sure to receive all the vaccines your health care provider recommends, especially the pneumococcal and influenza vaccines. Ask your health care provider whether you need a pneumonia vaccine.  Learn and use relaxation techniques to manage stress.  Learn and use controlled breathing techniques as directed by your health care provider. Controlled breathing techniques include:  Pursed lip breathing. Start by breathing in (inhaling) through your nose for 1 second. Then, purse your lips as if you were  going to whistle and breathe out (exhale) through the  pursed lips for 2 seconds.  Diaphragmatic breathing. Start by putting one hand on your abdomen just above your waist. Inhale slowly through your nose. The hand on your abdomen should move out. Then purse your lips and exhale slowly. You should be able to feel the hand on your abdomen moving in as you exhale.  Learn and use controlled coughing to clear mucus from your lungs. Controlled coughing is a series of short, progressive coughs. The steps of controlled coughing are:  Lean your head slightly forward.  Breathe in deeply using diaphragmatic breathing.  Try to hold your breath for 3 seconds.  Keep your mouth slightly open while coughing twice.  Spit any mucus out into a tissue.  Rest and repeat the steps once or twice as needed. SEEK MEDICAL CARE IF:  You are coughing up more mucus than usual.  There is a change in the color or thickness of your mucus.  Your breathing is more labored than usual.  Your breathing is faster than usual. SEEK IMMEDIATE MEDICAL CARE IF:  You have shortness of breath while you are resting.  You have shortness of breath that prevents you from:  Being able to talk.  Performing your usual physical activities.  You have chest pain lasting longer than 5 minutes.  Your skin color is more cyanotic than usual.  You measure low oxygen saturations for longer than 5 minutes with a pulse oximeter. MAKE SURE YOU:  Understand these instructions.  Will watch your condition.  Will get help right away if you are not doing well or get worse.   This information is not intended to replace advice given to you by your health care provider. Make sure you discuss any questions you have with your health care provider.   Document Released: 01/27/2005 Document Revised: 05/10/2014 Document Reviewed: 12/14/2012 Elsevier Interactive Patient Education 2016 [employer]   Heart Failure Heart failure is a condition in which the heart has trouble pumping blood.  This means your heart does not pump blood efficiently for your body to work well. In some cases of heart failure, fluid may back up into your lungs or you may have swelling (edema) in your lower legs. Heart failure is usually a long-term (chronic) condition. It is important for you to take good care of yourself and follow your health care provider's treatment plan. CAUSES  Some health conditions can cause heart failure. Those health conditions include:  High blood pressure (hypertension). Hypertension causes the heart muscle to work harder than normal. When pressure in the blood vessels is high, the heart needs to pump (contract) with more force in order to circulate blood throughout the body. High blood pressure eventually causes the heart to become stiff and weak.  Coronary artery disease (CAD). CAD is the buildup of cholesterol and fat (plaque) in the arteries of the heart. The blockage in the arteries deprives the heart muscle of oxygen and blood. This can cause chest pain and may lead to a heart attack. High blood pressure can also contribute to CAD.  Heart attack (myocardial infarction). A heart attack occurs when one or more arteries in the heart become blocked. The loss of oxygen damages the muscle tissue of the heart. When this happens, part of the heart muscle dies. The injured tissue does not contract as well and weakens the heart's ability to pump blood.  Abnormal heart valves. When the heart valves do not open and close properly,  it can cause heart failure. This makes the heart muscle pump harder to keep the blood flowing.  Heart muscle disease (cardiomyopathy or myocarditis). Heart muscle disease is damage to the heart muscle from a variety of causes. These can include drug or alcohol abuse, infections, or unknown reasons. These can increase the risk of heart failure.  Lung disease. Lung disease makes the heart work harder because the lungs do not work properly. This can cause a strain on  the heart, leading it to fail.  Diabetes. Diabetes increases the risk of heart failure. High blood sugar contributes to high fat (lipid) levels in the blood. Diabetes can also cause slow damage to tiny blood vessels that carry important nutrients to the heart muscle. When the heart does not get enough oxygen and food, it can cause the heart to become weak and stiff. This leads to a heart that does not contract efficiently.  Other conditions can contribute to heart failure. These include abnormal heart rhythms, thyroid problems, and low blood counts (anemia). Certain unhealthy behaviors can increase the risk of heart failure, including:  Being overweight.  Smoking or chewing tobacco.  Eating foods high in fat and cholesterol.  Abusing illicit drugs or alcohol.  Lacking physical activity. SYMPTOMS  Heart failure symptoms may vary and can be hard to detect. Symptoms may include:  Shortness of breath with activity, such as climbing stairs.  Persistent cough.  Swelling of the feet, ankles, legs, or abdomen.  Unexplained weight gain.  Difficulty breathing when lying flat (orthopnea).  Waking from sleep because of the need to sit up and get more air.  Rapid heartbeat.  Fatigue and loss of energy.  Feeling light-headed, dizzy, or close to fainting.  Loss of appetite.  Nausea.  Increased urination during the night (nocturia). DIAGNOSIS  A diagnosis of heart failure is based on your history, symptoms, physical examination, and diagnostic tests. Diagnostic tests for heart failure may include:  Echocardiography.  Electrocardiography.  Chest X-ray.  Blood tests.  Exercise stress test.  Cardiac angiography.  Radionuclide scans. TREATMENT  Treatment is aimed at managing the symptoms of heart failure. Medicines, behavioral changes, or surgical intervention may be necessary to treat heart failure.  Medicines to help treat heart failure may  include:  Angiotensin-converting enzyme (ACE) inhibitors. This type of medicine blocks the effects of a blood protein called angiotensin-converting enzyme. ACE inhibitors relax (dilate) the blood vessels and help lower blood pressure.  Angiotensin receptor blockers (ARBs). This type of medicine blocks the actions of a blood protein called angiotensin. Angiotensin receptor blockers dilate the blood vessels and help lower blood pressure.  Water pills (diuretics). Diuretics cause the kidneys to remove salt and water from the blood. The extra fluid is removed through urination. This loss of extra fluid lowers the volume of blood the heart pumps.  Beta blockers. These prevent the heart from beating too fast and improve heart muscle strength.  Digitalis. This increases the force of the heartbeat.  Healthy behavior changes include:  Obtaining and maintaining a healthy weight.  Stopping smoking or chewing tobacco.  Eating heart-healthy foods.  Limiting or avoiding alcohol.  Stopping illicit drug use.  Physical activity as directed by your health care provider.  Surgical treatment for heart failure may include:  A procedure to open blocked arteries, repair damaged heart valves, or remove damaged heart muscle tissue.  A pacemaker to improve heart muscle function and control certain abnormal heart rhythms.  An internal cardioverter defibrillator to treat certain serious abnormal  heart rhythms.  A left ventricular assist device (LVAD) to assist the pumping ability of the heart. HOME CARE INSTRUCTIONS   Take medicines only as directed by your health care provider. Medicines are important in reducing the workload of your heart, slowing the progression of heart failure, and improving your symptoms.  Do not stop taking your medicine unless directed by your health care provider.  Do not skip any dose of medicine.  Refill your prescriptions before you run out of medicine. Your medicines are  needed every day.  Engage in moderate physical activity if directed by your health care provider. Moderate physical activity can benefit some people. The elderly and people with severe heart failure should consult with a health care provider for physical activity recommendations.  Eat heart-healthy foods. Food choices should be free of trans fat and low in saturated fat, cholesterol, and salt (sodium). Healthy choices include fresh or frozen fruits and vegetables, fish, lean meats, legumes, fat-free or low-fat dairy products, and whole grain or high fiber foods. Talk to a dietitian to learn more about heart-healthy foods.  Limit sodium if directed by your health care provider. Sodium restriction may reduce symptoms of heart failure in some people. Talk to a dietitian to learn more about heart-healthy seasonings.  Use healthy cooking methods. Healthy cooking methods include roasting, grilling, broiling, baking, poaching, steaming, or stir-frying. Talk to a dietitian to learn more about healthy cooking methods.  Limit fluids if directed by your health care provider. Fluid restriction may reduce symptoms of heart failure in some people.  Weigh yourself every day. Daily weights are important in the early recognition of excess fluid. You should weigh yourself every morning after you urinate and before you eat breakfast. Wear the same amount of clothing each time you weigh yourself. Record your daily weight. Provide your health care provider with your weight record.  Monitor and record your blood pressure if directed by your health care provider.  Check your pulse if directed by your health care provider.  Lose weight if directed by your health care provider. Weight loss may reduce symptoms of heart failure in some people.  Stop smoking or chewing tobacco. Nicotine makes your heart work harder by causing your blood vessels to constrict. Do not use nicotine gum or patches before talking to your health  care provider.  Keep all follow-up visits as directed by your health care provider. This is important.  Limit alcohol intake to no more than 1 drink per day for nonpregnant women and 2 drinks per day for men. One drink equals 12 ounces of beer, 5 ounces of wine, or 1 ounces of hard liquor. Drinking more than that is harmful to your heart. Tell your health care provider if you drink alcohol several times a week. Talk with your health care provider about whether alcohol is safe for you. If your heart has already been damaged by alcohol or you have severe heart failure, drinking alcohol should be stopped completely.  Stop illicit drug use.  Stay up-to-date with immunizations. It is especially important to prevent respiratory infections through current pneumococcal and influenza immunizations.  Manage other health conditions such as hypertension, diabetes, thyroid disease, or abnormal heart rhythms as directed by your health care provider.  Learn to manage stress.  Plan rest periods when fatigued.  Learn strategies to manage high temperatures. If the weather is extremely hot:  Avoid vigorous physical activity.  Use air conditioning or fans or seek a cooler location.  Avoid caffeine and  alcohol.  Wear loose-fitting, lightweight, and light-colored clothing.  Learn strategies to manage cold temperatures. If the weather is extremely cold:  Avoid vigorous physical activity.  Layer clothes.  Wear mittens or gloves, a hat, and a scarf when going outside.  Avoid alcohol.  Obtain ongoing education and support as needed.  Participate in or seek rehabilitation as needed to maintain or improve independence and quality of life. SEEK MEDICAL CARE IF:   You have a rapid weight gain.  You have increasing shortness of breath that is unusual for you.  You are unable to participate in your usual physical activities.  You tire easily.  You cough more than normal, especially with physical  activity.  You have any or more swelling in areas such as your hands, feet, ankles, or abdomen.  You are unable to sleep because it is hard to breathe.  You feel like your heart is beating fast (palpitations).  You become dizzy or light-headed upon standing up. SEEK IMMEDIATE MEDICAL CARE IF:   You have difficulty breathing.  There is a change in mental status such as decreased alertness or difficulty with concentration.  You have a pain or discomfort in your chest.  You have an episode of fainting (syncope). MAKE SURE YOU:   Understand these instructions.  Will watch your condition.  Will get help right away if you are not doing well or get worse.   This information is not intended to replace advice given to you by your health care provider. Make sure you discuss any questions you have with your health care provider.   Document Released: 04/19/2005 Document Revised: 09/03/2014 Document Reviewed: 05/19/2012 Elsevier Interactive Patient Education 2016 [employer].   Respiratory failure is when your lungs are not working well and your breathing (respiratory) system fails. When respiratory failure occurs, it is difficult for your lungs to get enough oxygen, get rid of carbon dioxide, or both. Respiratory failure can be life threatening.  Respiratory failure can be acute or chronic. Acute respiratory failure is sudden, severe, and requires emergency medical treatment. Chronic respiratory failure is less severe, happens over time, and requires ongoing treatment.  WHAT ARE THE CAUSES OF ACUTE RESPIRATORY FAILURE?  Any problem affecting the heart or lungs can cause acute respiratory failure. Some of these causes include the following:  Chronic bronchitis and emphysema (COPD).   Blood clot going to a lung (pulmonary embolism).   Having water in the lungs caused by heart failure, lung injury, or infection (pulmonary edema).   Collapsed lung (pneumothorax).   Pneumonia.    Pulmonary fibrosis.   Obesity.   Asthma.   Heart failure.   Any type of trauma to the chest that can make breathing difficult.   Nerve or muscle diseases making chest movements difficult. HOW WILL MY ACUTE RESPIRATORY FAILURE BE TREATED?  Treatment of acute respiratory failure depends on the cause of the respiratory failure. Usually, you will stay in the intensive care unit so your breathing can be watched closely. Treatment can include the following:  Oxygen. Oxygen can be delivered through the following:  Nasal cannula. This is small tubing that goes in your nose to give you oxygen.  Face mask. A face mask covers your nose and mouth to give you oxygen.  Medicine. Different medicines can be given to help with breathing. These can include:  Nebulizers. Nebulizers deliver medicines to open the air passages (bronchodilators). These medicines help to open or relax the airways in the lungs so you can breathe  better. They can also help loosen mucus from your lungs.  Diuretics. Diuretic medicines can help you breathe better by getting rid of extra water in your body.  Steroids. Steroid medicines can help decrease swelling (inflammation) in your lungs.  Antibiotics.  Chest tube. If you have a collapsed lung (pneumothorax), a chest tube is placed to help reinflate the lung.  Noninvasive positive pressure ventilation (NPPV). This is a tight-fitting mask that goes over your nose and mouth. The mask has tubing that is attached to a machine. The machine blows air into the tubing, which helps to keep the tiny air sacs (alveoli) in your lungs open. This machine allows you to breathe on your own.  Ventilator. A ventilator is a breathing machine. When on a ventilator, a breathing tube is put into the lungs. A ventilator is used when you can no longer breathe well enough on your own. You may have low oxygen levels or high carbon dioxide (CO2) levels in your blood. When you are on a  ventilator, sedation and pain medicines are given to make you sleep so your lungs can heal. SEEK IMMEDIATE MEDICAL CARE IF:  You have shortness of breath (dyspnea) with or without activity.  You have rapid breathing (tachypnea).  You are wheezing.  You are unable to say more than a few words without having to catch your breath.  You find it very difficult to function normally.  You have a fast heart rate.  You have a bluish color to your finger or toe nail beds.  You have confusion or drowsiness or both.   This information is not intended to replace advice given to you by your health care provider. Make sure you discuss any questions you have with your health care provider.   Document Released: 04/24/2013 Document Revised: 01/08/2015 Document Reviewed: 04/24/2013 Elsevier Interactive Patient Education [employer]

## 2015-02-11 NOTE — Progress Notes (Signed)
Orders received for pt discharge.  Discharge summary printed and reviewed with pt.  Explained medication regimen, and pt had no further questions at this time.  IV removed and site remains clean, dry, intact.  Telemetry removed.  Pt in stable condition and awaiting transport. 

## 2015-02-11 NOTE — Progress Notes (Signed)
Ref: FANTA,TESFAYE, MD   Subjective:  Feeling better. Awaiting discharge home. No right groin hematoma.  Objective:  Vital Signs in the last 24 hours: Temp:  [97.7 F (36.5 C)-98.4 F (36.9 C)] 97.7 F (36.5 C) (10/11 1116) Pulse Rate:  [60-74] 74 (10/11 1116) Cardiac Rhythm:  [-] Normal sinus rhythm (10/11 1146) Resp:  [18-19] 18 (10/11 1116) BP: (114-155)/(50-68) 120/57 mmHg (10/11 1116) SpO2:  [94 %-100 %] 100 % (10/11 1116) Weight:  [50.077 kg (110 lb 6.4 oz)] 50.077 kg (110 lb 6.4 oz) (10/11 0528)  Physical Exam: BP Readings from Last 1 Encounters:  02/11/15 120/57    Wt Readings from Last 1 Encounters:  02/11/15 50.077 kg (110 lb 6.4 oz)    Weight change: 0.209 kg (7.4 oz)  HEENT: [place]/AT, Eyes-Brown, PERL, EOMI, Conjunctiva-Pink, Sclera-Non-icteric Neck: No JVD, No bruit, Trachea midline. Lungs:  Clear, Bilateral. Cardiac:  Regular rhythm, normal S1 and S2, no S3. II/VI systolic murmur. Abdomen:  Soft, non-tender. Extremities:  No edema present. No cyanosis. No clubbing. CNS: AxOx3, Cranial nerves grossly intact, moves all 4 extremities. Right handed. Skin: Warm and dry.   Intake/Output from previous day: 10/10 0701 - 10/11 0700 In: 1302.5 [P.O.:1200; I.V.:102.5] Out: 350 [Urine:350]    Lab Results: BMET    Component Value Date/Time   NA 137 02/11/2015 0530   NA 137 02/10/2015 0402   NA 140 02/09/2015 0637   K 4.0 02/11/2015 0530   K 4.0 02/10/2015 0402   K 4.3 02/09/2015 0637   CL 99* 02/11/2015 0530   CL 101 02/10/2015 0402   CL 100* 02/09/2015 0637   CO2 31 02/11/2015 0530   CO2 27 02/10/2015 0402   CO2 28 02/09/2015 0637   GLUCOSE 106* 02/11/2015 0530   GLUCOSE 139* 02/10/2015 0402   GLUCOSE 131* 02/09/2015 0637   BUN 53* 02/11/2015 0530   BUN 59* 02/10/2015 0402   BUN 52* 02/09/2015 0637   CREATININE 1.81* 02/11/2015 0530   CREATININE 1.87* 02/10/2015 0402   CREATININE 1.83* 02/09/2015 0637   CALCIUM 9.0 02/11/2015 0530   CALCIUM 8.8*  02/10/2015 0402   CALCIUM 9.0 02/09/2015 0637   GFRNONAA 37* 02/11/2015 0530   GFRNONAA 35* 02/10/2015 0402   GFRNONAA 36* 02/09/2015 0637   GFRAA 43* 02/11/2015 0530   GFRAA 41* 02/10/2015 0402   GFRAA 42* 02/09/2015 0637   CBC    Component Value Date/Time   WBC 13.2* 02/11/2015 0530   RBC 3.40* 02/11/2015 0530   RBC 3.69* 02/06/2015 1930   HGB 10.2* 02/11/2015 0530   HCT 31.3* 02/11/2015 0530   PLT 307 02/11/2015 0530   MCV 92.1 02/11/2015 0530   MCH 30.0 02/11/2015 0530   MCHC 32.6 02/11/2015 0530   RDW 14.5 02/11/2015 0530   LYMPHSABS 0.4* 02/09/2015 0637   MONOABS 0.1 02/09/2015 0637   EOSABS 0.0 02/09/2015 0637   BASOSABS 0.0 02/09/2015 0637   HEPATIC Function Panel  Recent Labs  02/07/15 0757 02/08/15 0413 02/09/15 0637  PROT 5.6* 5.2* 5.4*   HEMOGLOBIN A1C No components found for: HGA1C,  MPG CARDIAC ENZYMES Lab Results  Component Value Date   TROPONINI 0.28* 02/07/2015   TROPONINI 0.27* 02/07/2015   TROPONINI 0.31* 02/06/2015   BNP No results for input(s): PROBNP in the last 8760 hours. TSH No results for input(s): TSH in the last 8760 hours. CHOLESTEROL No results for input(s): CHOL in the last 8760 hours.  Scheduled Meds: . amantadine  100 mg Oral TID  . antiseptic oral  rinse  7 mL Mouth Rinse BID  . atorvastatin  10 mg Oral q1800  . feeding supplement  1 Container Oral Q24H  . feeding supplement (ENSURE ENLIVE)  237 mL Oral TID BM  . folic acid  1 mg Oral Daily  . ipratropium-albuterol  3 mL Nebulization TID  . isosorbide mononitrate  30 mg Oral Daily  . metoprolol tartrate  50 mg Oral BID  . multivitamin with minerals  1 tablet Oral Daily  . nicotine  21 mg Transdermal Daily  . pantoprazole  40 mg Oral Daily  . [START ON 02/12/2015] predniSONE  20 mg Oral Q breakfast  . risperiDONE  0.5 mg Oral QHS  . thiamine  100 mg Oral Daily   Continuous Infusions:  PRN Meds:.sodium chloride, acetaminophen, albuterol, ondansetron (ZOFRAN) IV,  sodium chloride  Assessment/Plan: Abnormal cardiac enzymes probably demand ischemia Acute diastolic heart failure-stable COPD Moderate pulmonary hypertension Abnormal cardiac enzymes Protein calorie malnutrition Acute kidney injury-improving Anxiety Schizophrenia Tobacco use disorder   Add aspirin 81 mg. one daily. F/U 1 week.   LOS: 10 days    Orpah Cobb  MD  02/11/2015, 1:29 PM

## 2015-02-11 NOTE — Discharge Summary (Signed)
Physician Discharge Summary  [place] Monds ZOX:[id number] DOB: 1945-06-19 DOA: 02/01/2015  PCP: Avon Gully, MD  Admit date: 02/01/2015 Discharge date: 02/11/2015  Time spent: Greater than 30 minutes  Recommendations for Outpatient Follow-up:  1. Dr. Avon Gully, PCP on 02/21/2015 at 10 AM. To be seen with repeat labs (CBC & BMP). 2. Recommend repeating chest x-ray in 3-4 weeks. 3. Dr. Orpah Cobb, Cardiology in 1 week. 4. Home health PT, OT, RN, 3 n 1, rolling walker and home oxygen.  Discharge Diagnoses:  Active Problems:   CAP (community acquired pneumonia)   Acute respiratory failure with hypoxia (HCC)   Elevated troponin   Acute kidney failure (HCC)   HCAP (healthcare-associated pneumonia)   Pulmonary edema   Acute respiratory failure with hypercapnia (HCC)   Acute pulmonary edema (HCC)   COPD exacerbation (HCC)   Tobacco abuse   Chronic diastolic CHF (congestive heart failure) (HCC)   Pulmonary hypertension (HCC)   Acute kidney injury (HCC)   Essential hypertension   Schizophrenia (HCC)   Anxiety state   Acute coronary syndrome [facility])   Discharge Condition: Improved & Stable  Diet recommendation: Heart healthy diet.  Filed Weights   02/09/15 0503 02/10/15 0534 02/11/15 0528  Weight: 48 kg (105 lb 13.1 oz) 49.868 kg (109 lb 15 oz) 50.077 kg (110 lb 6.4 oz)    History of present illness:  69 y/o African-American male patient with history of COPD (not on home oxygen PTA), ongoing tobacco abuse, chronic alcohol abuse, schizophrenia, anxiety, first presented to [facility] on 02/01/15 with progressively worsening dyspnea over 4-6 weeks PTA which acutely got worse 48 hours PTA. He was hypoxic at 86%. He was admitted for acute respiratory failure with hypoxia, CAP, AKI (baseline creatinine not known) and elevated troponin. He deteriorated on 02/03/15 with worsening agitation, dyspnea and hypoxia. He was intubated and transferred to ICU under CCM care. After stabilization, he was  transferred to [facility] service on 02/05/15. CCM have signed off.   Hospital Course:   Acute on chronic Respiratory Failure with hypoxia and hypercarbia/Pulmonary Edema - Acute respiratory failure felt to be mainly from acute pulmonary edema and COPD exacerbation and less likely related to infectious etiology i.e. CAP - Required intubation and mechanical ventilation but self extubated on same day. Now extubated and stable. - Treated underlying cause. - As per prior evaluation, DME setup for home O2, patient will need 2 L O2 at rest, and 3 L O2 during exertion - Home health services arranged by case management.  COPD Exacerbation  - Tobacco cessation counseled. - Exacerbation resolved. - changed IV Solu-Medrol to oral steroid taper. - Completed antibiotic course.  Tobacco Abuse  - Cessation counseled. Nicotine patch.  Acute diastolic CHF/pulmonary hypertension  - felt to have pulmonary edema early on in admission. Improved and stable. Currently off diuretics. - metoprolol 50 mg BID - Isosorbide mononitrate 10 mg BID  Elevated troponin, continue elevation - Troponin elevation may be due to demand ischemia from acute respiratory failure, COPD exacerbation, acute kidney injury and pneumonia. However patient has cardiac risk factors with heart score of 4. Thereby cardiology was consulted - Stress test results appreciated: Abnormal.  - Status post cardiac cath 10/10 with findings as below and recommend medical treatment. Discussed with Dr. Algie Coffer who has cleared him for discharge and recommends enteric coated aspirin 81 MG daily and will follow-up in office in 1 week.  Essential Hypertension  - Continue metoprolol and Imdur. Reasonable control.  AKI - unclear baseline secondary to no  information. More likeley AoCKD - Slowly improving - Avoid nephrotoxic medication;  - renal ultrasound : No hydronephrosis - Unclear when Foley catheter was placed-did not have PTA. DC Foley catheter 10/9>  voiding well per pt and RN - Patient did receive contrast during cardiac cath/limited amount on 10/10. Creatinine stable in the 1.8 range. Follow BMP as outpatient.   Hypokalemia - Replaced.  Suspected Protein Calorie Malnutrition  -Ensure TID  Anemia, chronic disease versus iron deficiency - Stable. Anemia panel: Iron 66, TIBC 251, saturation ratio 26, ferritin 352, folate 18.1 and B12: 777.  Hx DVT -Subcutaneous heparin for prophylaxis  Schizophrenia and anxiety -Amantadine 100 mg TID - Seems to be stable. - Patient states that he is due to have his Invega shot on 10/13 and a ride will pick him up to take him to [facility]   ? ETOH Abuse  -ContinueThiamine / Folate / MVI - No overt withdrawal.  Hyperglycemia - Likely related to steroids. Reasonable inpatient control.    Consultants: CCM-signed off  Cardiology-Dr. Lorayne Bender  Procedures/Significant Events:  Foley catheter-DC 10/9  Intubation and mechanical ventilation on 10/3-self extubated same day  2-D echo 02/04/15: Study Conclusions  - Left ventricle: The cavity size was normal. Wall thickness was normal. Systolic function was normal. The estimated ejection fraction was in the range of 50% to 55%. Wall motion was normal; there were no regional wall motion abnormalities. Features are consistent with a pseudonormal left ventricular filling pattern, with concomitant abnormal relaxation and increased filling pressure (grade 2 diastolic dysfunction). - Mitral valve: There was mild regurgitation. - Pulmonary arteries: Systolic pressure was moderately increased. PA peak pressure: 66 mm Hg (S).   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 The lesion was not previously treated.  Medical treatment.  Antibiotics: Rocephin / Azithro, start date 10/1 >> 10/3 Vanco, start date 10/3>>>10/4 Zosyn, start date 10/3>>>10/4   Discharge  Exam:  Complaints: Denies complaints.  Filed Vitals:   02/10/15 2114 02/11/15 0049 02/11/15 0528 02/11/15 1116  BP:  114/56 137/68 120/57  Pulse: 68 60 63 74  Temp:  98.4 F (36.9 C) 98.2 F (36.8 C) 97.7 F (36.5 C)  TempSrc:  Oral Oral Oral  Resp:  19 18 18   Height:      Weight:   50.077 kg (110 lb 6.4 oz)   SpO2:  96% 94% 100%    General exam: Pleasant middle-aged male sitting up comfortably in bed. Respiratory system: Clear. No increased work of breathing. Cardiovascular system: S1 & S2 heard, RRR. No JVD, murmurs, gallops, clicks or pedal edema. Telemetry: Sinus bradycardia in the mid 50s-sinus rhythm. Gastrointestinal system: Abdomen is nondistended, soft and nontender. Normal bowel sounds heard.  Central nervous system: Alert and oriented. No focal neurological deficits. Extremities: Symmetric 5 x 5 power.  Discharge Instructions      Discharge Instructions    Call MD for:  difficulty breathing, headache or visual disturbances    Complete by:  As directed      Call MD for:  extreme fatigue    Complete by:  As directed      Call MD for:  persistant dizziness or light-headedness    Complete by:  As directed      Call MD for:  persistant nausea and vomiting    Complete by:  As directed      Call MD for:  severe uncontrolled pain    Complete by:  As directed  Call MD for:  temperature >100.4    Complete by:  As directed      Diet - low sodium heart healthy    Complete by:  As directed      Discharge instructions    Complete by:  As directed   home oxygen: 2 L/m via nasal cannula at rest and 3 L/m via nasal cannula with activity.     Increase activity slowly    Complete by:  As directed             Medication List    STOP taking these medications        BC HEADACHE POWDER PO      TAKE these medications        albuterol 108 (90 BASE) MCG/ACT inhaler  Commonly known as:  PROVENTIL HFA;VENTOLIN HFA  Inhale 2 puffs into the lungs every 6 (six)  hours as needed for wheezing or shortness of breath.     amantadine 100 MG capsule  Commonly known as:  SYMMETREL  Take 100 mg by mouth 3 (three) times daily.     aspirin EC 81 MG tablet  Take 1 tablet (81 mg total) by mouth daily.     atorvastatin 10 MG tablet  Commonly known as:  LIPITOR  Take 10 mg by mouth daily.     folic acid 1 MG tablet  Commonly known as:  FOLVITE  Take 1 tablet (1 mg total) by mouth daily.     INVEGA SUSTENNA 156 MG/ML Susp injection  Generic drug:  paliperidone  Inject 156 mg as directed every 6 (six) weeks.     isosorbide mononitrate 30 MG 24 hr tablet  Commonly known as:  IMDUR  Take 1 tablet (30 mg total) by mouth daily.     metoprolol 50 MG tablet  Commonly known as:  LOPRESSOR  Take 1 tablet (50 mg total) by mouth 2 (two) times daily.     multivitamin with minerals Tabs tablet  Take 1 tablet by mouth daily.     nicotine 21 mg/24hr patch  Commonly known as:  NICODERM CQ - dosed in mg/24 hours  Place 1 patch (21 mg total) onto the skin daily.     predniSONE 10 MG tablet  Commonly known as:  DELTASONE  Take 2 tablets (20 mg total) by mouth daily with breakfast. Taper by 10 mg every 2 days and eventually discontinue.     thiamine 100 MG tablet  Take 1 tablet (100 mg total) by mouth daily.       Follow-up Information    Follow up with [facility].   Specialty:  Home Health Services   Why:  They will do your home health care at your home   Contact information:   [address] [place] [address] [phone]       Follow up with [facility], MD On 02/21/2015.   Specialty:  Internal Medicine   Why:  F/U @ 10am confirmed Ida. To be seen with repeat labs (CBC & BMP)   Contact information:   [address] [place] [address] [phone]       Follow up with [facility], MD. Schedule an appointment as soon as possible for a visit in 1 week.   Specialty:  Cardiology   Contact information:   [address]  [place] [address] [phone]        The results of significant diagnostics from this hospitalization (including imaging, microbiology, ancillary and laboratory)  are listed below for reference.    Significant Diagnostic Studies: Dg Chest 2 View  02/01/2015   CLINICAL DATA:  Cough and short of breath  EXAM: CHEST  2 VIEW  COMPARISON:  01/18/2015  FINDINGS: Interval development of prominent reticulonodular densities throughout both lungs. No effusion. No lobar infiltrate. Mild hyperinflation of the lungs  Heart size is normal.  Vascularity normal.  IMPRESSION: Interval development of reticular nodular markings in the lungs right greater than left. This may be due to acute infection or inhalational lung disease. Consider virus or mycoplasma pneumonia.   Electronically Signed   By: Marlan Palau M.D.   On: 02/01/2015 16:07   Dg Chest 2 View  01/18/2015   CLINICAL DATA:  Chest pain and cough for 1 year.  EXAM: CHEST  2 VIEW  COMPARISON:  None.  FINDINGS: The cardiac silhouette, mediastinal and hilar contours are normal. The lungs are clear. No pleural effusion. The bony thorax is intact.  IMPRESSION: No acute cardiopulmonary findings.   Electronically Signed   By: Rudie Meyer M.D.   On: 01/18/2015 10:13   US Renal  02/09/2015   CLINICAL DATA:  Patient with acute renal failure.  EXAM: RENAL / URINARY TRACT ULTRASOUND COMPLETE  COMPARISON:  None.  FINDINGS: Right Kidney:  Length: 9.7 cm. Mildly increased renal cortical echogenicity. No hydronephrosis. Normal renal cortical thickness.  Left Kidney:  Length: 9.3 cm. Mildly increased renal cortical echogenicity. No hydronephrosis. Normal renal cortical thickness. There is a sub cm hypoechoic lesion within left kidney too small to characterize however likely represents a small cyst.  Bladder:  Appears normal for degree of bladder distention.  IMPRESSION: No hydronephrosis.  Mildly echogenic renal cortical parenchyma bilaterally as can  be seen with chronic medical renal disease.   Electronically Signed   By: Annia Belt M.D.   On: 02/09/2015 14:28   Nm Myocar Multi W/spect W/wall Motion / Ef  02/08/2015   CLINICAL DATA:  69 year old who presented this admission with acute onset of shortness of breath to to an interstitial pneumonitis or interstitial edema. Patient also complains of chest discomfort and pain. Abnormal resting EKG demonstrating right bundle-branch block.  EXAM: MYOCARDIAL IMAGING WITH SPECT (REST AND PHARMACOLOGIC-STRESS)  GATED LEFT VENTRICULAR WALL MOTION STUDY  LEFT VENTRICULAR EJECTION FRACTION  TECHNIQUE: Standard myocardial SPECT imaging was performed after resting intravenous injection of 10 mCi Tc-23m sestamibi. Subsequently, intravenous infusion of Lexiscan was performed under the supervision of the Cardiology staff. At peak effect of the drug, 30 mCi Tc-52m sestamibi was injected intravenously and standard myocardial SPECT imaging was performed. Quantitative gated imaging was also performed to evaluate left ventricular wall motion, and estimate left ventricular ejection fraction.  COMPARISON:  None.  FINDINGS: Perfusion: Artifactually increased activity involving the inferoapical region due to subdiaphragmatic gut activity on the stress images, less prominent on the resting images. Small perfusion defect involving the apex on the stress images with slight reversibility on the resting images. Small perfusion defect involving the distal anterior wall without reversibility.  Wall Motion: Mild inferior wall hypokinesis. No left ventricular dilation.  Left Ventricular Ejection Fraction: 46%  End diastolic volume 119 ml  End systolic volume 64 ml  IMPRESSION: 1. Artifact related to subdiaphragmatic activity in the inferoapical region on the stress images and to a lesser degree the resting images.  Small reversible defect involving the apex indicating possible ischemia.  Small irreversible defect involving the distal anterior  wall.  2. Mild inferior wall hypokinesis.  3. Left  ventricular ejection fraction 46%  4. Intermediate-risk stress test findings*.  *2012 Appropriate Use Criteria for Coronary Revascularization Focused Update: J Am Coll Cardiol. 2012;59(9):857-881. http://content.dementiazones.com.aspx?articleid=[email or web address]   Electronically Signed   By: Hulan Saas M.D.   On: 02/08/2015 16:19   Dg Chest Port 1 View  02/05/2015   CLINICAL DATA:  Pulmonary edema.  Shortness of breath.  EXAM: PORTABLE CHEST 1 VIEW  COMPARISON:  02/04/2015, 02/03/2015.  FINDINGS: Mediastinum hilar structures are normal. Heart size stable. Interim slight progression of diffuse bilateral pulmonary interstitial infiltrates. No pleural effusion or pneumothorax. No acute osseous abnormality .  IMPRESSION: Interim slight progression of diffuse bilateral pulmonary interstitial infiltrates.   Electronically Signed   By: Maisie Fus  Register   On: 02/05/2015 07:10   Dg Chest Port 1 View  02/04/2015   CLINICAL DATA:  Community-acquired pneumonia, acute respiratory failure, acute renal failure.  EXAM: PORTABLE CHEST 1 VIEW  COMPARISON:  Portable chest x-ray of February 03, 2015  FINDINGS: The lungs are well-expanded. There has been interval extubation of the trachea and of the esophagus. There remain mildly increased interstitial markings diffusely with more confluent areas in the left mid lung. The heart and pulmonary vascularity are normal. There is no pleural effusion or pneumothorax. The bony thorax is unremarkable.  IMPRESSION: Improving interstitial infiltrates or edema. There remains asymmetrically increased parenchymal density in the left mid lung but it too has improved since yesterday's study.   Electronically Signed   By: David  [place] M.D.   On: 02/04/2015 07:16   Dg Chest Port 1 View  02/03/2015   CLINICAL DATA:  Respiratory failure.  EXAM: PORTABLE CHEST 1 VIEW  COMPARISON:  Chest radiograph from earlier today.  FINDINGS: Endotracheal tube  tip is just below the thoracic inlet 6.6 cm above the carina. Enteric tube terminates in the proximal stomach. Stable cardiomediastinal silhouette with normal heart size. No pneumothorax. No pleural effusion. There are severe patchy airspace opacities throughout both lungs in a vaguely parahilar distribution, not appreciably changed.  IMPRESSION: 1. Endotracheal tube tip just below the thoracic inlet 6.6 cm above the carina. Enteric tube terminates in the proximal stomach. 2. No appreciable change in severe patchy bilateral airspace opacities with the differential of diffuse alveolar hemorrhage, multifocal pneumonia or ARDS.   Electronically Signed   By: Delbert Phenix M.D.   On: 02/03/2015 13:34   Dg Chest Port 1 View  02/03/2015   CLINICAL DATA:  Acute onset of hypoxia.  Initial encounter.  EXAM: PORTABLE CHEST 1 VIEW  COMPARISON:  Chest radiograph performed 02/01/2015  FINDINGS: The lungs are well-aerated. Diffuse bilateral airspace opacification is noted. This may reflect multifocal pneumonia or possibly pulmonary edema. ARDS could have such an appearance. There is no evidence of pleural effusion or pneumothorax.  The cardiomediastinal silhouette is within normal limits. No acute osseous abnormalities are seen.  IMPRESSION: Diffuse bilateral airspace opacification noted. This may reflect multifocal pneumonia or possibly pulmonary edema. ARDS could have such an appearance.   Electronically Signed   By: Roanna Raider M.D.   On: 02/03/2015 03:33    Microbiology: Recent Results (from the past 240 hour(s))  Culture, respiratory (NON-Expectorated)     Status: None   Collection Time: 02/03/15 12:39 PM  Result Value Ref Range Status   Specimen Description TRACHEAL ASPIRATE  Final   Special Requests NONE  Final   Gram Stain   Final    RARE WBC PRESENT,BOTH PMN AND MONONUCLEAR NO SQUAMOUS EPITHELIAL CELLS SEEN NO ORGANISMS SEEN Performed  at [facility]    Culture   Final    RARE YEAST  CONSISTENT WITH CANDIDA SPECIES Performed at [facility]    Report Status 02/06/2015 FINAL  Final  MRSA PCR Screening     Status: None   Collection Time: 02/03/15  3:00 PM  Result Value Ref Range Status   MRSA by PCR NEGATIVE NEGATIVE Final    Comment:        The GeneXpert MRSA Assay (FDA approved for NASAL specimens only), is one component of a comprehensive MRSA colonization surveillance program. It is not intended to diagnose MRSA infection nor to guide or monitor treatment for MRSA infections.   Culture, respiratory (NON-Expectorated)     Status: None   Collection Time: 02/03/15  3:15 PM  Result Value Ref Range Status   Specimen Description ENDOTRACHEAL  Final   Special Requests NONE  Final   Gram Stain   Final    RARE WBC PRESENT, PREDOMINANTLY PMN RARE SQUAMOUS EPITHELIAL CELLS PRESENT NO ORGANISMS SEEN Performed at [facility]    Culture   Final    RARE CANDIDA ALBICANS Performed at [facility]    Report Status 02/06/2015 FINAL  Final  MRSA PCR Screening     Status: None   Collection Time: 02/03/15  4:45 PM  Result Value Ref Range Status   MRSA by PCR NEGATIVE NEGATIVE Final    Comment:        The GeneXpert MRSA Assay (FDA approved for NASAL specimens only), is one component of a comprehensive MRSA colonization surveillance program. It is not intended to diagnose MRSA infection nor to guide or monitor treatment for MRSA infections.      Labs: Basic Metabolic Panel:  Recent Labs Lab 02/05/15 0300  02/07/15 0757 02/08/15 0413 02/09/15 0637 02/10/15 0402 02/11/15 0530  NA 139  < > 139 139 140 137 137  K 3.7  < > 4.2 3.9 4.3 4.0 4.0  CL 100*  < > 98* 101 100* 101 99*  CO2 27  < > GLUCOSE 128*  < > 84 107* 131* 139* 106*  BUN 57*  < > 60* 59* 52* 59* 53*  CREATININE 2.32*  < > 2.10* 2.09* 1.83* 1.87* 1.81*  CALCIUM 9.4  < > 8.8* 8.9 9.0 8.8* 9.0  MG 2.4  --  2.5* 2.4 2.1  --   --   PHOS 3.4  --   --    --   --   --   --   < > = values in this interval not displayed. Liver Function Tests:  Recent Labs Lab 02/07/15 0757 02/08/15 0413 02/09/15 0637  AST ALT 13* 11* 15*  ALKPHOS 56 49 51  BILITOT 1.0 0.9 0.8  PROT 5.6* 5.2* 5.4*  ALBUMIN 2.6* 2.4* 2.5*   No results for input(s): LIPASE, AMYLASE in the last 168 hours. No results for input(s): AMMONIA in the last 168 hours. CBC:  Recent Labs Lab 02/06/15 0521 02/07/15 0757 02/08/15 0413 02/09/15 0637 02/11/15 0530  WBC 12.4* 9.3 9.9 7.6 13.2*  NEUTROABS  --  7.7 8.2* 7.1  --   HGB 10.7* 10.6* 10.0* 10.4* 10.2*  HCT 32.0* 32.0* 30.5* 31.7* 31.3*  MCV 90.1 91.2 92.4 91.6 92.1  PLT 172 158 167 191 307   Cardiac Enzymes:  Recent Labs Lab 02/06/15 1930 02/07/15 0118 02/07/15 0757  TROPONINI 0.31* 0.27* 0.28*   BNP: BNP (  last 3 results)  Recent Labs  02/01/15 1530 02/03/15 0835  BNP 222.0* 602.0*    ProBNP (last 3 results) No results for input(s): PROBNP in the last 8760 hours.  CBG:  Recent Labs Lab 02/04/15 1307 02/05/15 0009 02/05/15 0359  GLUCAP 163* 146* 138*      Signed:  Marcellus Scott, MD, FACP, FHM. [facility] Pager [phone]  If 7PM-7AM, please contact night-coverage [email or web address] Password [email or web address] 02/11/2015, 1:03 PM

## 2015-02-11 NOTE — Progress Notes (Signed)
Physical Therapy Treatment Patient Details Name: Jared Austin MRN: [id number] DOB: 1946-03-27 Today's Date: 02/11/2015    History of Present Illness 69 y/o M, smoker, with PMH of schizophrenia who was admitted to [facility] on 10/1 with SOB. Found to be hypoxic (86% on RA), concern for CAP. Decompensation with intubation 10/3 and was transferred to [facility] for further evaluation with self-extubation same day.      PT Comments    Patient in bed, eager to participate in PT today to go home. Patient was able to ambulate and navigate stairs as described below. He ambulated less distance than last time - self-limiting due to desire to go home and not be over-worked. Sats remained >90% throughout ambulation. He required VC's for energy conservation techniques due to limited awareness of current deficits. Was oriented to day and situation. 2/4 goals met this session. Patient will benefit from continued PT to improve safety with ambulation, including increased ambulatory endurance.   Follow Up Recommendations  Supervision for mobility/OOB     Equipment Recommendations  Rolling walker with 5" wheels (Would benefit for longer distances.)    Recommendations for Other Services       Precautions / Restrictions Precautions Precautions: Fall Restrictions Weight Bearing Restrictions: No    Mobility  Bed Mobility Overal bed mobility: Independent                Transfers Overall transfer level: Independent                  Ambulation/Gait Ambulation/Gait assistance: Supervision Ambulation Distance (Feet): 160 Feet Assistive device: None Gait Pattern/deviations: Step-through pattern   Gait velocity interpretation: at or above normal speed for age/gender (Requires cues for safe speed.) General Gait Details: Patient impulsive with gait, walking at a very fast pace. Required cues for safe speed as well as energy conservation. Required 2 seated rest breaks about 30 seconds each. Sats  remained >90% throughout. Patient able to decrease gait speed with min VC's   Stairs Stairs: Yes Stairs assistance: Supervision Stair Management: One rail Right;Alternating pattern;Forwards Number of Stairs: 14 General stair comments: Patient slow with stairs, says he "feels weaker than normal." Sats at 93% after flight of stairs. Alternating pattern both going up and down, more steady going downstairs.  Wheelchair Mobility    Modified Rankin (Stroke Patients Only)       Balance Overall balance assessment: Independent                                  Cognition Arousal/Alertness: Awake/alert Behavior During Therapy: WFL for tasks assessed/performed;Impulsive Overall Cognitive Status: No family/caregiver present to determine baseline cognitive functioning           Safety/Judgement: Decreased awareness of safety;Decreased awareness of deficits          Exercises      General Comments        Pertinent Vitals/Pain Pain Assessment: No/denies pain    Home Living                      Prior Function            PT Goals (current goals can now be found in the care plan section) Acute Rehab PT Goals Patient Stated Goal: Go home today PT Goal Formulation: With patient Time For Goal Achievement: 02/21/15 Potential to Achieve Goals: Good Progress towards PT goals: Progressing toward goals    Frequency  Min 3X/week    PT Plan Current plan remains appropriate    Co-evaluation             End of Session   Activity Tolerance: Patient tolerated treatment well;Patient limited by fatigue Patient left: in chair;with call bell/phone within reach     Time: 1144-1152 PT Time Calculation (min) (ACUTE ONLY): 8 min  Charges:  $Gait Training: 8-22 mins                    G CodesRoanna Epley, SPT [phone] 02/11/2015, 12:06 PM

## 2015-02-11 NOTE — Care Management Important Message (Signed)
Important Message  Patient Details  Name: Jared Austin MRN: [id number] Date of Birth: 30-Sep-1945   Medicare Important Message Given:  Yes-third notification given    Orson Aloe 02/11/2015, 11:19 AM

## 2015-06-17 ENCOUNTER — Inpatient Hospital Stay (HOSPITAL_COMMUNITY)
Admission: EM | Admit: 2015-06-17 | Discharge: 2015-06-24 | DRG: 682 | Disposition: A | Discharge status: Home Health | Payer: Medicare Other | Attending: Family Medicine | Admitting: Family Medicine

## 2015-06-17 ENCOUNTER — Encounter (HOSPITAL_COMMUNITY): Payer: Self-pay | Admitting: Emergency Medicine

## 2015-06-17 ENCOUNTER — Emergency Department (HOSPITAL_COMMUNITY): Payer: Medicare Other

## 2015-06-17 DIAGNOSIS — I248 Other forms of acute ischemic heart disease: Secondary | ICD-10-CM | POA: Diagnosis not present

## 2015-06-17 DIAGNOSIS — F17210 Nicotine dependence, cigarettes, uncomplicated: Secondary | ICD-10-CM | POA: Diagnosis present

## 2015-06-17 DIAGNOSIS — F05 Delirium due to known physiological condition: Secondary | ICD-10-CM | POA: Diagnosis not present

## 2015-06-17 DIAGNOSIS — E875 Hyperkalemia: Secondary | ICD-10-CM | POA: Diagnosis present

## 2015-06-17 DIAGNOSIS — N183 Chronic kidney disease, stage 3 (moderate): Secondary | ICD-10-CM | POA: Diagnosis present

## 2015-06-17 DIAGNOSIS — J9621 Acute and chronic respiratory failure with hypoxia: Secondary | ICD-10-CM

## 2015-06-17 DIAGNOSIS — Y92017 Garden or yard in single-family (private) house as the place of occurrence of the external cause: Secondary | ICD-10-CM

## 2015-06-17 DIAGNOSIS — F411 Generalized anxiety disorder: Secondary | ICD-10-CM | POA: Diagnosis present

## 2015-06-17 DIAGNOSIS — R55 Syncope and collapse: Secondary | ICD-10-CM | POA: Diagnosis not present

## 2015-06-17 DIAGNOSIS — Z9981 Dependence on supplemental oxygen: Secondary | ICD-10-CM

## 2015-06-17 DIAGNOSIS — R748 Abnormal levels of other serum enzymes: Secondary | ICD-10-CM

## 2015-06-17 DIAGNOSIS — I130 Hypertensive heart and chronic kidney disease with heart failure and stage 1 through stage 4 chronic kidney disease, or unspecified chronic kidney disease: Secondary | ICD-10-CM | POA: Diagnosis present

## 2015-06-17 DIAGNOSIS — S2231XA Fracture of one rib, right side, initial encounter for closed fracture: Secondary | ICD-10-CM | POA: Diagnosis present

## 2015-06-17 DIAGNOSIS — G4700 Insomnia, unspecified: Secondary | ICD-10-CM | POA: Diagnosis present

## 2015-06-17 DIAGNOSIS — D649 Anemia, unspecified: Secondary | ICD-10-CM | POA: Diagnosis present

## 2015-06-17 DIAGNOSIS — I10 Essential (primary) hypertension: Secondary | ICD-10-CM | POA: Diagnosis not present

## 2015-06-17 DIAGNOSIS — Z7982 Long term (current) use of aspirin: Secondary | ICD-10-CM | POA: Diagnosis not present

## 2015-06-17 DIAGNOSIS — G9340 Encephalopathy, unspecified: Secondary | ICD-10-CM | POA: Diagnosis present

## 2015-06-17 DIAGNOSIS — D72829 Elevated white blood cell count, unspecified: Secondary | ICD-10-CM

## 2015-06-17 DIAGNOSIS — N179 Acute kidney failure, unspecified: Secondary | ICD-10-CM | POA: Diagnosis present

## 2015-06-17 DIAGNOSIS — I5033 Acute on chronic diastolic (congestive) heart failure: Secondary | ICD-10-CM | POA: Diagnosis not present

## 2015-06-17 DIAGNOSIS — E860 Dehydration: Secondary | ICD-10-CM | POA: Diagnosis present

## 2015-06-17 DIAGNOSIS — R410 Disorientation, unspecified: Secondary | ICD-10-CM

## 2015-06-17 DIAGNOSIS — R4182 Altered mental status, unspecified: Secondary | ICD-10-CM | POA: Diagnosis present

## 2015-06-17 DIAGNOSIS — R0902 Hypoxemia: Secondary | ICD-10-CM

## 2015-06-17 DIAGNOSIS — F209 Schizophrenia, unspecified: Secondary | ICD-10-CM | POA: Diagnosis present

## 2015-06-17 DIAGNOSIS — W19XXXA Unspecified fall, initial encounter: Secondary | ICD-10-CM | POA: Diagnosis present

## 2015-06-17 DIAGNOSIS — R0602 Shortness of breath: Secondary | ICD-10-CM

## 2015-06-17 DIAGNOSIS — N19 Unspecified kidney failure: Secondary | ICD-10-CM

## 2015-06-17 HISTORY — DX: Pneumonia, unspecified organism: J18.9

## 2015-06-17 HISTORY — DX: Essential (primary) hypertension: I10

## 2015-06-17 LAB — URINALYSIS, ROUTINE W REFLEX MICROSCOPIC
BILIRUBIN URINE: NEGATIVE
GLUCOSE, UA: NEGATIVE mg/dL
HGB URINE DIPSTICK: NEGATIVE
Ketones, ur: NEGATIVE mg/dL
LEUKOCYTES UA: NEGATIVE
NITRITE: NEGATIVE
Protein, ur: NEGATIVE mg/dL
pH: 5 (ref 5.0–8.0)

## 2015-06-17 LAB — CBC
HCT: 33.1 % — ABNORMAL LOW (ref 39.0–52.0)
Hemoglobin: 11.4 g/dL — ABNORMAL LOW (ref 13.0–17.0)
MCH: 31.2 pg (ref 26.0–34.0)
MCHC: 34.4 g/dL (ref 30.0–36.0)
MCV: 90.7 fL (ref 78.0–100.0)
PLATELETS: 342 10*3/uL (ref 150–400)
RBC: 3.65 MIL/uL — AB (ref 4.22–5.81)
RDW: 12.8 % (ref 11.5–15.5)
WBC: 24.9 10*3/uL — AB (ref 4.0–10.5)

## 2015-06-17 LAB — COMPREHENSIVE METABOLIC PANEL
ALK PHOS: 54 U/L (ref 38–126)
ALT: 21 U/L (ref 17–63)
AST: 61 U/L — ABNORMAL HIGH (ref 15–41)
Albumin: 4 g/dL (ref 3.5–5.0)
Anion gap: 12 (ref 5–15)
BILIRUBIN TOTAL: 0.6 mg/dL (ref 0.3–1.2)
BUN: 40 mg/dL — ABNORMAL HIGH (ref 6–20)
CALCIUM: 9 mg/dL (ref 8.9–10.3)
CO2: 20 mmol/L — ABNORMAL LOW (ref 22–32)
CREATININE: 3.78 mg/dL — AB (ref 0.61–1.24)
Chloride: 101 mmol/L (ref 101–111)
GFR calc Af Amer: 17 mL/min — ABNORMAL LOW (ref >60)
GFR, EST NON AFRICAN AMERICAN: 15 mL/min — AB (ref >60)
Glucose, Bld: 89 mg/dL (ref 65–99)
Potassium: 5.8 mmol/L — ABNORMAL HIGH (ref 3.5–5.1)
Sodium: 133 mmol/L — ABNORMAL LOW (ref 135–145)
Total Protein: 7.1 g/dL (ref 6.5–8.1)

## 2015-06-17 LAB — RAPID URINE DRUG SCREEN, HOSP PERFORMED
Amphetamines: NOT DETECTED
BENZODIAZEPINES: NOT DETECTED
Barbiturates: NOT DETECTED
COCAINE: NOT DETECTED
Opiates: NOT DETECTED
Tetrahydrocannabinol: NOT DETECTED

## 2015-06-17 LAB — CBG MONITORING, ED
GLUCOSE-CAPILLARY: 105 mg/dL — AB (ref 65–99)
GLUCOSE-CAPILLARY: 108 mg/dL — AB (ref 65–99)

## 2015-06-17 LAB — ETHANOL

## 2015-06-17 MED ORDER — LORAZEPAM 2 MG/ML IJ SOLN
1.0000 mg | Freq: Once | INTRAMUSCULAR | Status: AC
  Administered 2015-06-17: 1 mg via INTRAVENOUS
  Filled 2015-06-17: qty 1

## 2015-06-17 NOTE — BH Assessment (Signed)
Writer informed [facility] staff Lurena Joiner) that pt disclosed knife in pocket and showed writer object that appeared to be pocket knife during TTS assessment.

## 2015-06-17 NOTE — ED Provider Notes (Signed)
CSN: [id number]     Arrival date & time 06/17/15  1759 History   First MD Initiated Contact with Patient 06/17/15 1853     Chief Complaint  Patient presents with  . Altered Mental Status     (Consider location/radiation/quality/duration/timing/severity/associated sxs/prior Treatment) HPI..... Level V caveat for schizophrenia. History obtained from brother. Brother states the patient was going outside to smoke a cigarette when he questionably passed out and was found lying on the ground. No prodromal illnesses. Brother states this is abnormal behavior. No fever, sweats, chills, cough, chest pain, dysuria.  Past Medical History  Diagnosis Date  . Anxiety   . Schizophrenia (HCC)   . Pneumonia   . Hypertension    Past Surgical History  Procedure Laterality Date  . Cardiac catheterization N/A 02/10/2015    Procedure: Left Heart Cath and Coronary Angiography;  Surgeon: Orpah Cobb, MD;  Location: [facility];  Service: Cardiovascular;  Laterality: N/A;   History reviewed. No pertinent family history. Social History  Substance Use Topics  . Smoking status: Current Every Day Smoker -- 1.50 packs/day for 62 years    Types: Cigarettes  . Smokeless tobacco: None  . Alcohol Use: No    Review of Systems  Reason unable to perform ROS: Schizophrenia.      Allergies  Penicillins  Home Medications   Prior to Admission medications   Medication Sig Start Date End Date Taking? Authorizing Provider  albuterol (PROVENTIL HFA;VENTOLIN HFA) 108 (90 BASE) MCG/ACT inhaler Inhale 2 puffs into the lungs every 6 (six) hours as needed for wheezing or shortness of breath. 02/11/15   Elease Etienne, MD  amantadine (SYMMETREL) 100 MG capsule Take 100 mg by mouth 3 (three) times daily. 01/21/15   Historical Provider, MD  aspirin EC 81 MG tablet Take 1 tablet (81 mg total) by mouth daily. 02/11/15   Elease Etienne, MD  atorvastatin (LIPITOR) 10 MG tablet Take 10 mg by mouth daily. 01/08/15    Historical Provider, MD  folic acid (FOLVITE) 1 MG tablet Take 1 tablet (1 mg total) by mouth daily. 02/11/15   Elease Etienne, MD  INVEGA SUSTENNA 156 MG/ML SUSP injection Inject 156 mg as directed every 6 (six) weeks.  01/21/15   Historical Provider, MD  isosorbide mononitrate (IMDUR) 30 MG 24 hr tablet Take 1 tablet (30 mg total) by mouth daily. 02/11/15   Elease Etienne, MD  metoprolol (LOPRESSOR) 50 MG tablet Take 1 tablet (50 mg total) by mouth 2 (two) times daily. 02/11/15   Elease Etienne, MD  Multiple Vitamin (MULTIVITAMIN WITH MINERALS) TABS tablet Take 1 tablet by mouth daily. 02/11/15   Elease Etienne, MD  nicotine (NICODERM CQ - DOSED IN MG/24 HOURS) 21 mg/24hr patch Place 1 patch (21 mg total) onto the skin daily. 02/11/15   Elease Etienne, MD  predniSONE (DELTASONE) 10 MG tablet Take 2 tablets (20 mg total) by mouth daily with breakfast. Taper by 10 mg every 2 days and eventually discontinue. 02/11/15   Elease Etienne, MD  thiamine 100 MG tablet Take 1 tablet (100 mg total) by mouth daily. 02/11/15   Elease Etienne, MD   BP 124/74 mmHg  Pulse 92  Temp(Src) 97.7 F (36.5 C) (Oral)  Resp 16  SpO2 96% Physical Exam  Constitutional: He appears well-developed and well-nourished.  No acute distress  HENT:  Head: Normocephalic and atraumatic.  Eyes: Conjunctivae and EOM are normal. Pupils are equal, round, and reactive to  light.  Neck: Normal range of motion. Neck supple.  Cardiovascular: Normal rate and regular rhythm.   Pulmonary/Chest: Effort normal and breath sounds normal.  Abdominal: Soft. Bowel sounds are normal.  Musculoskeletal: Normal range of motion.  Neurological: He is alert.  Skin: Skin is warm and dry.  Psychiatric:  Light of ideas, loose association  Nursing note and vitals reviewed.   ED Course  Procedures (including critical care time) Labs Review Labs Reviewed  COMPREHENSIVE METABOLIC PANEL - Abnormal; Notable for the following:    Sodium  133 (*)    Potassium 5.8 (*)    CO2 20 (*)    BUN 40 (*)    Creatinine, Ser 3.78 (*)    AST 61 (*)    GFR calc non Af Amer 15 (*)    GFR calc Af Amer 17 (*)    All other components within normal limits  CBC - Abnormal; Notable for the following:    WBC 24.9 (*)    RBC 3.65 (*)    Hemoglobin 11.4 (*)    HCT 33.1 (*)    All other components within normal limits  CBG MONITORING, ED - Abnormal; Notable for the following:    Glucose-Capillary 108 (*)    All other components within normal limits  ETHANOL  URINE RAPID DRUG SCREEN, HOSP PERFORMED  URINALYSIS, ROUTINE W REFLEX MICROSCOPIC (NOT AT [facility])    Imaging Review No results found. I have personally reviewed and evaluated these images and lab results as part of my medical decision-making.   EKG Interpretation   Date/Time:  Tuesday June 17 2015 18:10:30 EST Ventricular Rate:  109 PR Interval:  144 QRS Duration: 120 QT Interval:  364 QTC Calculation: 490 R Axis:   -10 Text Interpretation:  Sinus tachycardia Biatrial enlargement Right bundle  branch block Abnormal ECG Confirmed by Giovonnie Trettel  MD, Floria Brandau ([id number]) on  06/17/2015 8:06:35 PM      MDM   Final diagnoses:  Syncope, unspecified syncope type  Schizophrenia, unspecified type (HCC)  Leukocytosis  Renal failure   Uncertain etiology of questionable syncopal event.  EKG shows no acute changes. Hemoglobin stable. Renal function has worsened with a creatinine of 3.78 and potassium  5.8.   Will obtain behavioral health consult. Discussed with Dr. Sharl Ma.  Urinalysis and chest x-ray pending.  Also disc c Dr Estell Harpin who willf/u care    Donnetta Hutching, MD 06/17/15 2157

## 2015-06-17 NOTE — ED Notes (Signed)
Pt did have a knife, all belonging security gave to pt brother, Brother in the room , Hospitalist at the bedside.

## 2015-06-17 NOTE — ED Notes (Signed)
Telepsych assessment complete. Hospitalist at bedside.

## 2015-06-17 NOTE — H&P (Signed)
PCP:   Avon Gully, MD   Chief Complaint:  Fall  HPI:  70 year old who  has a past medical history of Anxiety; Schizophrenia (HCC); Pneumonia; and Hypertension. was brought to the hospital as patient was found lying on the ground. Patient's brother not sure whether he passed out. Patient seems to be very anxious. Complains of chest wall pain. Chest x-ray shows 10th rib fracture. Patient denies any other symptoms. 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  Allergies:   Allergies  Allergen Reactions  . Penicillins Anxiety      Past Medical History  Diagnosis Date  . Anxiety   . Schizophrenia (HCC)   . Pneumonia   . Hypertension     Past Surgical History  Procedure Laterality Date  . Cardiac catheterization N/A 02/10/2015    Procedure: Left Heart Cath and Coronary Angiography;  Surgeon: Orpah Cobb, MD;  Location: [facility];  Service: Cardiovascular;  Laterality: N/A;    Prior to Admission medications   Medication Sig Start Date End Date Taking? Authorizing Provider  albuterol (PROVENTIL HFA;VENTOLIN HFA) 108 (90 BASE) MCG/ACT inhaler Inhale 2 puffs into the lungs every 6 (six) hours as needed for wheezing or shortness of breath. 02/11/15   Elease Etienne, MD  amantadine (SYMMETREL) 100 MG capsule Take 100 mg by mouth 3 (three) times daily. 01/21/15   Historical Provider, MD  aspirin EC 81 MG tablet Take 1 tablet (81 mg total) by mouth daily. 02/11/15   Elease Etienne, MD  atorvastatin (LIPITOR) 10 MG tablet Take 10 mg by mouth daily. 01/08/15   Historical Provider, MD  folic acid (FOLVITE) 1 MG tablet Take 1 tablet (1 mg total) by mouth daily. 02/11/15   Elease Etienne, MD  INVEGA SUSTENNA 156 MG/ML SUSP injection Inject 156 mg as directed every 6 (six) weeks.  01/21/15   Historical Provider, MD  isosorbide mononitrate (IMDUR) 30 MG 24 hr tablet Take 1 tablet (30 mg total) by mouth daily. 02/11/15   Elease Etienne, MD  metoprolol (LOPRESSOR)  50 MG tablet Take 1 tablet (50 mg total) by mouth 2 (two) times daily. 02/11/15   Elease Etienne, MD  Multiple Vitamin (MULTIVITAMIN WITH MINERALS) TABS tablet Take 1 tablet by mouth daily. 02/11/15   Elease Etienne, MD  nicotine (NICODERM CQ - DOSED IN MG/24 HOURS) 21 mg/24hr patch Place 1 patch (21 mg total) onto the skin daily. 02/11/15   Elease Etienne, MD  predniSONE (DELTASONE) 10 MG tablet Take 2 tablets (20 mg total) by mouth daily with breakfast. Taper by 10 mg every 2 days and eventually discontinue. 02/11/15   Elease Etienne, MD  thiamine 100 MG tablet Take 1 tablet (100 mg total) by mouth daily. 02/11/15   Elease Etienne, MD    Social History:  reports that he has been smoking Cigarettes.  He has a 93 pack-year smoking history. He does not have any smokeless tobacco history on file. He reports that he does not drink alcohol or use illicit drugs.   All the positives are listed in BOLD  Review of Systems:  HEENT: Headache, blurred vision, runny nose, sore throat Neck: Hypothyroidism, hyperthyroidism,,lymphadenopathy Chest : Shortness of breath, history of COPD, Asthma Heart : Chest pain, history of coronary arterey disease GI:  Nausea, vomiting, diarrhea, constipation, GERD GU: Dysuria, urgency, frequency of urination, hematuria Neuro: Stroke, seizures, syncope Psych: Depression, anxiety, hallucinations   Physical Exam: Blood pressure 148/86, pulse 97,  temperature 97.7 F (36.5 C), temperature source Oral, resp. rate 16, SpO2 93 %. Constitutional:   Patient is a well-developed and well-nourished male in no acute distress and cooperative with exam. Head: Normocephalic and atraumatic Mouth: Mucus membranes moist Eyes: PERRL, EOMI, conjunctivae normal Neck: Supple, No Thyromegaly Cardiovascular: RRR, S1 normal, S2 normal Pulmonary/Chest: CTAB, no wheezes, rales, or rhonchi Abdominal: Soft. Non-tender, non-distended, bowel sounds are normal, no masses, organomegaly,  or guarding present.  Neurological: A&O x3, Strength is normal and symmetric bilaterally, cranial nerve II-XII are grossly intact, no focal motor deficit, sensory intact to light touch bilaterally.  Extremities : No Cyanosis, Clubbing or Edema  Labs on Admission:  Basic Metabolic Panel:  Recent Labs Lab 06/17/15 1910  NA 133*  K 5.8*  CL 101  CO2 20*  GLUCOSE 89  BUN 40*  CREATININE 3.78*  CALCIUM 9.0   Liver Function Tests:  Recent Labs Lab 06/17/15 1910  AST 61*  ALT 21  ALKPHOS 54  BILITOT 0.6  PROT 7.1  ALBUMIN 4.0    CBC:  Recent Labs Lab 06/17/15 1910  WBC 24.9*  HGB 11.4*  HCT 33.1*  MCV 90.7  PLT 342    BNP (last 3 results)  Recent Labs  02/01/15 1530 02/03/15 0835  BNP 222.0* 602.0*     CBG:  Recent Labs Lab 06/17/15 1816 06/17/15 1904  GLUCAP 105* 108*    Radiological Exams on Admission: Dg Chest 2 View  06/17/2015  CLINICAL DATA:  Confusion yesterday and this evening. Syncope. Elevated white cell count. EXAM: CHEST  2 VIEW COMPARISON:  02/05/2015 FINDINGS: Normal heart size and pulmonary vascularity. Mild diffuse interstitial pattern to the lungs is nonspecific but unchanged since previous study, suggesting possible chronic process. No focal airspace disease or consolidation. No blunting of costophrenic angles. No pneumothorax. There is an acute appearing fracture of the anterior right tenth rib. IMPRESSION: Diffuse interstitial pattern to the lungs appears chronic. Acute appearing right tenth rib fracture. Electronically Signed   By: Burman Nieves M.D.   On: 06/17/2015 22:24    EKG: Independently reviewed. 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 Will check renal ultrasound, start IV fluids. Follow BMP in a.m.  Hyperkalemia Patient's potassium of 5.8, will give Kayexalate 15 g by mouth 1 Check BMP in a.m.  Rib fracture Chest x-ray shows 10th rib fracture on right side. Start Vicodin when necessary for pain  Anxiety /insomnia Patient brother's says  that he drinks Coke and soda 24 hours a day. Patient has excessive caffeine intake, unable to sleep at night. Will start clonazepam 0.5 mg by mouth daily at bedtime  Schizophrenia Patient denies hearing voices at this time He is not on medications for schizophrenia Will benefit from psych evaluation  DVT prophylaxis Lovenox  Code status: Full code  Family discussion: Admission, patients condition and plan of care including tests being ordered have been discussed with the patient and his brother at bedside* who indicate understanding and agree with the plan and Code Status.   Time Spent on Admission: 60 min  Santiel Topper S [facility] Pager: [phone] 06/17/2015, 11:02 PM  If 7PM-7AM, please contact night-coverage  [email or web address]  Password [email or web address]

## 2015-06-17 NOTE — BH Assessment (Addendum)
Tele Assessment Note   Jared Austin is an 70 y.o. male. Pt was a poor historian, limiting information clinician was able to obtain. Pt would also stop speaking intermittently throughout session, choosing not to respond to inquiries.   Pt reported SI/HI, thoughts of harm or previous inpatient admissions. Pt seemed to endorse AVH however, pt response was unclear and pt refused to speak when clinician sought clarification. Writer does note that pt appeared to be responding to internal stimuli. Pt denied presence of any mental health conditions.   Pt denied Midwife however, chart indicates active POA.   Pt reports no difficulty walking upstairs or getting in and out of bed. Pt does report use of cane to assist with ambulation.   [facility] [facility] calendar search revealed no upcoming court dates.   Pt appeared preoccupied and disheveled with incoherent speech at times. Thought content was disorganized and concentration and insight were poor.   Pt voiced that he wanted to go home and would not sign himself in voluntarily if recommended for inpatient admission.  Pt UDs was clear and BAL <5.  Per pt chart: PT brother states last known well with no confusion on 06/15/15. PT brother reports pt was confused yesterday and this evening he went out to smoke   Diagnosis: Schizophrenia (per chart)  Past Medical History:  Past Medical History  Diagnosis Date  . Anxiety   . Schizophrenia (HCC)   . Pneumonia   . Hypertension     Past Surgical History  Procedure Laterality Date  . Cardiac catheterization N/A 02/10/2015    Procedure: Left Heart Cath and Coronary Angiography;  Surgeon: Orpah Cobb, MD;  Location: [facility];  Service: Cardiovascular;  Laterality: N/A;    Family History: History reviewed. No pertinent family history.  Social History:  reports that he has been smoking Cigarettes.  He has a 93 pack-year smoking history. He does not have any smokeless tobacco  history on file. He reports that he does not drink alcohol or use illicit drugs.  Additional Social History:  Alcohol / Drug Use Pain Medications: None Reported Prescriptions: None Reported Over the Counter: None Reported History of alcohol / drug use?: No history of alcohol / drug abuse  CIWA: CIWA-Ar BP: 148/86 mmHg Pulse Rate: 97 COWS:    PATIENT STRENGTHS: (choose at least two) Average or above average intelligence Physical Health  Allergies:  Allergies  Allergen Reactions  . Penicillins Anxiety    Home Medications:  (Not in a hospital admission)  OB/GYN Status:  No LMP for male patient.  General Assessment Data Location of Assessment: [facility] TTS Assessment: In system Is this a Tele or Face-to-Face Assessment?: Tele Assessment Is this an Initial Assessment or a Re-assessment for this encounter?: Initial Assessment Marital status: Single Is patient pregnant?: No Pregnancy Status: No Living Arrangements: Other relatives (Brother) Can pt return to current living arrangement?: Yes Admission Status: Voluntary Is patient capable of signing voluntary admission?: No (Due to pt mental status) Referral Source: Self/Family/Friend Insurance type: Medicare     Crisis Care Plan Living Arrangements: Other relatives (Brother) Name of Psychiatrist: None Name of Therapist: None  Education Status Is patient currently in school?: No Highest grade of school patient has completed: UTA due to pt mental status Contact person: None Reported  Risk to self with the past 6 months Suicidal Ideation: No Has patient been a risk to self within the past 6 months prior to admission? : No Suicidal Intent: No  Has patient had any suicidal intent within the past 6 months prior to admission? : No Is patient at risk for suicide?: No Suicidal Plan?: No Has patient had any suicidal plan within the past 6 months prior to admission? : No Access to Means:  (NA) What has been your use of  drugs/alcohol within the last 12 months?: Pt denies substance use Previous Attempts/Gestures: No How many times?: 0 Other Self Harm Risks: None Reported Intentional Self Injurious Behavior: None Family Suicide History: Unable to assess (UTA due to pt mental status) Recent stressful life event(s):  (UTA due to pt mental status) Persecutory voices/beliefs?:  (UTA due to pt mental status) Depression: No Depression Symptoms:  (UTA due to pt mental status) Substance abuse history and/or treatment for substance abuse?: No Suicide prevention information given to non-admitted patients: Not applicable  Risk to Others within the past 6 months Homicidal Ideation: No Does patient have any lifetime risk of violence toward others beyond the six months prior to admission? : No Thoughts of Harm to Others: No Current Homicidal Intent: No Current Homicidal Plan: No Access to Homicidal Means: No History of harm to others?:  (UTA due to pt mental status) Assessment of Violence: None Noted Does patient have access to weapons?: Yes (Comment) (Knives, pt showed writer possible knife in his pocket) Criminal Charges Pending?:  (UTA due to pt mental status) Does patient have a court date:  (UTA due to pt mental status) Is patient on probation?:  (UTA due to pt mental status)  Psychosis Hallucinations: Auditory, Visual Delusions:  (UTA due to pt mental status)  Mental Status Report Appearance/Hygiene: Disheveled Eye Contact: Poor Motor Activity: Unremarkable Speech: Incoherent Level of Consciousness: Quiet/awake Mood: Preoccupied Affect: Preoccupied Anxiety Level: Minimal Thought Processes:  (disorganized) Judgement: Partial Orientation: Person, Situation Obsessive Compulsive Thoughts/Behaviors: None  Cognitive Functioning Concentration: Poor Memory: Recent Intact, Remote Intact IQ: Average Insight: Poor Impulse Control: Poor Appetite: Fair Weight Loss:  (UTA due to pt mental status) Weight  Gain:  (UTA due to pt mental status) Sleep: Unable to Assess Total Hours of Sleep: 5 Vegetative Symptoms: Unable to Assess (UTA due to pt mental status)  ADLScreening [facility] Assessment Services) Patient's cognitive ability adequate to safely complete daily activities?: Yes Patient able to express need for assistance with ADLs?: Yes Independently performs ADLs?: No  Prior Inpatient Therapy Prior Inpatient Therapy: No  Prior Outpatient Therapy Prior Outpatient Therapy: No Does patient have an ACCT team?: No Does patient have Intensive In-House Services?  : No Does patient have [facility] services? : No Does patient have P4CC services?: No  ADL Screening (condition at time of admission) Patient's cognitive ability adequate to safely complete daily activities?: Yes Is the patient deaf or have difficulty hearing?: Yes (Pt reports difficulty with hearing) Does the patient have difficulty seeing, even when wearing glasses/contacts?: No Does the patient have difficulty concentrating, remembering, or making decisions?: Yes Patient able to express need for assistance with ADLs?: Yes Does the patient have difficulty dressing or bathing?: No Independently performs ADLs?: No Communication: Independent Dressing (OT): Independent Grooming: Independent Feeding: Independent Bathing: Independent Toileting: Independent In/Out Bed: Independent Walks in Home: Independent with device (comment) (Pt reports use of cane to assist with ambulation) Does the patient have difficulty walking or climbing stairs?: No (Pt denies difficulty but does report cane use) Weakness of Legs: None Weakness of Arms/Hands: None  Home Assistive Devices/Equipment Home Assistive Devices/Equipment: Cane (specify quad or straight) (Not Specified)  Therapy Consults (therapy consults require a  physician order) PT Evaluation Needed: No OT Evalulation Needed: No SLP Evaluation Needed: No Abuse/Neglect Assessment (Assessment to  be complete while patient is alone) Physical Abuse:  (UTA due to pt disposition) Verbal Abuse:  (UTA due to pt disposition) Sexual Abuse:  (UTA due to pt disposition) Exploitation of patient/patient's resources:  (UTA due to pt disposition) Self-Neglect:  (UTA due to pt disposition) Possible abuse reported to::  (UTA due to pt disposition) Values / Beliefs Cultural Requests During Hospitalization: None Spiritual Requests During Hospitalization: None Consults Spiritual Care Consult Needed: No Social Work Consult Needed: No Merchant navy officer (For Healthcare) Does patient have an advance directive?: Yes Would patient like information on creating an advanced directive?: No - patient declined information Type of Advance Directive: Healthcare Power of Attorney (Careers adviser (brother)) Does patient want to make changes to advanced directive?: No - Patient declined Copy of advanced directive(s) in chart?: Yes    Additional Information 1:1 In Past 12 Months?: No CIRT Risk: No Elopement Risk: Yes Does patient have medical clearance?: Yes     Disposition: Per Donell Sievert, PA pt disposition pending collection of collateral information regarding pt hx.  Disposition Initial Assessment Completed for this Encounter: Yes Disposition of Patient: Other dispositions (Awaiting Psychiatric Extender Recommendation)  Twyla Dais J [place] 06/17/2015 11:05 PM

## 2015-06-17 NOTE — BH Assessment (Signed)
Writer spoke with pt RN Galen Daft) to request cart placement for assessment.

## 2015-06-17 NOTE — ED Notes (Signed)
BH called after assessment, could not see clearly on screen, pt states he has a knife in his pocket, security called to wand pt.

## 2015-06-17 NOTE — ED Notes (Addendum)
PT brother states last known well with no confusion on 06/15/15. PT brother reports pt was confused yesterday and this evening he went out to smoke and laid down in the yard and not making sense of his words and has been talking to the wall.

## 2015-06-17 NOTE — BHH Counselor (Signed)
Per Donell Sievert, PA request- writer unsucesfully attempted to contact brother Judithann Sheen [phone], [phone]) to obtain collateral information. Writer will attempt second contact at later time.

## 2015-06-18 ENCOUNTER — Inpatient Hospital Stay (HOSPITAL_COMMUNITY): Payer: Medicare Other

## 2015-06-18 DIAGNOSIS — N179 Acute kidney failure, unspecified: Principal | ICD-10-CM

## 2015-06-18 LAB — CBC
HCT: 31.3 % — ABNORMAL LOW (ref 39.0–52.0)
Hemoglobin: 10.5 g/dL — ABNORMAL LOW (ref 13.0–17.0)
MCH: 30.4 pg (ref 26.0–34.0)
MCHC: 33.5 g/dL (ref 30.0–36.0)
MCV: 90.7 fL (ref 78.0–100.0)
PLATELETS: 335 10*3/uL (ref 150–400)
RBC: 3.45 MIL/uL — AB (ref 4.22–5.81)
RDW: 13 % (ref 11.5–15.5)
WBC: 13.1 10*3/uL — ABNORMAL HIGH (ref 4.0–10.5)

## 2015-06-18 LAB — COMPREHENSIVE METABOLIC PANEL
ALT: 21 U/L (ref 17–63)
AST: 50 U/L — ABNORMAL HIGH (ref 15–41)
Albumin: 3.7 g/dL (ref 3.5–5.0)
Alkaline Phosphatase: 56 U/L (ref 38–126)
Anion gap: 12 (ref 5–15)
BUN: 49 mg/dL — ABNORMAL HIGH (ref 6–20)
CHLORIDE: 104 mmol/L (ref 101–111)
CO2: 22 mmol/L (ref 22–32)
CREATININE: 3.81 mg/dL — AB (ref 0.61–1.24)
Calcium: 8.9 mg/dL (ref 8.9–10.3)
GFR, EST AFRICAN AMERICAN: 17 mL/min — AB (ref >60)
GFR, EST NON AFRICAN AMERICAN: 15 mL/min — AB (ref >60)
Glucose, Bld: 92 mg/dL (ref 65–99)
Potassium: 4.7 mmol/L (ref 3.5–5.1)
Sodium: 138 mmol/L (ref 135–145)
TOTAL PROTEIN: 6.8 g/dL (ref 6.5–8.1)
Total Bilirubin: 0.7 mg/dL (ref 0.3–1.2)

## 2015-06-18 MED ORDER — ASPIRIN EC 81 MG PO TBEC
81.0000 mg | DELAYED_RELEASE_TABLET | Freq: Every day | ORAL | Status: DC
  Administered 2015-06-18 – 2015-06-24 (×7): 81 mg via ORAL
  Filled 2015-06-18 (×7): qty 1

## 2015-06-18 MED ORDER — SODIUM POLYSTYRENE SULFONATE 15 GM/60ML PO SUSP
15.0000 g | Freq: Once | ORAL | Status: AC
  Administered 2015-06-18: 15 g via ORAL
  Filled 2015-06-18: qty 60

## 2015-06-18 MED ORDER — LORAZEPAM 2 MG/ML IJ SOLN: 1.0000 mg | Freq: Once | INTRAMUSCULAR | Status: DC

## 2015-06-18 MED ORDER — SODIUM CHLORIDE 0.9 % IV SOLN
INTRAVENOUS | Status: DC
  Administered 2015-06-18 – 2015-06-20 (×5): via INTRAVENOUS

## 2015-06-18 MED ORDER — LORAZEPAM 2 MG/ML IJ SOLN
0.5000 mg | INTRAMUSCULAR | Status: DC | PRN
  Administered 2015-06-18 – 2015-06-20 (×6): 0.5 mg via INTRAVENOUS
  Filled 2015-06-18 (×7): qty 1

## 2015-06-18 MED ORDER — ONDANSETRON HCL 4 MG PO TABS: 4.0000 mg | ORAL_TABLET | Freq: Four times a day (QID) | ORAL | Status: DC | PRN

## 2015-06-18 MED ORDER — ADULT MULTIVITAMIN W/MINERALS CH
1.0000 | ORAL_TABLET | Freq: Every day | ORAL | Status: DC
  Administered 2015-06-18 – 2015-06-24 (×7): 1 via ORAL
  Filled 2015-06-18 (×7): qty 1

## 2015-06-18 MED ORDER — ACETAMINOPHEN 325 MG PO TABS
650.0000 mg | ORAL_TABLET | Freq: Four times a day (QID) | ORAL | Status: DC | PRN
  Administered 2015-06-18 – 2015-06-24 (×5): 650 mg via ORAL
  Filled 2015-06-18 (×6): qty 2

## 2015-06-18 MED ORDER — HALOPERIDOL LACTATE 5 MG/ML IJ SOLN
5.0000 mg | Freq: Once | INTRAMUSCULAR | Status: AC
  Administered 2015-06-18: 5 mg via INTRAVENOUS
  Filled 2015-06-18: qty 1

## 2015-06-18 MED ORDER — HYDROCODONE-ACETAMINOPHEN 5-325 MG PO TABS
1.0000 | ORAL_TABLET | ORAL | Status: DC | PRN
  Administered 2015-06-18: 2 via ORAL
  Administered 2015-06-18: 1 via ORAL
  Filled 2015-06-18: qty 2
  Filled 2015-06-18: qty 1

## 2015-06-18 MED ORDER — NICOTINE 21 MG/24HR TD PT24
21.0000 mg | MEDICATED_PATCH | Freq: Every day | TRANSDERMAL | Status: DC
  Administered 2015-06-18 – 2015-06-24 (×7): 21 mg via TRANSDERMAL
  Filled 2015-06-18 (×7): qty 1

## 2015-06-18 MED ORDER — VITAMIN B-1 100 MG PO TABS
100.0000 mg | ORAL_TABLET | Freq: Every day | ORAL | Status: DC
  Administered 2015-06-18 – 2015-06-24 (×7): 100 mg via ORAL
  Filled 2015-06-18 (×7): qty 1

## 2015-06-18 MED ORDER — AMANTADINE HCL 100 MG PO CAPS
100.0000 mg | ORAL_CAPSULE | Freq: Three times a day (TID) | ORAL | Status: DC
  Administered 2015-06-18: 100 mg via ORAL
  Filled 2015-06-18 (×7): qty 1

## 2015-06-18 MED ORDER — FOLIC ACID 1 MG PO TABS
1.0000 mg | ORAL_TABLET | Freq: Every day | ORAL | Status: DC
  Administered 2015-06-18 – 2015-06-24 (×7): 1 mg via ORAL
  Filled 2015-06-18 (×7): qty 1

## 2015-06-18 MED ORDER — CLONAZEPAM 0.5 MG PO TABS: 0.5000 mg | ORAL_TABLET | Freq: Every day | ORAL | Status: DC

## 2015-06-18 MED ORDER — ATORVASTATIN CALCIUM 10 MG PO TABS
10.0000 mg | ORAL_TABLET | Freq: Every day | ORAL | Status: DC
  Administered 2015-06-18 – 2015-06-24 (×7): 10 mg via ORAL
  Filled 2015-06-18 (×7): qty 1

## 2015-06-18 MED ORDER — CETYLPYRIDINIUM CHLORIDE 0.05 % MT LIQD
7.0000 mL | Freq: Two times a day (BID) | OROMUCOSAL | Status: DC
  Administered 2015-06-18 – 2015-06-24 (×14): 7 mL via OROMUCOSAL

## 2015-06-18 MED ORDER — METOPROLOL TARTRATE 50 MG PO TABS
50.0000 mg | ORAL_TABLET | Freq: Two times a day (BID) | ORAL | Status: DC
  Administered 2015-06-18 – 2015-06-21 (×8): 50 mg via ORAL
  Filled 2015-06-18 (×8): qty 1

## 2015-06-18 MED ORDER — LORAZEPAM 2 MG/ML IJ SOLN
1.0000 mg | INTRAMUSCULAR | Status: DC | PRN
  Administered 2015-06-18: 1 mg via INTRAVENOUS
  Filled 2015-06-18: qty 1

## 2015-06-18 MED ORDER — ONDANSETRON HCL 4 MG/2ML IJ SOLN
4.0000 mg | Freq: Four times a day (QID) | INTRAMUSCULAR | Status: DC | PRN
  Administered 2015-06-20: 4 mg via INTRAVENOUS
  Filled 2015-06-18: qty 2

## 2015-06-18 MED ORDER — ISOSORBIDE MONONITRATE ER 30 MG PO TB24
30.0000 mg | ORAL_TABLET | Freq: Every day | ORAL | Status: DC
  Administered 2015-06-18 – 2015-06-24 (×7): 30 mg via ORAL
  Filled 2015-06-18 (×7): qty 1

## 2015-06-18 MED ORDER — ENOXAPARIN SODIUM 30 MG/0.3ML ~~LOC~~ SOLN
30.0000 mg | SUBCUTANEOUS | Status: DC
  Administered 2015-06-18 – 2015-06-23 (×6): 30 mg via SUBCUTANEOUS
  Filled 2015-06-18 (×6): qty 0.3

## 2015-06-18 NOTE — Progress Notes (Signed)
0106 Patient confused at this time and noted to have brown wallet and black change purse in jogging pant pocket. Patient's belongings observed x 3 RN's with no cash or checks noted in wallet, only ID card and papers. Set of keys only noted in black change purse. Patient's wallet and change purse placed in bag, patient sticker applied and placed in patient belongings bag left in patient's room.

## 2015-06-18 NOTE — Progress Notes (Signed)
PROGRESS NOTE  Santa Genera ZOX:[id number] DOB: March 22, 1946 DOA: 06/17/2015 PCP: Avon Gully, MD  Summary: 58 yom with a hx of Anxiety; Schizophrenia (HCC); Pneumonia; and Hypertension. was brought to the hospital after he was found lying on the ground. Patient's brother not sure whether he passed out. Patient appears to be very anxious and complains of chest wall pain. Chest x-ray shows 10th rib fracture. Patient denies any other symptoms. 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  Assessment/Plan: 1. AKI superimposed on CKD stage 3. Etiology unclear, though possible prerenal. Nonoliguric. Renal ultrasound unremarkable. 2. Hyperkalemia, resolved since Kayexalate PO  3. 10th rib Fx revealed by CXR, presumably related to fall 4. Fall at home, cannot exclude syncope, no further history available at this time.  5. Anxiety/ insomnia, excessive caffeine intake by history 6. Schizophrenia, not on any outpatient medications according to list 7. Anemia, possible related to hemodilution. Hgb 10.5    Patient remains confused and agitated, fall risk, attempting to get out of bed. Sitter at bedside. Baseline mental status unknown. No family at bedside. Details surrounding fall at home remain unclear.   Continue IV fluids, recheck basic metabolic panel in the morning. WBC trending down, no evidence of infection.   TTS/psychiatry consult when able to participate  Willdiscuss with family when able  Code Status: Full DVT prophylaxis: Lovenox Family Communication: No family present at bedside Disposition Plan: discharge once improved  [name], MD  [facility]  Pager [phone] If 7PM-7AM, please contact night-coverage at [email or web address], password [facility] 06/18/2015, 8:58 AM  LOS: 1 day   Consultants:  none  Procedures:  none  Antibiotics:  none  HPI/Subjective: Per sitter, he has been confused and agitated.  Patient does not answer questions  appropriately.  Objective: Filed Vitals:   06/17/15 2256 06/18/15 0012 06/18/15 0100 06/18/15 0554  BP: 145/80  146/61 138/68  Pulse: 97  92 88  Temp:   98 F (36.7 C) 98.1 F (36.7 C)  TempSrc:   Axillary Oral  Resp:   18 18  Height:   6' (1.829 m)   Weight:  57.1 kg (125 lb 14.1 oz)    SpO2: 92%  95% 96%   No intake or output data in the 24 hours ending 06/18/15 0858   Filed Weights   06/18/15 0012  Weight: 57.1 kg (125 lb 14.1 oz)    Exam:    Afebrile, VSS, not hypoxic General:  Appears calm and comfortable Eyes: arcus senilis, pupils appear unremarkable. ENT: Lips and tongue appear unremarkable although exam is limited Neck: Appears unremarkable Cardiovascular: RRR, no m/r/g. No LE edema. Respiratory: CTA bilaterally, no w/r/r. Normal respiratory effort. Abdomen: soft, ntnd Skin: no rash or induration noted Musculoskeletal: grossly normal tone BUE/BLE Psychiatric: cannot assess Neurologic: grossly non-focal.   New data reviewed:  BUN/creatinine 49 and 3.81  WBC 13.1  Hgb stable at 10.5  US renal revealed Left renal cyst growing in size from the prior study. No other focal abnormality is seen.  Pertinent data since admission:  DG chest revealed acute Fx of 10th rib  Pending data:  none  Scheduled Meds: . amantadine  100 mg Oral TID  . antiseptic oral rinse  7 mL Mouth Rinse BID  . aspirin EC  81 mg Oral Daily  . atorvastatin  10 mg Oral Daily  . clonazePAM  0.5 mg Oral QHS  . enoxaparin (LOVENOX) injection  30 mg Subcutaneous Q24H  . folic acid  1 mg Oral Daily  .  isosorbide mononitrate  30 mg Oral Daily  . metoprolol  50 mg Oral BID  . multivitamin with minerals  1 tablet Oral Daily  . nicotine  21 mg Transdermal Daily  . thiamine  100 mg Oral Daily   Continuous Infusions: . sodium chloride 100 mL/hr at 06/18/15 0007    Active Problems:   Essential hypertension   Schizophrenia (HCC)   Anxiety state   Dehydration   AKI (acute kidney  injury) (HCC)   Time spent 25 minutes   By signing my name below, I, Adron Bene, attest that this documentation has been prepared under the direction and in the presence of Keevon Henney P. Irene Limbo, MD. Electronically Signed: Adron Bene, Scribe. 06/18/2015  12:55pm   I personally performed the services described in this documentation. All medical record entries made by the scribe were at my direction. I have reviewed the chart and agree that the record reflects my personal performance and is accurate and complete. [name], MD

## 2015-06-18 NOTE — Plan of Care (Signed)
Report received. Assume care of patient. Jared Austin SN [facility] 

## 2015-06-18 NOTE — Plan of Care (Signed)
Change of shift. Report given to [facility] and [place]

## 2015-06-18 NOTE — Plan of Care (Signed)
Sitter at bedside. Bernette Mayers SN [facility]

## 2015-06-18 NOTE — Progress Notes (Signed)
0005 Patient arrived to 300 dept room# 315 rambling to himself, fidgety, reaching & thrusting into the air & having conversations with persons who are not there. Patient unable to answer admission questions at this time. Oriented to self, disoriented to place, time & situation. Patient HIGH fall risk and HIGH fall risk protocol initiated. Bed in lowest position, call bell within reach at this time. No family at the bedside at this time.

## 2015-06-18 NOTE — Progress Notes (Signed)
Was informed that TTS was attempting to contact pt's brother to obtain collateral information re: his history of psychiatric illness and also events leading to ED arrival. [facility] [phone], [phone]. Left voicemail at first number and second number does not have a voicemail option.   Ilean Skill, MSW, LCSW Clinical Social Work, Disposition  06/18/2015 [phone]

## 2015-06-18 NOTE — BH Assessment (Signed)
Writer made second attempt to contact pt brother Judithann Sheen [phone], [phone]) to obtain collateral information per Donell Sievert, PA request. Writer was unable to leave voicemail on mobile phone. Writer left HIPAA compliant voicemail on home phone to return call.

## 2015-06-19 ENCOUNTER — Inpatient Hospital Stay (HOSPITAL_COMMUNITY): Payer: Medicare Other

## 2015-06-19 DIAGNOSIS — R748 Abnormal levels of other serum enzymes: Secondary | ICD-10-CM

## 2015-06-19 DIAGNOSIS — G9340 Encephalopathy, unspecified: Secondary | ICD-10-CM

## 2015-06-19 LAB — CBC
HCT: 31.8 % — ABNORMAL LOW (ref 39.0–52.0)
HEMOGLOBIN: 10.7 g/dL — AB (ref 13.0–17.0)
MCH: 30.6 pg (ref 26.0–34.0)
MCHC: 33.6 g/dL (ref 30.0–36.0)
MCV: 90.9 fL (ref 78.0–100.0)
PLATELETS: 315 10*3/uL (ref 150–400)
RBC: 3.5 MIL/uL — ABNORMAL LOW (ref 4.22–5.81)
RDW: 13.2 % (ref 11.5–15.5)
WBC: 13.7 10*3/uL — ABNORMAL HIGH (ref 4.0–10.5)

## 2015-06-19 LAB — BLOOD GAS, ARTERIAL
Acid-Base Excess: 6.9 mmol/L — ABNORMAL HIGH (ref 0.0–2.0)
Bicarbonate: 18.8 mEq/L — ABNORMAL LOW (ref 20.0–24.0)
Drawn by: 21310
O2 CONTENT: 4 L/min
O2 Saturation: 82.4 %
PCO2 ART: 30.5 mmHg — AB (ref 35.0–45.0)
PH ART: 7.372 (ref 7.350–7.450)
Patient temperature: 37
TCO2: 11.9 mmol/L (ref 0–100)
pO2, Arterial: 51.2 mmHg — ABNORMAL LOW (ref 80.0–100.0)

## 2015-06-19 LAB — CK: Total CK: 741 U/L — ABNORMAL HIGH (ref 49–397)

## 2015-06-19 LAB — BASIC METABOLIC PANEL
Anion gap: 11 (ref 5–15)
BUN: 46 mg/dL — AB (ref 6–20)
CALCIUM: 8.7 mg/dL — AB (ref 8.9–10.3)
CO2: 21 mmol/L — AB (ref 22–32)
Chloride: 108 mmol/L (ref 101–111)
Creatinine, Ser: 2.76 mg/dL — ABNORMAL HIGH (ref 0.61–1.24)
GFR calc Af Amer: 26 mL/min — ABNORMAL LOW (ref >60)
GFR, EST NON AFRICAN AMERICAN: 22 mL/min — AB (ref >60)
GLUCOSE: 78 mg/dL (ref 65–99)
Potassium: 4.5 mmol/L (ref 3.5–5.1)
Sodium: 140 mmol/L (ref 135–145)

## 2015-06-19 MED ORDER — ENSURE ENLIVE PO LIQD
237.0000 mL | Freq: Two times a day (BID) | ORAL | Status: DC
  Administered 2015-06-19 (×2): 237 mL via ORAL

## 2015-06-19 MED ORDER — AMANTADINE HCL 100 MG PO CAPS
100.0000 mg | ORAL_CAPSULE | Freq: Three times a day (TID) | ORAL | Status: DC
  Administered 2015-06-19 – 2015-06-24 (×14): 100 mg via ORAL
  Filled 2015-06-19 (×25): qty 1

## 2015-06-19 MED ORDER — BOOST / RESOURCE BREEZE PO LIQD
1.0000 | Freq: Three times a day (TID) | ORAL | Status: DC
  Administered 2015-06-19 – 2015-06-24 (×12): 1 via ORAL

## 2015-06-19 NOTE — Progress Notes (Signed)
PROGRESS NOTE  Jared Austin VWU:[id number] DOB: Jan 24, 1946 DOA: 06/17/2015 PCP: Avon Gully, MD  Summary: 70 year old male with history of schizophrenia was brought to the emergency department after being found on the ground by his brother. Initial evaluation revealed acute kidney injury, leukocytosis and acute confusional state.  Assessment/Plan: 1. AKI superimposed on CKD stage 3. Nonoliguric. Improving with hydration. Renal ultrasound unremarkable. Suspect related to poor oral intake. 2. Hyperkalemia, resolved with Kayexalate. 3. 10th rib Fx revealed by CXR, presumably related to fall. Appears asymptomatic. 4. Fall at home, cannot exclude syncope. Per his brother, he walked out to take a smoke, and then was found on the ground. Patient unable to provide further history. Mildly elevated CK, likely related to fall. No strong history to suggest seizure. Exam is nonfocal, no history to suggest CVA. 5. Anxiety/ insomnia, excessive caffeine intake by history 6. Schizophrenia, according to his brother was on an injectable medication that controlled her symptoms well, however 2-3 days prior to admission he developed confusion, hallucinations and was appearing to respond to internal stimuli. 7. Anemia, possible related to hemodilution. Hgb 10.7. Stable.    No significant change. Patient remains confused and agitated, fall risk. Sitter at bedside. Renal functions improving and he has no evidence of infection. Exam is nonfocal, no evidence of CNS process. Suspect related to schizophrenia. Unknown when last had an injectable medication.  Continue IV fluids. Check CK in morning  Check CT head and EEG if patient can comply.    Psychiatry consult   Code Status: Full DVT prophylaxis: Lovenox Family Communication: No family present at bedside Disposition Plan: discharge once improved  [name], MD  [facility]  Pager [phone] If 7PM-7AM, please contact night-coverage at  [email or web address], password [facility] 06/19/2015, 7:10 AM  LOS: 2 days   Consultants:  none  Procedures:  none  Antibiotics:  none  HPI/Subjective: Per sitter, patient is still confused and agitated. He only ate a few bites of food this morning and tried to spit out his medications.  Patient denies any pain however history is unreliable.    Objective: Filed Vitals:   06/18/15 0554 06/18/15 0958 06/18/15 1447 06/18/15 2100  BP: 138/68 131/67 100/61 123/92  Pulse: 88 83 78 81  Temp: 98.1 F (36.7 C) 98.6 F (37 C) 98.8 F (37.1 C) 98.6 F (37 C)  TempSrc: Oral Oral Oral Axillary  Resp: Height:      Weight:      SpO2: 96% 90% 84% 91%    Intake/Output Summary (Last 24 hours) at 06/19/15 0710 Last data filed at 06/19/15 0342  Gross per 24 hour  Intake   1015 ml  Output   1300 ml  Net   -285 ml     Filed Weights   06/18/15 0012  Weight: 57.1 kg (125 lb 14.1 oz)    Exam:    Afebrile, VSS, not hypoxic General:  Appears comfortable, calm. Eyes: PERRL, normal lids ENT: grossly normal hearing. Dry mucous membranes Cardiovascular: Regular rate and rhythm, no murmur, rub or gallop. No lower extremity edema. Respiratory: Clear to auscultation bilaterally, no wheezes, rales or rhonchi. Normal respiratory effort. Abdomen: soft, ntnd Musculoskeletal: grossly normal tone bilateral upper and lower extremities. Moves all extremities spontaneously.  Psychiatric: Answers simple questions, oriented to self only. Opens eyes and mouth to command. Neurologic: grossly non-focal.  New data reviewed:  WBC 13.7, Hgb stable at 10.7   BUN 46, Creatinine at 2.79 - improving  CK 741  UOP  1300  Pertinent data since admission:  DG chest revealed acute Fx of 10th rib  Pending data:  none  Scheduled Meds: . antiseptic oral rinse  7 mL Mouth Rinse BID  . aspirin EC  81 mg Oral Daily  . atorvastatin  10 mg Oral Daily  . enoxaparin (LOVENOX) injection  30 mg  Subcutaneous Q24H  . folic acid  1 mg Oral Daily  . isosorbide mononitrate  30 mg Oral Daily  . metoprolol  50 mg Oral BID  . multivitamin with minerals  1 tablet Oral Daily  . nicotine  21 mg Transdermal Daily  . thiamine  100 mg Oral Daily   Continuous Infusions: . sodium chloride 150 mL/hr (06/18/15 9811)    Principal Problem:   AKI (acute kidney injury) (HCC) Active Problems:   Essential hypertension   Schizophrenia (HCC)   Anxiety state   Dehydration   Elevated CK   Acute encephalopathy   Time spent 25 minutes   By signing my name below, I, Burnett Harry attest that this documentation has been prepared under the direction and in the presence of [name], MD Electronically signed: Burnett Harry, Scribe. 06/19/2015   I personally performed the services described in this documentation. All medical record entries made by the scribe were at my direction. I have reviewed the chart and agree that the record reflects my personal performance and is accurate and complete. [name], MD

## 2015-06-19 NOTE — Progress Notes (Signed)
Spoke with [facility] regarding tele psych order.  Stated patient would be seen by NP in the morning on 2/17.  Dr. Irene Limbo paged and made aware.

## 2015-06-19 NOTE — Progress Notes (Signed)
Initial Nutrition Assessment  DOCUMENTATION CODES:   Underweight  INTERVENTION:  -d/c Ensure Enlive  -Provide Boost BreezeTID, 250 kcal, 9 g of protein   NUTRITION DIAGNOSIS:   Inadequate oral intake related to inability to eat, poor appetite as evidenced by per patient/family report, meal completion < 50%.  GOAL:   Patient will meet greater than or equal to 90% of their needs  MONITOR:   PO intake, Supplement acceptance, Labs, Weight trends  REASON FOR ASSESSMENT:   Malnutrition Screening Tool  ASSESSMENT:   Pt with a hx of Anxiety; Schizophrenia; Pneumonia; and Hypertension. was brought to the hospital after he was found lying on the ground.  Pt seen for MST. Pt was agitated during time of visit. Per family member did not know if pt's appetite has changed. Nurse tech at bedside, she states he did not eat much of his breakfast and refused his ensure. Per chart he is consuming 10-50% of meals. Will d/c Ensure and try Boost Breeze to see if the pt is more accepting of this supplement. Per MD note states that pt drinks Coke and soda 24 hours a day, pt has excessive caffeine intake.   Per family member pt has lost 10 lb (8%) within  3 months, which is significant for time frame.   Unable to conduct nutrition focused physical exam because pt was agitated during visit. Visualized that pt does have muscle and fat wasting but unable to determine to what degree. Will attempt to preform exam on follow up. Pt is classified as underweight with BMI 17.   Suspect severe mal nutrition in context of acute illness. Unable to get diet history or perform NFPE, but percent weight loss and visualized muscle and fat wasting support suspicion.  Medications reviewed. Labs reviewed.   Diet Order:  Diet Heart Room service appropriate?: Yes; Fluid consistency:: Thin  Skin:  Reviewed, no issues  Last BM:  unkown  Height:   Ht Readings from Last 1 Encounters:  06/18/15 6' (1.829 m)    Weight:    Wt Readings from Last 1 Encounters:  06/18/15 125 lb 14.1 oz (57.1 kg)    Ideal Body Weight:  80.9 kg  BMI:  Body mass index is 17.07 kg/(m^2).  Estimated Nutritional Needs:   Kcal:  1700-2000  Protein:  70-80  Fluid:  >/= 2 L  EDUCATION NEEDS:   No education needs identified at this time  [place], Dietetic Intern Pager: [phone]

## 2015-06-20 ENCOUNTER — Inpatient Hospital Stay (HOSPITAL_COMMUNITY): Payer: Medicare Other

## 2015-06-20 ENCOUNTER — Inpatient Hospital Stay (HOSPITAL_COMMUNITY)
Admit: 2015-06-20 | Discharge: 2015-06-20 | Disposition: A | Discharge status: Home / Self Care | Payer: Medicare Other | Attending: Family Medicine | Admitting: Family Medicine

## 2015-06-20 ENCOUNTER — Encounter (HOSPITAL_COMMUNITY): Payer: Self-pay | Admitting: Registered Nurse

## 2015-06-20 DIAGNOSIS — F05 Delirium due to known physiological condition: Secondary | ICD-10-CM

## 2015-06-20 DIAGNOSIS — R55 Syncope and collapse: Secondary | ICD-10-CM

## 2015-06-20 DIAGNOSIS — F209 Schizophrenia, unspecified: Secondary | ICD-10-CM

## 2015-06-20 LAB — BLOOD GAS, ARTERIAL
ACID-BASE DEFICIT: 5.3 mmol/L — AB (ref 0.0–2.0)
Bicarbonate: 20.2 mEq/L (ref 20.0–24.0)
DRAWN BY: 105551
FIO2: 100
O2 SAT: 95.4 %
Patient temperature: 98.6
pCO2 arterial: 32.2 mmHg — ABNORMAL LOW (ref 35.0–45.0)
pH, Arterial: 7.385 (ref 7.350–7.450)
pO2, Arterial: 84.4 mmHg (ref 80.0–100.0)

## 2015-06-20 LAB — BASIC METABOLIC PANEL
Anion gap: 9 (ref 5–15)
BUN: 40 mg/dL — AB (ref 6–20)
CHLORIDE: 113 mmol/L — AB (ref 101–111)
CO2: 20 mmol/L — AB (ref 22–32)
CREATININE: 1.96 mg/dL — AB (ref 0.61–1.24)
Calcium: 8.5 mg/dL — ABNORMAL LOW (ref 8.9–10.3)
GFR calc Af Amer: 39 mL/min — ABNORMAL LOW (ref >60)
GFR calc non Af Amer: 33 mL/min — ABNORMAL LOW (ref >60)
GLUCOSE: 148 mg/dL — AB (ref 65–99)
POTASSIUM: 4.7 mmol/L (ref 3.5–5.1)
Sodium: 142 mmol/L (ref 135–145)

## 2015-06-20 LAB — CK: CK TOTAL: 363 U/L (ref 49–397)

## 2015-06-20 MED ORDER — FUROSEMIDE 10 MG/ML IJ SOLN
40.0000 mg | Freq: Once | INTRAMUSCULAR | Status: AC
  Administered 2015-06-20: 40 mg via INTRAVENOUS
  Filled 2015-06-20: qty 4

## 2015-06-20 MED ORDER — HALOPERIDOL LACTATE 5 MG/ML IJ SOLN
2.0000 mg | Freq: Once | INTRAMUSCULAR | Status: AC
  Administered 2015-06-20: 2 mg via INTRAVENOUS
  Filled 2015-06-20: qty 1

## 2015-06-20 MED ORDER — QUETIAPINE FUMARATE 25 MG PO TABS
25.0000 mg | ORAL_TABLET | Freq: Two times a day (BID) | ORAL | Status: DC
  Administered 2015-06-20 – 2015-06-24 (×8): 25 mg via ORAL
  Filled 2015-06-20 (×9): qty 1

## 2015-06-20 NOTE — Progress Notes (Signed)
  Echocardiogram 2D Echocardiogram has been performed.  Jared Austin 06/20/2015, 12:40 PM

## 2015-06-20 NOTE — Consult Note (Signed)
  Attempted 2nd Telepsych and no answer.  Spoke with Maureen Ralphs who states that she will get with the nurse of patient to have the machine taken into patients room.  Informed that this was the second attempt to get Telepsych done.  Also informed that will call back to make sure that it was set up.  State that she will do the best she can to get thing set up.    Teo Moede B. Darrah Dredge FNP-BC

## 2015-06-20 NOTE — Progress Notes (Signed)
[id number] Patient continues to speak to persons who are not there, swing arms & fists in the air in a punching motion, swing legs over the bed & attempt to get out of bed without assistance. Ativan has not been effective. MD notified.

## 2015-06-20 NOTE — Progress Notes (Signed)
New order for Haldol  IV x  1 time order given per Dr.David.

## 2015-06-20 NOTE — Consult Note (Signed)
Telepsych Consultation   Reason for Consult:  Hallucinations Referring Physician:  MD Patient Identification: Jared Austin MRN:  [id number] Principal Diagnosis: Acute confusional state Diagnosis:   Patient Active Problem List   Diagnosis Date Noted  . Acute confusional state [F05] 06/20/2015  . Elevated CK [R74.8] 06/19/2015  . Acute encephalopathy [G93.40] 06/19/2015  . Dehydration [E86.0] 06/17/2015  . AKI (acute kidney injury) ([place]) [N17.9] 06/17/2015  . Acute coronary syndrome ([place]) [I24.9]   . Acute respiratory failure with hypercapnia ([place]) [J96.02]   . Acute pulmonary edema (H[facility]) [J81.0]   . COPD exacerbation ([place]) [J44.1]   . Tobacco abuse [Z72.0]   . Chronic diastolic CHF (congestive heart failure) ([place]) [I50.32]   . Pulmonary hypertension ([place]) [I27.2]   . Acute kidney injury ([place]) [N17.9]   . Essential hypertension [I10]   . Schizophrenia ([place]) [F20.9]   . Anxiety state [F41.1]   . Pulmonary edema [J81.1]   . HCAP (healthcare-associated pneumonia) [J18.9]   . CAP (community acquired pneumonia) [J18.9] 02/01/2015  . Acute respiratory failure with hypoxia ([place]) [J96.01] 02/01/2015  . Elevated troponin [R79.89] 02/01/2015  . Acute kidney failure ([place]) [N17.9] 02/01/2015    Total Time spent with patient: 20 minutes  Subjective:   Jared Austin is a 70 y.o. male patient admitted to [facility] after bing found on the ground; acute kidney injury, leucocytosis, and confusion.    HPI:  Jared Austin 70 yr old black male.  Attempted to interview patient but unable to understand what the patient was saying related voice , and patient was rambling, and incoherent speech.  Patient does have some confusions and but is oriented to self was able to give name, date of birth and his age.   Once patient is more medically stable can attempt to do another psych consult.    Past Psychiatric History: Unable to get at this time.   Risk to Self: Suicidal Ideation:  No Suicidal Intent: No Is patient at risk for suicide?: No Suicidal Plan?: No Access to Means:  (NA) What has been your use of drugs/alcohol within the last 12 months?: Pt denies substance use How many times?: 0 Other Self Harm Risks: None Reported Intentional Self Injurious Behavior: None Risk to Others: Homicidal Ideation: No Thoughts of Harm to Others: No Current Homicidal Intent: No Current Homicidal Plan: No Access to Homicidal Means: No History of harm to others?:  (UTA due to pt mental status) Assessment of Violence: None Noted Does patient have access to weapons?: Yes (Comment) (Knives, pt showed writer possible knife in his pocket) Criminal Charges Pending?:  (UTA due to pt mental status) Does patient have a court date:  (UTA due to pt mental status) Prior Inpatient Therapy: Prior Inpatient Therapy: No Prior Outpatient Therapy: Prior Outpatient Therapy: No Does patient have an ACCT team?: No Does patient have Intensive In-House Services?  : No Does patient have [facility] services? : No Does patient have P4CC services?: No  Past Medical History:  Past Medical History  Diagnosis Date  . Anxiety   . Schizophrenia ([place])   . Pneumonia   . Hypertension     Past Surgical History  Procedure Laterality Date  . Cardiac catheterization N/A 02/10/2015    Procedure: Left Heart Cath and Coronary Angiography;  Surgeon: Dixie Dials, MD;  Location: [facility];  Service: Cardiovascular;  Laterality: N/A;   Family History: History reviewed. No pertinent family history. Family Psychiatric  History: Unable to get at this time Social History:  History  Alcohol Use No     History  Drug Use No    Social History   Social History  . Marital Status: Single    Spouse Name: N/A  . Number of Children: N/A  . Years of Education: N/A   Social History Main Topics  . Smoking status: Current Every Day Smoker -- 1.50 packs/day for 62 years    Types: Cigarettes  . Smokeless  tobacco: None  . Alcohol Use: No  . Drug Use: No  . Sexual Activity: Not Asked   Other Topics Concern  . None   Social History Narrative   Additional Social History:    Allergies:   Allergies  Allergen Reactions  . Penicillins Anxiety    Labs:  Results for orders placed or performed during the hospital encounter of 06/17/15 (from the past 48 hour(s))  Basic metabolic panel     Status: Abnormal   Collection Time: 06/19/15  6:36 AM  Result Value Ref Range   Sodium 140 135 - 145 mmol/L   Potassium 4.5 3.5 - 5.1 mmol/L   Chloride 108 101 - 111 mmol/L   CO2 21 (L) 22 - 32 mmol/L   Glucose, Bld 78 65 - 99 mg/dL   BUN 46 (H) 6 - 20 mg/dL   Creatinine, Ser 2.76 (H) 0.61 - 1.24 mg/dL    Comment: DELTA CHECK NOTED   Calcium 8.7 (L) 8.9 - 10.3 mg/dL   GFR calc non Af Amer 22 (L) >60 mL/min   GFR calc Af Amer 26 (L) >60 mL/min    Comment: (NOTE) The eGFR has been calculated using the CKD EPI equation. This calculation has not been validated in all clinical situations. eGFR's persistently <60 mL/min signify possible Chronic Kidney Disease.    Anion gap 11 5 - 15  CK     Status: Abnormal   Collection Time: 06/19/15  6:36 AM  Result Value Ref Range   Total CK 741 (H) 49 - 397 U/L  CBC     Status: Abnormal   Collection Time: 06/19/15  6:36 AM  Result Value Ref Range   WBC 13.7 (H) 4.0 - 10.5 K/uL   RBC 3.50 (L) 4.22 - 5.81 MIL/uL   Hemoglobin 10.7 (L) 13.0 - 17.0 g/dL   HCT 31.8 (L) 39.0 - 52.0 %   MCV 90.9 78.0 - 100.0 fL   MCH 30.6 26.0 - 34.0 pg   MCHC 33.6 30.0 - 36.0 g/dL   RDW 13.2 11.5 - 15.5 %   Platelets 315 150 - 400 K/uL  Blood gas, arterial     Status: Abnormal   Collection Time: 06/19/15  7:50 PM  Result Value Ref Range   O2 Content 4.0 L/min   Delivery systems NASAL CANNULA    pH, Arterial 7.372 7.350 - 7.450   pCO2 arterial 30.5 (L) 35.0 - 45.0 mmHg   pO2, Arterial 51.2 (L) 80.0 - 100.0 mmHg   Bicarbonate 18.8 (L) 20.0 - 24.0 mEq/L   TCO2 11.9 0 - 100  mmol/L   Acid-Base Excess 6.9 (H) 0.0 - 2.0 mmol/L   O2 Saturation 82.4 %   Patient temperature 37.0    Collection site RIGHT BRACHIAL    Drawn by [id number]    Sample type ARTERIAL    Allens test (pass/fail) NOT INDICATED (A) PASS  Basic metabolic panel     Status: Abnormal   Collection Time: 06/20/15  7:25 AM  Result Value Ref Range   Sodium 142 135 -  145 mmol/L   Potassium 4.7 3.5 - 5.1 mmol/L   Chloride 113 (H) 101 - 111 mmol/L   CO2 20 (L) 22 - 32 mmol/L   Glucose, Bld 148 (H) 65 - 99 mg/dL   BUN 40 (H) 6 - 20 mg/dL   Creatinine, Ser 1.96 (H) 0.61 - 1.24 mg/dL   Calcium 8.5 (L) 8.9 - 10.3 mg/dL   GFR calc non Af Amer 33 (L) >60 mL/min   GFR calc Af Amer 39 (L) >60 mL/min    Comment: (NOTE) The eGFR has been calculated using the CKD EPI equation. This calculation has not been validated in all clinical situations. eGFR's persistently <60 mL/min signify possible Chronic Kidney Disease.    Anion gap 9 5 - 15  CK     Status: None   Collection Time: 06/20/15  7:25 AM  Result Value Ref Range   Total CK 363 49 - 397 U/L    Current Facility-Administered Medications  Medication Dose Route Frequency Provider Last Rate Last Dose  . 0.9 %  sodium chloride infusion   Intravenous Continuous Samuella Cota, MD 150 mL/hr at 06/20/15 0413    . acetaminophen (TYLENOL) tablet 650 mg  650 mg Oral Q6H PRN Samuella Cota, MD   650 mg at 06/18/15 1724  . amantadine (SYMMETREL) capsule 100 mg  100 mg Oral TID Samuella Cota, MD   100 mg at 06/20/15 1100  . antiseptic oral rinse (CPC / CETYLPYRIDINIUM CHLORIDE 0.05%) solution 7 mL  7 mL Mouth Rinse BID Rosita Fire, MD   7 mL at 06/20/15 1000  . aspirin EC tablet 81 mg  81 mg Oral Daily Oswald Hillock, MD   81 mg at 06/20/15 1100  . atorvastatin (LIPITOR) tablet 10 mg  10 mg Oral Daily Oswald Hillock, MD   10 mg at 06/20/15 1100  . enoxaparin (LOVENOX) injection 30 mg  30 mg Subcutaneous Q24H Oswald Hillock, MD   30 mg at 06/20/15 0049  .  feeding supplement (BOOST / RESOURCE BREEZE) liquid 1 Container  1 Container Oral TID BM Jeneen Rinks, RD   1 Container at 06/19/15 2041  . folic acid (FOLVITE) tablet 1 mg  1 mg Oral Daily Oswald Hillock, MD   1 mg at 06/20/15 1100  . isosorbide mononitrate (IMDUR) 24 hr tablet 30 mg  30 mg Oral Daily Oswald Hillock, MD   30 mg at 06/20/15 1100  . LORazepam (ATIVAN) injection 0.5 mg  0.5 mg Intravenous Q4H PRN Samuella Cota, MD   0.5 mg at 06/20/15 0413  . metoprolol (LOPRESSOR) tablet 50 mg  50 mg Oral BID Oswald Hillock, MD   50 mg at 06/20/15 1100  . multivitamin with minerals tablet 1 tablet  1 tablet Oral Daily Oswald Hillock, MD   1 tablet at 06/20/15 1100  . nicotine (NICODERM CQ - dosed in mg/24 hours) patch 21 mg  21 mg Transdermal Daily Oswald Hillock, MD   21 mg at 06/20/15 1059  . ondansetron (ZOFRAN) tablet 4 mg  4 mg Oral Q6H PRN Oswald Hillock, MD       Or  . ondansetron (ZOFRAN) injection 4 mg  4 mg Intravenous Q6H PRN Oswald Hillock, MD   4 mg at 06/20/15 0420  . QUEtiapine (SEROQUEL) tablet 25 mg  25 mg Oral BID Shuvon B Rankin, NP      . thiamine (VITAMIN B-1) tablet 100 mg  100 mg Oral Daily Oswald Hillock, MD   100 mg at 06/20/15 1100    Musculoskeletal: Strength & Muscle Tone: Unable to determine at this time Gait & Station: Did not see patient ambulate Patient leans: N/A  Psychiatric Specialty Exam:  Unable to completed Psychiatric Exam related to patient medical condition.  Once patient is more medical stable and coherent can attempt redo psychiatric exam.   Review of Systems  Unable to perform ROS: other  Eyes: Positive for blurred vision.  Psychiatric/Behavioral: Depression: Denies.    Blood pressure 147/72, pulse 83, temperature 97.7 F (36.5 C), temperature source Oral, resp. rate 18, height 6' (1.829 m), weight 57.1 kg (125 lb 14.1 oz), SpO2 85 %.Body mass index is 17.07 kg/(m^2).  General Appearance: Disheveled  Eye Sport and exercise psychologist::  Fair  Speech:  Garbled and Slow   Volume:  Normal  Treatment Plan Summary: Plan Further medical evaluation needs to be done to rule out if confusion or psychosis is related to medical condition.  Once patient is medically stable if need a psychiatric exam can be reordered.  Consulted with Dr. Dwyane Dee and suggested:   Stop Haldol related to possible causing more confusion May start Seroquel 25 mg Bid to help with agitation, irritability, and psychosis.    Disposition: Further medical evaluation needs to be done to rule out if confusion or psychosis is related to medical condition.   Can reorder a psychiatric exam once the patient is medically stable and more coherent.  Unable to perform complete exam at this time related to patients condition.    Rankin, Shuvon, NP 06/20/2015 4:05 PM

## 2015-06-20 NOTE — Consult Note (Signed)
  Attempted Telepsych x 2 no one answered.  Called spoke with secretary on 3 floor and states that she is unable to locate patient.  States that she will locate nurse and have things set up.  Will attempt at later time today.    Eziah Negro B. Brittani Purdum FNP-BC

## 2015-06-20 NOTE — Progress Notes (Signed)
EEG Completed; Results Pending  

## 2015-06-20 NOTE — Progress Notes (Addendum)
PROGRESS NOTE  Jared Austin ZOX:[id number] DOB: [birth year] DOA: 06/17/2015 PCP: Avon Gully, MD  Summary: 70 year old male with history of schizophrenia was brought to the emergency department after being found on the ground by his brother. Initial evaluation revealed acute kidney injury, leukocytosis and acute confusional state.  Assessment/Plan: 1. AKI superimposed on CKD stage 3. Resolving rapidly with IV fluids. Renal ultrasound unremarkable. Suspect secondary to poor oral intake. 2. Fall at home while "chasing a bird". Per discussion with his brother the patient was talking to himself and appeared to be having hallucinations at home. No sequela noted. Echocardiogram reassuring. Head CT nonacute. Exam nonfocal. 3. Acute encephalopathy. Presumably secondary to decompensated schizophrenia. Clinically much improved at this point. 4. Schizophrenia, decompensated, now much improved. Alert and oriented. Reportedly on injectable medication Invega as an outpatient. Last dose unknown 5. Hyperkalemia, resolved. Secondary to acute kidney injury. 6. Rib fracture, secondary to fall. No evidence of complicating features. 7. Chronic hypoxic respiratory failure according to chart review, appears stable. 8. Anxiety/ insomnia, excessive caffeine intake by history 9. Anemia, possible related to hemodilution. Hgb stable.    Overall significantly improved. Alert and oriented, does not appear agitated. Participates in exam  Psychiatry evaluation noted. CT head negative, ABG unremarkable, renal function markedly improved and would not explain symptoms. No evidence of infection. Neurologic exam nonfocal. As noted above, the patient's brother noted confusion, hallucinations and incoherent speech at home. He is treated with Invega chronically. Feel that his current issues are related to his underlying mental illness. Will solicit psychiatry reevaluation tomorrow 2/18 unless continues to improve spontaneously.  Agree with Seroquel.  Follow-up EEG  Anticipate discharge within 24-48 hours.   Code Status: Full DVT prophylaxis: Lovenox Family Communication: Family at bedside. Disposition Plan: Anticipate discharge within 1-2 days.   [name], MD  [facility]  Pager [phone] If 7PM-7AM, please contact night-coverage at [email or web address], password [facility] 06/20/2015, 7:32 AM  LOS: 3 days   Consultants:  none  Procedures:  none  Antibiotics:  none  HPI/Subjective: Feels okay. Denies any nausea, vomiting, or pain. Per sitter, did not eat breakfast this morning.   Patient states he remembers falling the other day after chasing a bird.   Objective: Filed Vitals:   06/19/15 1720 06/19/15 1819 06/19/15 2230 06/20/15 0645  BP: 159/77  144/72 165/70  Pulse: 97  97 100  Temp: 97.7 F (36.5 C)  98.1 F (36.7 C) 99.1 F (37.3 C)  TempSrc: Oral  Axillary Oral  Resp: Height:      Weight:      SpO2: 74% 81% 94% 100%    Intake/Output Summary (Last 24 hours) at 06/20/15 0732 Last data filed at 06/19/15 1832  Gross per 24 hour  Intake   1970 ml  Output      0 ml  Net   1970 ml     Filed Weights   06/18/15 0012  Weight: 57.1 kg (125 lb 14.1 oz)    Exam:    Afebrile, VSS, not hypoxic General:  Appears calm and comfortable Cardiovascular: RRR, no m/r/g. No LE edema. Respiratory: CTA bilaterally, no w/r/r. Normal respiratory effort. Abdomen: soft, ntnd Musculoskeletal: grossly normal tone BUE/BLE. Psychiatric: grossly normal mood and affect, speech fluent and appropriate. Follows commands. Oriented to self, location, and year.  Neurologic: grossly non-focal.  New data reviewed:  Creatinine improved to 1.96, BUN 40  CK down to 363  ABG 7.372/30.5/51.2  CT head negative.   Pertinent data since admission:  DG chest revealed acute Fx of 10th rib  Pending data:  none  Scheduled Meds: . amantadine  100 mg Oral TID  . antiseptic oral rinse  7 mL  Mouth Rinse BID  . aspirin EC  81 mg Oral Daily  . atorvastatin  10 mg Oral Daily  . enoxaparin (LOVENOX) injection  30 mg Subcutaneous Q24H  . feeding supplement  1 Container Oral TID BM  . folic acid  1 mg Oral Daily  . isosorbide mononitrate  30 mg Oral Daily  . metoprolol  50 mg Oral BID  . multivitamin with minerals  1 tablet Oral Daily  . nicotine  21 mg Transdermal Daily  . thiamine  100 mg Oral Daily   Continuous Infusions: . sodium chloride 150 mL/hr at 06/20/15 0413    Principal Problem:   AKI (acute kidney injury) (HCC) Active Problems:   Essential hypertension   Schizophrenia (HCC)   Anxiety state   Dehydration   Elevated CK   Acute encephalopathy   Time spent: 25 minutes   By signing my name below, I, Burnett Harry attest that this documentation has been prepared under the direction and in the presence of [name], MD Electronically signed: Burnett Harry, Scribe. 06/20/2015 10:57am  I personally performed the services described in this documentation. All medical record entries made by the scribe were at my direction. I have reviewed the chart and agree that the record reflects my personal performance and is accurate and complete. [name], MD

## 2015-06-20 NOTE — Procedures (Signed)
  [facility] Katrell Milhorn A. Gerilyn Pilgrim, MD     [email or web address]           HISTORY: The patient is a 70 year old who presents with unresponsiveness and confusion. This study being done to evaluate for seizure as a cause for these episodes.  MEDICATIONS: Scheduled Meds: Continuous Infusions: PRN Meds:.  Prior to Admission medications   Medication Sig Start Date End Date Taking? Authorizing Provider  amantadine (SYMMETREL) 100 MG capsule Take 100 mg by mouth 3 (three) times daily. 01/21/15   Historical Provider, MD  Aspirin-Salicylamide-Caffeine (BC HEADACHE POWDER PO) Take 1 packet by mouth daily as needed (pain).    Historical Provider, MD  lisinopril (PRINIVIL,ZESTRIL) 10 MG tablet Take 10 mg by mouth daily.    Historical Provider, MD      ANALYSIS: A 16 channel recording using standard 10 20 measurements is conducted for 21 minutes. There is a well formed posterior dominant rhythm of 12 Hz which attenuates with eye opening. There is beta activity observed in the frontal areas. Awake and drowsy activities are observed. Photic simulation and hyperventilation are not carried out. There is no focal or lateralized slowing. There is no epileptiform activity observed.   IMPRESSION: This is a normal recording of the awake and drowsy states.      Ayline Dingus A. Gerilyn Pilgrim, M.D.  Diplomate, Biomedical engineer of Psychiatry and Neurology ( Neurology).

## 2015-06-20 NOTE — Progress Notes (Signed)
Nurse aide called nurse to room to state that patient had low oxygen saturations.  Patient was on oxygen at 4liters at the beginning of the shift, placed patient on 6l and patient saturation was still low.  Placed patient on venturi mask, oxygen saturation did not move.  Placed patient on non-rebreather, sats increased to 94%.  Notified mid-level and received an order for a chest x-ray.  Will continue to monitor patient.

## 2015-06-20 NOTE — Care Management Important Message (Signed)
Important Message  Patient Details  Name: Jared Austin MRN: [id number] Date of Birth: 03-09-1946   Medicare Important Message Given:  Yes    Adonis Huguenin, RN 06/20/2015, 11:33 AM

## 2015-06-21 DIAGNOSIS — I5033 Acute on chronic diastolic (congestive) heart failure: Secondary | ICD-10-CM

## 2015-06-21 DIAGNOSIS — J9621 Acute and chronic respiratory failure with hypoxia: Secondary | ICD-10-CM

## 2015-06-21 LAB — BASIC METABOLIC PANEL
ANION GAP: 8 (ref 5–15)
Anion gap: 12 (ref 5–15)
BUN: 39 mg/dL — AB (ref 6–20)
BUN: 40 mg/dL — AB (ref 6–20)
CALCIUM: 8.6 mg/dL — AB (ref 8.9–10.3)
CALCIUM: 8.8 mg/dL — AB (ref 8.9–10.3)
CO2: 20 mmol/L — ABNORMAL LOW (ref 22–32)
CO2: 20 mmol/L — ABNORMAL LOW (ref 22–32)
CREATININE: 2.01 mg/dL — AB (ref 0.61–1.24)
Chloride: 113 mmol/L — ABNORMAL HIGH (ref 101–111)
Chloride: 111 mmol/L (ref 101–111)
Creatinine, Ser: 2.2 mg/dL — ABNORMAL HIGH (ref 0.61–1.24)
GFR calc Af Amer: 38 mL/min — ABNORMAL LOW (ref >60)
GFR calc Af Amer: 34 mL/min — ABNORMAL LOW (ref >60)
GFR calc non Af Amer: 29 mL/min — ABNORMAL LOW (ref >60)
GFR, EST NON AFRICAN AMERICAN: 32 mL/min — AB (ref >60)
GLUCOSE: 181 mg/dL — AB (ref 65–99)
GLUCOSE: 175 mg/dL — AB (ref 65–99)
Potassium: 4.5 mmol/L (ref 3.5–5.1)
Potassium: 4.2 mmol/L (ref 3.5–5.1)
Sodium: 141 mmol/L (ref 135–145)
Sodium: 143 mmol/L (ref 135–145)

## 2015-06-21 LAB — GLUCOSE, CAPILLARY
GLUCOSE-CAPILLARY: 142 mg/dL — AB (ref 65–99)
GLUCOSE-CAPILLARY: 137 mg/dL — AB (ref 65–99)
Glucose-Capillary: 150 mg/dL — ABNORMAL HIGH (ref 65–99)
Glucose-Capillary: 143 mg/dL — ABNORMAL HIGH (ref 65–99)

## 2015-06-21 LAB — CBC
HCT: 30.8 % — ABNORMAL LOW (ref 39.0–52.0)
Hemoglobin: 10 g/dL — ABNORMAL LOW (ref 13.0–17.0)
MCH: 29.9 pg (ref 26.0–34.0)
MCHC: 32.5 g/dL (ref 30.0–36.0)
MCV: 91.9 fL (ref 78.0–100.0)
Platelets: 328 10*3/uL (ref 150–400)
RBC: 3.35 MIL/uL — ABNORMAL LOW (ref 4.22–5.81)
RDW: 13.3 % (ref 11.5–15.5)
WBC: 12.6 10*3/uL — ABNORMAL HIGH (ref 4.0–10.5)

## 2015-06-21 LAB — TROPONIN I
TROPONIN I: 0.09 ng/mL — AB (ref <0.031)
TROPONIN I: 0.1 ng/mL — AB (ref <0.031)
TROPONIN I: 0.1 ng/mL — AB (ref <0.031)
Troponin I: 0.09 ng/mL — ABNORMAL HIGH (ref <0.031)

## 2015-06-21 LAB — C DIFFICILE QUICK SCREEN W PCR REFLEX

## 2015-06-21 LAB — BRAIN NATRIURETIC PEPTIDE: B Natriuretic Peptide: 1182 pg/mL — ABNORMAL HIGH (ref 0.0–100.0)

## 2015-06-21 MED ORDER — FUROSEMIDE 10 MG/ML IJ SOLN
40.0000 mg | Freq: Two times a day (BID) | INTRAMUSCULAR | Status: AC
  Administered 2015-06-21 – 2015-06-22 (×2): 40 mg via INTRAVENOUS
  Filled 2015-06-21 (×2): qty 4

## 2015-06-21 NOTE — Progress Notes (Signed)
Patient seen my hospitalist in building.  Patient had labs and ABG done.  MD decided to transfer patient to high level of care for closer monitoring.  Patient still alert, talking, and oxygen saturations in the 90s.  Report given and patient transferred to ICU.

## 2015-06-21 NOTE — Progress Notes (Signed)
PROGRESS NOTE  Jared Austin ZOX:[id number] DOB: 1945/08/27 DOA: 06/17/2015 PCP: Avon Gully, MD  Summary: 70 year old male with history of schizophrenia was brought to the emergency department after being found on the ground by his brother. Initial evaluation revealed acute kidney injury, leukocytosis and acute confusional state.  Assessment/Plan: 1. Acute on chronic hypoxic respiratory failure. Patient was noted to be hypoxic on his baseline 4L overnight, requiring a nonrebreather mask to attain an O2 sat of 94%. He was moved to the SDU for closer monitoring. CXR shows pulmonary edema. Currently much improved. ABG reassuring. 2. Acute on chronic diastolic CHF. CXR consistent with pulmonary edema. BNP 1182. ECHO as below.  3. Elevated troponin, likely demand ischemia in the setting of pulmonary edema. EKG non-acute thus far troponins flat.  4. AKI superimposed on CKD stage 3. Resolved. Creatinine appears to be at baseline. Renal ultrasound unremarkable. Suspect secondary to poor oral intake. 5. Fall at home while "chasing a bird". Per discussion with his brother the patient was talking to himself and appeared to be having hallucinations at home. No sequela noted. Echocardiogram reassuring. Head CT nonacute. Exam nonfocal. 6. Acute encephalopathy. Presumably secondary to decompensated schizophrenia. Clinically improved at this point. EEG unremarkable.  7. Schizophrenia, decompensated, now much improved. Alert and oriented. Reportedly on injectable medication Invega as an outpatient. Last dose unknown 8. Hyperkalemia, resolved. Secondary to acute kidney injury. 9. Rib fracture, secondary to fall. No evidence of complicating features. 10. Anxiety/ insomnia, excessive caffeine intake by history 11. Anemia, possible related to hemodilution. Hgb stable.    He appears stable, hopefully can wean oxygen today. Will continue IV Lasix. Wean oxygen as tolerated.  Check BMP and CBC in am.   Trend  troponin  Continue Seroquel.   Code Status: Full DVT prophylaxis: Lovenox Family Communication: Family at bedside. Disposition Plan: Continue to monitor in ICU.   [name], MD  [facility]  Pager [phone] If 7PM-7AM, please contact night-coverage at [email or web address], password [facility] 06/21/2015, 6:48 AM  LOS: 4 days   Consultants:  none  Procedures:  ECHO Study Conclusions  - Left ventricle: The cavity size was normal. Wall thickness was increased increased in a pattern of mild to moderate LVH. The estimated ejection fraction was in the range of 55% to 60%. Doppler parameters are consistent with abnormal left ventricular relaxation (grade 1 diastolic dysfunction). - Aortic valve: Valve area (VTI): 1.96 cm^2. Valve area (Vmax): 2.07 cm^2. Valve area (Vmean): 2.1 cm^2. - Mitral valve: Mildly calcified annulus. Mildly thickened leaflets . There was mild regurgitation. - Atrial septum: No defect or patent foramen ovale was identified. - Pulmonary arteries: Systolic pressure was moderately increased. PA peak pressure: 41 mm Hg (S). - Technically difficult study.  Antibiotics:  none  HPI/Subjective: Overnight patient became hypoxic in his baseline 4L [place]. Ultimately he required a non-rebreather mask. CXR was performed that showed pulmonary edema. BNP 1182. He was transferred to the SDU for closer monitoring.   Patient states he feels well. He denies any vomiting overnight. His SOB is improved.    Objective: Filed Vitals:   06/21/15 0100 06/21/15 0200 06/21/15 0300 06/21/15 0400  BP: 151/89  160/95 105/58  Pulse: 77 86 86 89  Temp:    98.7 F (37.1 C)  TempSrc:    Axillary  Resp: 33  Height:      Weight:    58 kg (127 lb 13.9 oz)  SpO2: 91% 92% 95% 93%    Intake/Output Summary (Last 24 hours) at  06/21/15 0648 Last data filed at 06/21/15 0534  Gross per 24 hour  Intake   4480 ml  Output    800 ml  Net   3680 ml     Filed  Weights   06/18/15 0012 06/21/15 0052 06/21/15 0400  Weight: 57.1 kg (125 lb 14.1 oz) 58.5 kg (128 lb 15.5 oz) 58 kg (127 lb 13.9 oz)    Exam:    Afebrile, VSS, not hypoxic General:  Appears comfortable, calm. On nonrebreather mask.  Eyes: PERRL, normal lids, irises ENT: grossly normal hearing, lips, tongue Cardiovascular: Regular rate and rhythm, no murmur, rub or gallop. No lower extremity edema. Telemetry: Sinus rhythm, no arrhythmias  Respiratory: Clear to auscultation bilaterally, no wheezes, rales or rhonchi. Normal respiratory effort. Good air movement. Abdomen: soft, mild upper abdominal pain, nd Musculoskeletal: grossly normal tone bilateral upper and lower extremities Psychiatric: grossly normal mood and affect, speech fluent and appropriate. Oriented to self and [place].  Neurologic: grossly non-focal.  New data reviewed:  ABG 7.38/32/84  CXR - pulmonary edema  BUN 40, Creatinine 2.20 - stable  BNP 1182  Troponin 0.09  CBC with modest leukocytosis  Pertinent data since admission:  DG chest revealed acute Fx of 10th rib  Pending data:  none  Scheduled Meds: . amantadine  100 mg Oral TID  . antiseptic oral rinse  7 mL Mouth Rinse BID  . aspirin EC  81 mg Oral Daily  . atorvastatin  10 mg Oral Daily  . enoxaparin (LOVENOX) injection  30 mg Subcutaneous Q24H  . feeding supplement  1 Container Oral TID BM  . folic acid  1 mg Oral Daily  . isosorbide mononitrate  30 mg Oral Daily  . metoprolol  50 mg Oral BID  . multivitamin with minerals  1 tablet Oral Daily  . nicotine  21 mg Transdermal Daily  . QUEtiapine  25 mg Oral BID  . thiamine  100 mg Oral Daily   Continuous Infusions: . sodium chloride 10 mL/hr at 06/20/15 2236    Principal Problem:   Acute on chronic respiratory failure with hypoxia (HCC) Active Problems:   Essential hypertension   Schizophrenia (HCC)   Anxiety state   AKI (acute kidney injury) (HCC)   Acute encephalopathy    Acute on chronic diastolic CHF (congestive heart failure) (HCC)   Time spent: 30 minutes   By signing my name below, I, Burnett Harry attest that this documentation has been prepared under the direction and in the presence of [name], MD Electronically signed: Burnett Harry, Scribe. 06/21/2015  9:41am  I personally performed the services described in this documentation. All medical record entries made by the scribe were at my direction. I have reviewed the chart and agree that the record reflects my personal performance and is accurate and complete. [name], MD

## 2015-06-22 ENCOUNTER — Inpatient Hospital Stay (HOSPITAL_COMMUNITY): Payer: Medicare Other

## 2015-06-22 LAB — BASIC METABOLIC PANEL
ANION GAP: 12 (ref 5–15)
BUN: 46 mg/dL — ABNORMAL HIGH (ref 6–20)
CALCIUM: 9 mg/dL (ref 8.9–10.3)
CO2: 25 mmol/L (ref 22–32)
CREATININE: 2.39 mg/dL — AB (ref 0.61–1.24)
Chloride: 106 mmol/L (ref 101–111)
GFR, EST AFRICAN AMERICAN: 30 mL/min — AB (ref >60)
GFR, EST NON AFRICAN AMERICAN: 26 mL/min — AB (ref >60)
Glucose, Bld: 98 mg/dL (ref 65–99)
Potassium: 3.6 mmol/L (ref 3.5–5.1)
SODIUM: 143 mmol/L (ref 135–145)

## 2015-06-22 LAB — GLUCOSE, CAPILLARY: GLUCOSE-CAPILLARY: 88 mg/dL (ref 65–99)

## 2015-06-22 MED ORDER — FUROSEMIDE 40 MG PO TABS: 40.0000 mg | ORAL_TABLET | Freq: Once | ORAL | Status: DC

## 2015-06-22 MED ORDER — FUROSEMIDE 10 MG/ML IJ SOLN
40.0000 mg | Freq: Once | INTRAMUSCULAR | Status: AC
  Administered 2015-06-22: 40 mg via INTRAVENOUS
  Filled 2015-06-22: qty 4

## 2015-06-22 NOTE — Progress Notes (Signed)
PROGRESS NOTE  Jared Austin ZOX:[id number] DOB: Jun 10, 1945 DOA: 06/17/2015 PCP: Avon Gully, MD  Summary: 70 year old male with history of schizophrenia was brought to the emergency department after being found on the ground by his brother. Initial evaluation revealed acute kidney injury, leukocytosis and acute confusional state.  Assessment/Plan: 1. Acute on chronic hypoxic respiratory failure. Improved., Now on high flow nasal cannula. Repeat chest x-ray shows clinical improvement with decreased edema. He is afebrile with no signs or symptoms to suggest pneumonia. Clinical improvement correlates with diuresis. 2. Acute on chronic diastolic congestive heart failure. Overall improved. Good diuresis. 3. Demand ischemia secondary to acute on chronic diastolic heart failure. Troponins flat. EKG nonacute. 4. Acute kidney injury superimposed on chronic kidney disease stage III. Acute component resolved. Creatinine at baseline. Renal ultrasound unremarkable. 5. Fall at home while "chasing a bird". Per discussion with his brother the patient was talking to himself and appeared to be having hallucinations at home. No sequela noted. Echocardiogram reassuring. Head CT nonacute. Exam nonfocal. No further evaluation suggested. 6. Acute encephalopathy. Presumably secondary to decompensated schizophrenia. Appears resolved at this point. EEG unremarkable.  7. Schizophrenia, decompensated, now appears a baseline. Reportedly on injectable medication Invega as an outpatient. Last dose unknown 8. Rib fracture, secondary to fall. No evidence of complicating features. 9. Anxiety/ insomnia, excessive caffeine intake by history 10. Anemia, possible related to hemodilution. Hgb stable.    Overall much improved, now on HFNC. Change to oral lasix 2/20  Transfer to medical bed  Wean oxygen as tolerated  Hopefully home next 48 hours  Code Status: Full DVT prophylaxis: Lovenox Family Communication: None at  bedside Disposition Plan:   [name], MD  [facility]  Pager [phone] If 7PM-7AM, please contact night-coverage at [email or web address], password [facility] 06/22/2015, 6:54 AM  LOS: 5 days   Consultants:  none  Procedures:  ECHO Study Conclusions  - Left ventricle: The cavity size was normal. Wall thickness was increased increased in a pattern of mild to moderate LVH. The estimated ejection fraction was in the range of 55% to 60%. Doppler parameters are consistent with abnormal left ventricular relaxation (grade 1 diastolic dysfunction). - Aortic valve: Valve area (VTI): 1.96 cm^2. Valve area (Vmax): 2.07 cm^2. Valve area (Vmean): 2.1 cm^2. - Mitral valve: Mildly calcified annulus. Mildly thickened leaflets . There was mild regurgitation. - Atrial septum: No defect or patent foramen ovale was identified. - Pulmonary arteries: Systolic pressure was moderately increased. PA peak pressure: 41 mm Hg (S). - Technically difficult study.  Antibiotics:  none  HPI/Subjective: Pain on right side, ribs, from fall. Otherwise no complaints.   Sitter reports that he was able to eat without difficulty.   Overall better per RN.  Objective: Filed Vitals:   06/22/15 0400 06/22/15 0500 06/22/15 0509 06/22/15 0600  BP: 153/79  158/75 162/83  Pulse: 87  92 80  Temp: 98.7 F (37.1 C)     TempSrc: Axillary     Resp: Height:      Weight:  55.8 kg (123 lb 0.3 oz)    SpO2: 100% 96% 96% 100%    Intake/Output Summary (Last 24 hours) at 06/22/15 0654 Last data filed at 06/22/15 0645  Gross per 24 hour  Intake    480 ml  Output   2850 ml  Net  -2370 ml     Filed Weights   06/21/15 0052 06/21/15 0400 06/22/15 0500  Weight: 58.5 kg (128 lb 15.5 oz) 58 kg (127 lb 13.9  oz) 55.8 kg (123 lb 0.3 oz)    Exam: Afebrile, VSS, on [place] General:  Appears comfortable, calm.  Cardiovascular: Regular rate and rhythm, no murmur, rub or gallop. No lower extremity  edema. Telemetry: Sinus rhythm, no arrhythmias  Respiratory: Clear to auscultation bilaterally, no wheezes, rales or rhonchi. Normal respiratory effort. Good air movement. Psychiatric: grossly normal mood and affect, speech fluent and appropriate.   New data reviewed:  UOP 2850  CBG stable  BMP without significant change   troponins flat. 09  Repeat chest x-ray 2/19 shows clinical improvement, decreased edema   Scheduled Meds: . amantadine  100 mg Oral TID  . antiseptic oral rinse  7 mL Mouth Rinse BID  . aspirin EC  81 mg Oral Daily  . atorvastatin  10 mg Oral Daily  . enoxaparin (LOVENOX) injection  30 mg Subcutaneous Q24H  . feeding supplement  1 Container Oral TID BM  . folic acid  1 mg Oral Daily  . isosorbide mononitrate  30 mg Oral Daily  . multivitamin with minerals  1 tablet Oral Daily  . nicotine  21 mg Transdermal Daily  . QUEtiapine  25 mg Oral BID  . thiamine  100 mg Oral Daily   Continuous Infusions: . sodium chloride 10 mL/hr at 06/20/15 2236    Principal Problem:   Acute on chronic respiratory failure with hypoxia (HCC) Active Problems:   Essential hypertension   Schizophrenia (HCC)   Anxiety state   AKI (acute kidney injury) (HCC)   Acute encephalopathy   Acute on chronic diastolic CHF (congestive heart failure) (HCC)   Time spent: 20 minutes    By signing my name below, I, Zadie Cleverly attest that this documentation has been prepared under the direction and in the presence of [name], MD Electronically signed: Zadie Cleverly  06/22/2015 9:01am   I personally performed the services described in this documentation. All medical record entries made by the scribe were at my direction. I have reviewed the chart and agree that the record reflects my personal performance and is accurate and complete. [name], MD

## 2015-06-23 MED ORDER — FUROSEMIDE 10 MG/ML IJ SOLN
20.0000 mg | Freq: Once | INTRAMUSCULAR | Status: AC
  Administered 2015-06-23: 20 mg via INTRAVENOUS
  Filled 2015-06-23: qty 2

## 2015-06-23 NOTE — Evaluation (Signed)
Physical Therapy Evaluation Patient Details Name: Jared Austin MRN: [id number] DOB: [birth year] Today's Date: 06/23/2015   History of Present Illness  69yo black male found on floor at home by brother. PMH: GAD, schizophrenia, PNA, HTN, Stg3 KD, CHF. Pt move to step down on 2/17 after respiratory failure. Pt is on O2 chronically at home, but is noncompliant per niece.   Clinical Impression  Pt demonstrating strength and balance at near baseline levels. Moderately to severely impaired activity tolerance, O2 perfusion, and functional mobility, with SOB and knee buckling after 43ft AMB on 15L via HFNC. Pt recovers to 95% resting after 20 seconds. Baseline function includes community distance AMB without AD, and without any falls history. Pt received with O2 doffed as he had just finished brushing teeth with Tech. Pt did not exhibit any SOB at that time on RA, which is concerning given his noncompliance with O2 at home and frequent exacerbation of respiratory status. I discussed the value of regular monitoring of O2 at home and acquisition of pulse oximeter. Pt will benefit from skilled PT services at DC to address the above impairments and limitations, to improve patient safety and restore to PLOF.      Follow Up Recommendations Home health PT    Equipment Recommendations   (recommend family acquire a pulse oximeter. )    Recommendations for Other Services       Precautions / Restrictions Precautions Precautions: None Restrictions Weight Bearing Restrictions: No      Mobility  Bed Mobility Overal bed mobility: Independent                Transfers Overall transfer level: Independent Equipment used: None             General transfer comment: 5x STS in 8.73s  Ambulation/Gait Ambulation/Gait assistance: Min guard Ambulation Distance (Feet): 80 Feet Assistive device: None     Gait velocity interpretation: at or above normal speed for age/gender General Gait Details:  appears safe and confident, but knees begin to buckle at end and pt rushed to bed to sit down to avoid fall. No other LOB noted.   Stairs            Wheelchair Mobility    Modified Rankin (Stroke Patients Only)       Balance Overall balance assessment:  (single fall associated with this visit (walkign [facility] at night); No LOB with  eyes closed narrow stance, forward reach is 12+ inches. )                                           Pertinent Vitals/Pain Pain Assessment: No/denies pain    Home Living Family/patient expects to be discharged to:: Private residence Living Arrangements: Other relatives (brother) Available Help at Discharge: Family;Available PRN/intermittently Type of Home: House Home Access: Stairs to enter   [employer] of Steps: 1 Home Layout: Multi-level;Bed/bath upstairs Home Equipment: Walker - 2 wheels      Prior Function Level of Independence: Independent               Hand Dominance        Extremity/Trunk Assessment   Upper Extremity Assessment: Overall WFL for tasks assessed           Lower Extremity Assessment: Overall WFL for tasks assessed         Communication   Communication: No difficulties  Cognition Arousal/Alertness: Awake/alert Behavior During Therapy: WFL for tasks assessed/performed Overall Cognitive Status: History of cognitive impairments - at baseline       Memory: Decreased recall of precautions              General Comments      Exercises        Assessment/Plan    PT Assessment Patient needs continued PT services  PT Diagnosis Difficulty walking   PT Problem List Decreased activity tolerance;Decreased safety awareness;Decreased knowledge of precautions;Cardiopulmonary status limiting activity  PT Treatment Interventions DME instruction;Gait training;Stair training;Functional mobility training;Therapeutic activities;Therapeutic exercise;Patient/family  education   PT Goals (Current goals can be found in the Care Plan section) Acute Rehab PT Goals Patient Stated Goal: return to home and PLOF  PT Goal Formulation: With patient/family Time For Goal Achievement: 07/07/15 Potential to Achieve Goals: Good    Frequency Min 3X/week   Barriers to discharge Decreased caregiver support;Inaccessible home environment      Co-evaluation               End of Session Equipment Utilized During Treatment: Gait belt;Oxygen Activity Tolerance: Patient tolerated treatment well;Patient limited by fatigue Patient left: in bed;with call bell/phone within reach;with nursing/sitter in room;with family/visitor present Nurse Communication: Mobility status         Time: 1610-9604 PT Time Calculation (min) (ACUTE ONLY): 15 min   Charges:   PT Evaluation $PT Eval Moderate Complexity: 1 Procedure PT Treatments $Therapeutic Activity: 8-22 mins   PT G Codes:        4:36 PM, 04-Jul-2015 Rosamaria Lints, PT, DPT PRN Physical Therapist at [facility] [place] License # [id number] [phone] (wireless)  [phone] (mobile)

## 2015-06-23 NOTE — Progress Notes (Signed)
PROGRESS NOTE  Jared Austin ZOX:[id number] DOB: 1945/11/10 DOA: 06/17/2015 PCP: Avon Gully, MD  Summary: 70 year old male with history of schizophrenia was brought to the emergency department after being found on the ground by his brother. Initial evaluation revealed acute kidney injury, leukocytosis and acute confusional state.  Assessment/Plan: 1. Acute on chronic hypoxic respiratory failure. Continues to improve, stable on high flow nasal cannula. He does have chronic hypoxic respiratory failure on oxygen at home. No evidence of suggest pneumonia. History correlates with CHF. 2. Acute on chronic diastolic congestive heart failure. Continues to improve. 3. Demand ischemia secondary to acute on chronic diastolic heart failure. Troponins flat. EKG nonacute. If further evaluation suggested. 4. Acute kidney injury superimposed on chronic kidney disease stage III. Acute component resolved. Creatinine at baseline. Renal ultrasound unremarkable. 5. Fall at home while "chasing a bird". Per discussion with his brother the patient was talking to himself and appeared to be having hallucinations at home. No sequela noted. Echocardiogram reassuring. Head CT nonacute. Exam nonfocal. No further evaluation suggested. 6. Acute encephalopathy. Presumably secondary to decompensated schizophrenia. Appears to be a baseline. EEG unremarkable.  7. Schizophrenia, decompensated, appears to be a baseline. Reportedly on injectable medication Invega as an outpatient. Last dose unknown 8. Rib fracture, secondary to fall. No evidence of complicating features. 9. Anxiety/ insomnia, excessive caffeine intake by history 10. Anemia, possible related to hemodilution. Hgb stable.    Overall improving.   Another dose of IV lasix. Continue diuresis and wean O2 as tolerated.  Hopefully home tomorrow.  Code Status: Full DVT prophylaxis: Lovenox Family Communication: None at bedside Disposition Plan: Anticipate  discharge next 24 hours.   [name], MD  [facility]  Pager [phone] If 7PM-7AM, please contact night-coverage at [email or web address], password [facility] 06/23/2015, 10:35 AM  LOS: 6 days   Consultants:  Neurology   Procedures:  ECHO Study Conclusions  - Left ventricle: The cavity size was normal. Wall thickness was increased increased in a pattern of mild to moderate LVH. The estimated ejection fraction was in the range of 55% to 60%. Doppler parameters are consistent with abnormal left ventricular relaxation (grade 1 diastolic dysfunction). - Aortic valve: Valve area (VTI): 1.96 cm^2. Valve area (Vmax): 2.07 cm^2. Valve area (Vmean): 2.1 cm^2. - Mitral valve: Mildly calcified annulus. Mildly thickened leaflets . There was mild regurgitation. - Atrial septum: No defect or patent foramen ovale was identified. - Pulmonary arteries: Systolic pressure was moderately increased. PA peak pressure: 41 mm Hg (S). - Technically difficult study.  Antibiotics:  none  HPI/Subjective: Feeling good. No reports of chest pain, shortness of breath, n/v/d, or abd pain.  Objective: Filed Vitals:   06/23/15 8295 06/23/15 0904 06/23/15 0907 06/23/15 0909  BP: 143/78     Pulse: 102     Temp: 98.7 F (37.1 C)     TempSrc: Oral     Resp: 20     Height:      Weight:      SpO2: 98% 99% 85% 97%    Intake/Output Summary (Last 24 hours) at 06/23/15 1035 Last data filed at 06/23/15 0830  Gross per 24 hour  Intake    800 ml  Output    900 ml  Net   -100 ml     Filed Weights   06/21/15 0052 06/21/15 0400 06/22/15 0500  Weight: 58.5 kg (128 lb 15.5 oz) 58 kg (127 lb 13.9 oz) 55.8 kg (123 lb 0.3 oz)    Exam: Afebrile, VSS, on HFNC  General:  Appears comfortable, calm. Cardiovascular: Regular rate and rhythm, no murmur, rub or gallop. No lower extremity edema. Respiratory: Clear to auscultation bilaterally, no wheezes, rales or rhonchi. Normal respiratory effort.  Speaks in full sentences.  Abdomen: soft, ntnd Psychiatric: grossly normal mood and affect, speech fluent and appropriate. Oriented to self, place, year and president. Unaware as to why he was hospitalized.  Neurologic: grossly non-focal.  New data reviewed:  UOP 900  Chest x-ray yesterday showed improvement   Scheduled Meds: . amantadine  100 mg Oral TID  . antiseptic oral rinse  7 mL Mouth Rinse BID  . aspirin EC  81 mg Oral Daily  . atorvastatin  10 mg Oral Daily  . enoxaparin (LOVENOX) injection  30 mg Subcutaneous Q24H  . feeding supplement  1 Container Oral TID BM  . folic acid  1 mg Oral Daily  . isosorbide mononitrate  30 mg Oral Daily  . multivitamin with minerals  1 tablet Oral Daily  . nicotine  21 mg Transdermal Daily  . QUEtiapine  25 mg Oral BID  . thiamine  100 mg Oral Daily   Continuous Infusions: . sodium chloride 10 mL/hr at 06/20/15 2236    Principal Problem:   Acute on chronic respiratory failure with hypoxia (HCC) Active Problems:   Essential hypertension   Schizophrenia (HCC)   Anxiety state   AKI (acute kidney injury) (HCC)   Acute encephalopathy   Acute on chronic diastolic CHF (congestive heart failure) (HCC)   Time spent: 20 minutes    By signing my name below, I, Zadie Cleverly attest that this documentation has been prepared under the direction and in the presence of [name], MD Electronically signed: Zadie Cleverly  06/23/2015 10:41am   I personally performed the services described in this documentation. All medical record entries made by the scribe were at my direction. I have reviewed the chart and agree that the record reflects my personal performance and is accurate and complete. [name], MD

## 2015-06-23 NOTE — Plan of Care (Signed)
Problem: Acute Rehab PT Goals(only PT should resolve) Goal: Pt Will Ambulate Pt will ambulate Supervision for a distance greater than 266ft and SaO2>88% to demonstrate the ability to perform safe household distance ambulation at discharge.    Goal: Pt Will Go Up/Down Stairs Pt will ascend/descend 20 stairs with 1 handrail at Super[place] to demonstrate safe access of living space.

## 2015-06-24 LAB — BASIC METABOLIC PANEL
ANION GAP: 8 (ref 5–15)
BUN: 48 mg/dL — ABNORMAL HIGH (ref 6–20)
CALCIUM: 9.1 mg/dL (ref 8.9–10.3)
CO2: 30 mmol/L (ref 22–32)
Chloride: 101 mmol/L (ref 101–111)
Creatinine, Ser: 1.86 mg/dL — ABNORMAL HIGH (ref 0.61–1.24)
GFR calc non Af Amer: 36 mL/min — ABNORMAL LOW (ref >60)
GFR, EST AFRICAN AMERICAN: 41 mL/min — AB (ref >60)
Glucose, Bld: 101 mg/dL — ABNORMAL HIGH (ref 65–99)
POTASSIUM: 3.5 mmol/L (ref 3.5–5.1)
SODIUM: 139 mmol/L (ref 135–145)

## 2015-06-24 MED ORDER — QUETIAPINE FUMARATE 25 MG PO TABS: 25.0000 mg | ORAL_TABLET | Freq: Two times a day (BID) | ORAL | Status: DC

## 2015-06-24 MED ORDER — ATORVASTATIN CALCIUM 10 MG PO TABS: 10.0000 mg | ORAL_TABLET | Freq: Every day | ORAL | Status: DC

## 2015-06-24 MED ORDER — FOLIC ACID 1 MG PO TABS: 1.0000 mg | ORAL_TABLET | Freq: Every day | ORAL | Status: DC

## 2015-06-24 MED ORDER — ISOSORBIDE MONONITRATE ER 30 MG PO TB24: 30.0000 mg | ORAL_TABLET | Freq: Every day | ORAL | Status: DC

## 2015-06-24 MED ORDER — ADULT MULTIVITAMIN W/MINERALS CH: 1.0000 | ORAL_TABLET | Freq: Every day | ORAL | Status: DC

## 2015-06-24 MED ORDER — THIAMINE HCL 100 MG PO TABS: 100.0000 mg | ORAL_TABLET | Freq: Every day | ORAL | Status: DC

## 2015-06-24 MED ORDER — ASPIRIN EC 81 MG PO TBEC: 81.0000 mg | DELAYED_RELEASE_TABLET | Freq: Every day | ORAL | Status: DC

## 2015-06-24 NOTE — Progress Notes (Signed)
PROGRESS NOTE  Jared Austin WUJ:[id number] DOB: [birth year] DOA: 06/17/2015 PCP: Avon Gully, MD  Summary: 70 year old male with history of schizophrenia was brought to the emergency department after being found on the ground by his brother. Initial evaluation revealed acute kidney injury, leukocytosis and acute confusional state. History with IV fluids with rapid improvement of renal function. Elevated CK improved as well. 2-D echocardiogram was unremarkable and there is no history to definitively suggest syncope. Telemetry is unremarkable. He is seen by psychiatry and started on quetiapine. Mental status improved to baseline spontaneously. He says when he developed acute hypoxic respiratory failure secondary to acute diastolic heart failure and was transferred to stepdown unit. He improved rapidly with Lasix and at this time appears stable and nasal cannula. He is approaching discharge.  Assessment/Plan: 1. Acute on chronic hypoxic respiratory failure. Continues to improve, stable on [place] 3L. He does have chronic hypoxic respiratory failure on oxygen at home. No evidence of suggest pneumonia. History correlates with CHF. 2. Acute on chronic diastolic congestive heart failure. Continues to improve. Appears compensated. No evidence of overload.  3. Demand ischemia secondary to acute on chronic diastolic heart failure. Troponins flat. EKG nonacute. No further evaluation suggested. 4. Acute kidney injury superimposed on chronic kidney disease stage III. Acute component resolved. Creatinine at baseline. Renal ultrasound unremarkable. 5. Fall at home while "chasing a bird". Per discussion with his brother the patient was talking to himself and appeared to be having hallucinations at home. No sequela noted. Echocardiogram reassuring. Head CT nonacute. Exam nonfocal. No further evaluation suggested. 6. Acute encephalopathy. Presumably secondary to decompensated schizophrenia. Appears to be a baseline. EEG  unremarkable.  7. Schizophrenia, decompensated, appears to be a baseline. Reportedly on injectable medication Invega as an outpatient. Last dose unknown 8. Rib fracture, secondary to fall. No evidence of complicating features. 9. Anxiety/ insomnia, excessive caffeine intake by history 10. Anemia, possible related to hemodilution. Hgb stable.    Overall improving. Encephalopathy resolved.   Now on [place] 3L  Will talk to family, likely home later today on home oxygen  Code Status: Full DVT prophylaxis: Lovenox Family Communication: None at bedside Disposition Plan: Anticipate discharge later today.    [name], MD  [facility]  Pager [phone] If 7PM-7AM, please contact night-coverage at [email or web address], password [facility] 06/24/2015, 7:21 AM  LOS: 7 days   Consultants:  Neurology   Procedures:  ECHO Study Conclusions  - Left ventricle: The cavity size was normal. Wall thickness was increased increased in a pattern of mild to moderate LVH. The estimated ejection fraction was in the range of 55% to 60%. Doppler parameters are consistent with abnormal left ventricular relaxation (grade 1 diastolic dysfunction). - Aortic valve: Valve area (VTI): 1.96 cm^2. Valve area (Vmax): 2.07 cm^2. Valve area (Vmean): 2.1 cm^2. - Mitral valve: Mildly calcified annulus. Mildly thickened leaflets . There was mild regurgitation. - Atrial septum: No defect or patent foramen ovale was identified. - Pulmonary arteries: Systolic pressure was moderately increased. PA peak pressure: 41 mm Hg (S). - Technically difficult study.  Antibiotics:  none  HPI/Subjective: Feels good. Has mild congestion but otherwise no other complaints of pain, or SOB.   Objective: Filed Vitals:   06/23/15 1623 06/23/15 2140 06/24/15 0214 06/24/15 0545  BP:  117/48  149/64  Pulse: 115 90  101  Temp:  98.7 F (37.1 C)  98.4 F (36.9 C)  TempSrc:  Oral  Oral  Resp:  20  20  Height:  Weight:      SpO2: 95% 100% 98% 95%    Intake/Output Summary (Last 24 hours) at 06/24/15 0721 Last data filed at 06/23/15 1704  Gross per 24 hour  Intake    800 ml  Output      0 ml  Net    800 ml     Filed Weights   06/21/15 0052 06/21/15 0400 06/22/15 0500  Weight: 58.5 kg (128 lb 15.5 oz) 58 kg (127 lb 13.9 oz) 55.8 kg (123 lb 0.3 oz)    Exam: Afebrile, VSS, SpO2 adequate on 3 L [place] General:  Appears calm and comfortable Cardiovascular: RRR, no m/r/g. No LE edema. Respiratory: CTA bilaterally, no w/r/r. Normal respiratory effort. Psychiatric: grossly normal mood and affect, speech fluent and appropriate. Oriented to name, place, month and year  New data reviewed:  Basic metabolic panel unremarkable, creatinine baseline 1.86.   Scheduled Meds: . amantadine  100 mg Oral TID  . antiseptic oral rinse  7 mL Mouth Rinse BID  . aspirin EC  81 mg Oral Daily  . atorvastatin  10 mg Oral Daily  . enoxaparin (LOVENOX) injection  30 mg Subcutaneous Q24H  . feeding supplement  1 Container Oral TID BM  . folic acid  1 mg Oral Daily  . isosorbide mononitrate  30 mg Oral Daily  . multivitamin with minerals  1 tablet Oral Daily  . nicotine  21 mg Transdermal Daily  . QUEtiapine  25 mg Oral BID  . thiamine  100 mg Oral Daily   Continuous Infusions: . sodium chloride 10 mL/hr at 06/20/15 2236    Principal Problem:   Acute on chronic respiratory failure with hypoxia (HCC) Active Problems:   Essential hypertension   Schizophrenia (HCC)   Anxiety state   AKI (acute kidney injury) (HCC)   Acute encephalopathy   Acute on chronic diastolic CHF (congestive heart failure) (HCC)       By signing my name below, I, Zadie Cleverly attest that this documentation has been prepared under the direction and in the presence of [name], MD Electronically signed: Zadie Cleverly  06/24/2015 9:01am   I personally performed the services described in this documentation. All  medical record entries made by the scribe were at my direction. I have reviewed the chart and agree that the record reflects my personal performance and is accurate and complete. [name], MD

## 2015-06-24 NOTE — Discharge Summary (Signed)
Physician Discharge Summary  Jared Austin WUJ:[id number] DOB: 02-14-1946 DOA: 06/17/2015  PCP: No primary care provider on file.  Admit date: 06/17/2015 Discharge date: 06/24/2015  Recommendations for Outpatient Follow-up:  1. Follow-up chronic medical conditions with primary care physician including diastolic heart failure and chronic hypoxic respiratory failure 2. Follow up schizophrenia, patient started on Seroquel.    Follow-up Information    Follow up with [facility] On 06/27/2015.   Why:  2/24 Friday morning at 8 am; Call [facility] to set up transportation      Follow up with [facility] On 07/03/2015.   Why:  March 2 at 10 am with Dr. Molli Hazard information:   [address] [place] [address] [phone]      Discharge Diagnoses:  1. Acute on chronic hypoxic respiratory failure. On 3 L nasal cannula at home. 2. Acute on chronic diastolic congestive heart failure.  3. Demand ischemia secondary to acute on chronic diastolic heart failure.  4. Acute kidney injury superimposed on chronic kidney disease stage III. Marland Kitchen 5. Fall with rib fracture 6. Acute encephalopathy. Secondary to schizophrenia 7. Schizophrenia. 8. Rib fracture, secondary to fall.  9. Anxiety/ insomnia. 10. Anemia.  Discharge Condition: Improved  Disposition: Home with HHPT  Diet recommendation: Heart healthy   Filed Weights   06/21/15 0052 06/21/15 0400 06/22/15 0500  Weight: 58.5 kg (128 lb 15.5 oz) 58 kg (127 lb 13.9 oz) 55.8 kg (123 lb 0.3 oz)    History of present illness:  70 year old male with history of schizophrenia was brought to the emergency department after being found on the ground by his brother. Initial evaluation revealed acute kidney injury, leukocytosis and acute confusional state.  Hospital Course:  He was treated with IV fluids with rapid improvement of renal function. Elevated CK improved as well. 2-D echocardiogram was unremarkable and there was  no history to definitively suggest syncope. Telemetry was unremarkable. He was seen by psychiatry and started on quetiapine. Mental status improved to baseline spontaneously. He then developed acute hypoxic respiratory failure secondary to acute diastolic heart failure and was transferred to stepdown unit. He improved rapidly with Lasix and at this time appears stable on chronic nasal cannula. Individual issues as below  Individual issues as below:  1. Acute on chronic hypoxic respiratory failure. Continues to improve, stable on [place] 3L. He does have chronic hypoxic respiratory failure on oxygen at home. No evidence of suggest pneumonia. History correlates with CHF. 2. Acute on chronic diastolic congestive heart failure. Continues to improve. Appears compensated. No evidence of overload.  3. Demand ischemia secondary to acute on chronic diastolic heart failure. Troponins flat. EKG nonacute. No further evaluation suggested. 4. Acute kidney injury superimposed on chronic kidney disease stage III. Acute component resolved. Creatinine at baseline. Renal ultrasound unremarkable. 5. Fall at home while "chasing a bird". Per discussion with his brother the patient was talking to himself and appeared to be having hallucinations at home. No sequela noted. Echocardiogram reassuring. Head CT nonacute. Exam nonfocal. No further evaluation suggested. 6. Acute encephalopathy. Presumably secondary to decompensated schizophrenia. Appears to be a baseline. EEG unremarkable.  7. Schizophrenia, decompensated, appears to be a baseline. Reportedly on injectable medication Invega as an outpatient. Last dose unknown 8. Rib fracture, secondary to fall. No evidence of complicating features. 9. Anxiety/ insomnia, excessive caffeine intake by history 10. Anemia, possible related to hemodilution. Hgb stable.  Consultants:  Psychiatry  Procedures:  ECHO Study Conclusions  - Left  ventricle: The cavity size was normal. Wall  thickness was increased increased in a pattern of mild to moderate LVH. The estimated ejection fraction was in the range of 55% to 60%. Doppler parameters are consistent with abnormal left ventricular relaxation (grade 1 diastolic dysfunction). - Aortic valve: Valve area (VTI): 1.96 cm^2. Valve area (Vmax): 2.07 cm^2. Valve area (Vmean): 2.1 cm^2. - Mitral valve: Mildly calcified annulus. Mildly thickened leaflets . There was mild regurgitation. - Atrial septum: No defect or patent foramen ovale was identified. - Pulmonary arteries: Systolic pressure was moderately increased. PA peak pressure: 41 mm Hg (S). - Technically difficult study.  Discharge Instructions  Discharge Instructions    Diet - low sodium heart healthy    Complete by:  As directed      Discharge instructions    Complete by:  As directed   Call your physician or seek immediate medical attention for confusion, falls, pain, fever, shortness of breath or worsening of condition.  Please see your primary care doctor in 1 week. Please see your mental health professional in 1 week.     Increase activity slowly    Complete by:  As directed           Discharge Medication List as of 06/24/2015  3:06 PM    START taking these medications   Details  Multiple Vitamin (MULTIVITAMIN WITH MINERALS) TABS tablet Take 1 tablet by mouth daily., Starting 06/24/2015, Until Discontinued, No Print    QUEtiapine (SEROQUEL) 25 MG tablet Take 1 tablet (25 mg total) by mouth 2 (two) times daily., Starting 06/24/2015, Until Discontinued, Print      CONTINUE these medications which have CHANGED   Details  aspirin EC 81 MG tablet Take 1 tablet (81 mg total) by mouth daily., Starting 06/24/2015, Until Discontinued, No Print    atorvastatin (LIPITOR) 10 MG tablet Take 1 tablet (10 mg total) by mouth daily., Starting 06/24/2015, Until Discontinued, No Print    folic acid (FOLVITE) 1 MG tablet Take 1 tablet (1 mg total) by mouth  daily., Starting 06/24/2015, Until Discontinued, No Print    isosorbide mononitrate (IMDUR) 30 MG 24 hr tablet Take 1 tablet (30 mg total) by mouth daily., Starting 06/24/2015, Until Discontinued, No Print    thiamine 100 MG tablet Take 1 tablet (100 mg total) by mouth daily., Starting 06/24/2015, Until Discontinued, No Print      CONTINUE these medications which have NOT CHANGED   Details  amantadine (SYMMETREL) 100 MG capsule Take 100 mg by mouth 3 (three) times daily., Starting 01/21/2015, Until Discontinued, Historical Med      STOP taking these medications     Aspirin-Salicylamide-Caffeine (BC HEADACHE POWDER PO)      lisinopril (PRINIVIL,ZESTRIL) 10 MG tablet      nicotine (NICODERM CQ - DOSED IN MG/24 HOURS) 21 mg/24hr patch        Allergies  Allergen Reactions  . Penicillins Anxiety    The results of significant diagnostics from this hospitalization (including imaging, microbiology, ancillary and laboratory) are listed below for reference.    Significant Diagnostic Studies: Dg Chest 2 View  06/17/2015  CLINICAL DATA:  Confusion yesterday and this evening. Syncope. Elevated white cell count. EXAM: CHEST  2 VIEW COMPARISON:  02/05/2015 FINDINGS: Normal heart size and pulmonary vascularity. Mild diffuse interstitial pattern to the lungs is nonspecific but unchanged since previous study, suggesting possible chronic process. No focal airspace disease or consolidation. No blunting of costophrenic angles. No pneumothorax. There is an acute appearing  fracture of the anterior right tenth rib. IMPRESSION: Diffuse interstitial pattern to the lungs appears chronic. Acute appearing right tenth rib fracture. Electronically Signed   By: Burman Nieves M.D.   On: 06/17/2015 22:24   Ct Head Wo Contrast  06/19/2015  CLINICAL DATA:  Fall at home with syncope.  Confusion. EXAM: CT HEAD WITHOUT CONTRAST TECHNIQUE: Contiguous axial images were obtained from the base of the skull through the vertex  without intravenous contrast. COMPARISON:  None. FINDINGS: Motion at the vertex obscuring a small portion of the brain. Skull and Sinuses:Negative for fracture or destructive process. The visualized mastoids, middle ears, and imaged paranasal sinuses are clear. Visualized orbits: Negative. Brain: No evidence of acute infarction, hemorrhage, hydrocephalus, or mass lesion/mass effect. 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 Normal cerebral volume and white matter appearance. IMPRESSION: 1. No acute finding. 2. Small, remote right cerebellar infarct. 3. Motion degraded at the vertex. These slices could be repeated depending on clinical concern. Electronically Signed   By: Marnee Spring M.D.   On: 06/19/2015 19:03   US Renal  06/18/2015  CLINICAL DATA:  Acute renal injury EXAM: RENAL / URINARY TRACT ULTRASOUND COMPLETE COMPARISON:  02/09/2015 FINDINGS: Right Kidney: Length: 10 cm. Echogenicity within normal limits. No mass or hydronephrosis visualized. Left Kidney: Length: 9.8 cm. A 1.4 cm cystic structure is noted in the midportion of the left kidney likely representing a small parapelvic cyst. This has increased in size from the prior exam a which time it measured 7 mm. Bladder: Appears normal for degree of bladder distention. IMPRESSION: Left renal cyst growing in size from the prior study. No other focal abnormality is seen. Electronically Signed   By: Alcide Clever M.D.   On: 06/18/2015 10:33   Dg Chest Port 1 View  06/22/2015  CLINICAL DATA:  Hypoxia. EXAM: PORTABLE CHEST 1 VIEW COMPARISON:  06/20/2015. FINDINGS: Normal sized heart. Mild decrease in diffuse bilateral airspace opacity. No pleural fluid. Unremarkable bones. IMPRESSION: Improving bilateral pneumonia or alveolar edema. Electronically Signed   By: Beckie Salts M.D.   On: 06/22/2015 10:44   Dg Chest Port 1 View  06/20/2015  CLINICAL DATA:  Hypoxia.  Shortness of breath. EXAM: PORTABLE CHEST 1 VIEW COMPARISON:  06/16/1968  chest radiograph. FINDINGS: Stable cardiomediastinal silhouette with normal heart size. No pneumothorax. No pleural effusion. There are severe linear and fluffy airspace opacities throughout the parahilar lungs bilaterally, significantly worsened since 06/17/2015. Stable nondisplaced lateral right tenth rib fracture. IMPRESSION: 1. Significant worsening of severe linear and fluffy airspace opacities throughout the bilateral parahilar lungs, favor atypical pneumonia or noncardiogenic pulmonary edema/ARDS. 2. Stable nondisplaced lateral right tenth rib fracture. Electronically Signed   By: Delbert Phenix M.D.   On: 06/20/2015 22:05    Microbiology: Recent Results (from the past 240 hour(s))  C difficile quick scan w PCR reflex     Status: Abnormal   Collection Time: 06/21/15  4:26 PM  Result Value Ref Range Status   C Diff antigen (A) NEGATIVE Final    FORMED STOOL SPECIMEN SUBMITTED.  DOES NOT MEET TESTING CRITERIA, ORDER CREDITED   C Diff toxin (A) NEGATIVE Final    FORMED STOOL SPECIMEN SUBMITTED.  DOES NOT MEET TESTING CRITERIA, ORDER CREDITED     Labs: Basic Metabolic Panel:  Recent Labs Lab 06/20/15 0725 06/20/15 2305 06/21/15 0610 06/22/15 0546 06/24/15 0643  NA 142 141 143 143 139  K 4.7 4.5 4.2 3.6 3.5  CL 113* 113* 111 106 101  CO2 20* 20* 20* 25 30  GLUCOSE 148* 181* 175* 98 101*  BUN 40* 39* 40* 46* 48*  CREATININE 1.96* 2.01* 2.20* 2.39* 1.86*  CALCIUM 8.5* 8.6* 8.8* 9.0 9.1   Liver Function Tests:  Recent Labs Lab 06/18/15 0629  AST 50*  ALT 21  ALKPHOS 56  BILITOT 0.7  PROT 6.8  ALBUMIN 3.7   CBC:  Recent Labs Lab 06/18/15 0629 06/19/15 0636 06/20/15 2305  WBC 13.1* 13.7* 12.6*  HGB 10.5* 10.7* 10.0*  HCT 31.3* 31.8* 30.8*  MCV 90.7 90.9 91.9  PLT 335 315 328   Cardiac Enzymes:  Recent Labs Lab 06/19/15 0636 06/20/15 0725 06/20/15 2305 06/21/15 1021 06/21/15 1619 06/21/15 2120  CKTOTAL 741* 363  --   --   --   --   TROPONINI  --   --   0.09* 0.10* 0.10* 0.09*      Recent Labs  02/01/15 1530 02/03/15 0835 06/20/15 2305  BNP 222.0* 602.0* 1182.0*   CBG:  Recent Labs Lab 06/21/15 0721 06/21/15 1141 06/21/15 1622 06/21/15 2143 06/22/15 0734  GLUCAP 142* 150* 143* 137* 88    Principal Problem:   Acute on chronic respiratory failure with hypoxia (HCC) Active Problems:   Essential hypertension   Schizophrenia (HCC)   Anxiety state   AKI (acute kidney injury) (HCC)   Acute encephalopathy   Acute on chronic diastolic CHF (congestive heart failure) (HCC)   Time coordinating discharge: 35 minutes  Signed:  [name], MD [facility] 06/24/2015, 7:33 PM    By signing my name below, I, Zadie Cleverly attest that this documentation has been prepared under the direction and in the presence of [name], MD Electronically signed: Zadie Cleverly  06/24/2015 9:01am   I personally performed the services described in this documentation. All medical record entries made by the scribe were at my direction. I have reviewed the chart and agree that the record reflects my personal performance and is accurate and complete. [name], MD

## 2015-06-24 NOTE — Progress Notes (Signed)
[facility]  Patient Status: New  [facility] is providing the following services: RN and PT  If patient discharges after hours, please call [phone].   Jared Austin 06/24/2015, 12:10 PM

## 2015-06-24 NOTE — Progress Notes (Signed)
Physical Therapy Treatment Patient Details Name: Jared Austin MRN: [id number] DOB: 1945/07/12 Today's Date: 06/24/2015    History of Present Illness 70yo black male found on floor at home by brother. PMH: GAD, schizophrenia, PNA, HTN, Stg3 KD, CHF. Pt move to step down on 2/17 after respiratory failure. Pt is on O2 chronically at home, but is noncompliant per niece.     PT Comments    Pt reports feeling well this AM.  RN had taken him off of supplemental O2 with resting O2 sat=96%.  He was instructed in gait, room air, and was able to ambulate 150' with no loss of balance but did frequently reach for railing to stabilize.  O2 sat decreased to 86% with exertion.  He was instructed in gait with a walker for energy conservation/stabilization.  Supplemental O2 was resumed at 3 L/min.  Resting O2 sat=94%.  Gait with walker very stable with O2 sat=92% after walking 150' on 3 L O2.  He was too fatigued to attempt steps this AM.  Follow Up Recommendations  Home health PT     Equipment Recommendations  Rolling walker with 5" wheels    Recommendations for Other Services  none     Precautions / Restrictions Precautions Precautions: None Restrictions Weight Bearing Restrictions: No    Mobility  Bed Mobility Overal bed mobility: Independent                Transfers Overall transfer level: Independent Equipment used: Rolling walker (2 wheeled)                Ambulation/Gait Ambulation/Gait assistance: Min guard Ambulation Distance (Feet): 150 Feet (x 2 laps) Assistive device: Rolling walker (2 wheeled);None Gait Pattern/deviations: WFL(Within Functional Limits)   Gait velocity interpretation: >2.62 ft/sec, indicative of independent community ambulator General Gait Details: tends to reach for walls, railing during gait...instructed in use of a walker for increased stability and energy conservation   Stairs            Wheelchair Mobility    Modified Rankin  (Stroke Patients Only)       Balance Overall balance assessment: Modified Independent                                  Cognition Arousal/Alertness: Awake/alert Behavior During Therapy: WFL for tasks assessed/performed Overall Cognitive Status: History of cognitive impairments - at baseline                      Exercises      General Comments        Pertinent Vitals/Pain Pain Assessment: No/denies pain    Home Living Family/patient expects to be discharged to:: Private residence Living Arrangements: Other relatives Available Help at Discharge: Family;Available PRN/intermittently Type of Home: House Home Access: Stairs to enter   Home Layout: Multi-level;Bed/bath upstairs        Prior Function            PT Goals (current goals can now be found in the care plan section) Progress towards PT goals: Progressing toward goals    Frequency  Min 3X/week    PT Plan Current plan remains appropriate    Co-evaluation             End of Session Equipment Utilized During Treatment: Gait belt;Oxygen Activity Tolerance: Patient tolerated treatment well;Patient limited by fatigue Patient left: in bed;with call bell/phone within reach;with bed alarm set;with  nursing/sitter in room     Time: 0827-0854 PT Time Calculation (min) (ACUTE ONLY): 27 min  Charges:  $Gait Training: 23-37 mins                    G Codes:      Myrlene Broker L  PT 06/24/2015, 8:58 AM

## 2015-06-24 NOTE — Care Management Important Message (Signed)
Important Message  Patient Details  Name: Jared Austin MRN: [id number] Date of Birth: 1946/01/19   Medicare Important Message Given:  Yes    Malcolm Metro, RN 06/24/2015, 12:20 PM

## 2015-06-24 NOTE — Progress Notes (Signed)
Discharge instructions reviewed with patient, his brother and niece Alphonzo Lemmings. Understanding verbalized by all. Ready for discharge home.

## 2015-06-24 NOTE — Progress Notes (Signed)
Patients O2 sat on room air 94%. Ambulating on room air 88%. O2 sat at 3 liters by [place] ambulating 92%.

## 2015-06-24 NOTE — Care Management Note (Signed)
Case Management Note  Patient Details  Name: Jared Austin MRN: [id number] Date of Birth: [birth year]  Subjective/Objective:                  Pt is from home, lives with brother and is ind with ADL's. Pt plans to return home with brother at discharge. PT has recommended HH PT and that pt use RW. Pt received RW in the recent past and no longer has it but states he will get one and does not want one ordered. Pt has chosen [facility] for [facility] services. Per pt's niece, pt has recently fired his PCP.   Action/Plan: Pt's niece given list of PCP's accepting new pt's and application for [facility]. Pt made f/u appointment at the [facility] as not not have delay in [facility] therapies but pt's niece aware she will need to assist pt in pursuing finding PCP for long-term care. Alroy Bailiff, of [facility], made aware of DC plan and will obtain pt info from chart. Pt made aware HH has 48 hours to initiate services. No further CM needs.   Expected Discharge Date:    06/24/2015              Expected Discharge Plan:  Home w Home Health Services  In-House Referral:  NA  Discharge planning Services  CM Consult  Post Acute Care Choice:  Home Health Choice offered to:  Patient  DME Arranged:    DME Agency:     HH Arranged:  RN, PT HH Agency:  [facility]  Status of Service:  Completed, signed off  Medicare Important Message Given:  Yes Date Medicare IM Given:    Medicare IM give by:    Date Additional Medicare IM Given:    Additional Medicare Important Message give by:     If discussed at Long Length of Stay Meetings, dates discussed:  06/24/2015  Additional Comments:  Malcolm Metro, RN 06/24/2015, 12:21 PM

## 2015-07-06 ENCOUNTER — Encounter (HOSPITAL_COMMUNITY): Payer: Self-pay | Admitting: Emergency Medicine

## 2015-07-06 ENCOUNTER — Emergency Department (HOSPITAL_COMMUNITY)
Admission: EM | Admit: 2015-07-06 | Discharge: 2015-07-06 | Disposition: A | Discharge status: Other Acute Inpatient Hospital | Payer: Medicare Other | Attending: Emergency Medicine | Admitting: Emergency Medicine

## 2015-07-06 DIAGNOSIS — Z880 Allergy status to penicillin: Secondary | ICD-10-CM | POA: Insufficient documentation

## 2015-07-06 DIAGNOSIS — R440 Auditory hallucinations: Secondary | ICD-10-CM | POA: Insufficient documentation

## 2015-07-06 DIAGNOSIS — R443 Hallucinations, unspecified: Secondary | ICD-10-CM

## 2015-07-06 DIAGNOSIS — I10 Essential (primary) hypertension: Secondary | ICD-10-CM | POA: Insufficient documentation

## 2015-07-06 DIAGNOSIS — F17210 Nicotine dependence, cigarettes, uncomplicated: Secondary | ICD-10-CM | POA: Insufficient documentation

## 2015-07-06 DIAGNOSIS — Z7982 Long term (current) use of aspirin: Secondary | ICD-10-CM | POA: Diagnosis not present

## 2015-07-06 DIAGNOSIS — Z8701 Personal history of pneumonia (recurrent): Secondary | ICD-10-CM | POA: Insufficient documentation

## 2015-07-06 DIAGNOSIS — R441 Visual hallucinations: Secondary | ICD-10-CM | POA: Diagnosis not present

## 2015-07-06 DIAGNOSIS — R410 Disorientation, unspecified: Secondary | ICD-10-CM | POA: Insufficient documentation

## 2015-07-06 DIAGNOSIS — Z79899 Other long term (current) drug therapy: Secondary | ICD-10-CM | POA: Insufficient documentation

## 2015-07-06 DIAGNOSIS — Z9889 Other specified postprocedural states: Secondary | ICD-10-CM | POA: Insufficient documentation

## 2015-07-06 DIAGNOSIS — R4182 Altered mental status, unspecified: Secondary | ICD-10-CM | POA: Diagnosis present

## 2015-07-06 LAB — CBC WITH DIFFERENTIAL/PLATELET
BASOS ABS: 0 10*3/uL (ref 0.0–0.1)
Basophils Relative: 0 %
EOS ABS: 0.2 10*3/uL (ref 0.0–0.7)
EOS PCT: 2 %
HCT: 29.6 % — ABNORMAL LOW (ref 39.0–52.0)
Hemoglobin: 9.7 g/dL — ABNORMAL LOW (ref 13.0–17.0)
LYMPHS ABS: 1 10*3/uL (ref 0.7–4.0)
LYMPHS PCT: 14 %
MCH: 30.9 pg (ref 26.0–34.0)
MCHC: 32.8 g/dL (ref 30.0–36.0)
MCV: 94.3 fL (ref 78.0–100.0)
MONO ABS: 0.3 10*3/uL (ref 0.1–1.0)
Monocytes Relative: 5 %
Neutro Abs: 5.5 10*3/uL (ref 1.7–7.7)
Neutrophils Relative %: 79 %
PLATELETS: 465 10*3/uL — AB (ref 150–400)
RBC: 3.14 MIL/uL — ABNORMAL LOW (ref 4.22–5.81)
RDW: 13.8 % (ref 11.5–15.5)
WBC: 7 10*3/uL (ref 4.0–10.5)

## 2015-07-06 LAB — RAPID URINE DRUG SCREEN, HOSP PERFORMED
AMPHETAMINES: NOT DETECTED
BENZODIAZEPINES: NOT DETECTED
Barbiturates: NOT DETECTED
COCAINE: NOT DETECTED
OPIATES: NOT DETECTED
Tetrahydrocannabinol: NOT DETECTED

## 2015-07-06 LAB — COMPREHENSIVE METABOLIC PANEL
ALT: 18 U/L (ref 17–63)
AST: 36 U/L (ref 15–41)
Albumin: 3.8 g/dL (ref 3.5–5.0)
Alkaline Phosphatase: 56 U/L (ref 38–126)
Anion gap: 7 (ref 5–15)
BUN: 32 mg/dL — ABNORMAL HIGH (ref 6–20)
CHLORIDE: 107 mmol/L (ref 101–111)
CO2: 24 mmol/L (ref 22–32)
Calcium: 9.1 mg/dL (ref 8.9–10.3)
Creatinine, Ser: 2.21 mg/dL — ABNORMAL HIGH (ref 0.61–1.24)
GFR, EST AFRICAN AMERICAN: 33 mL/min — AB (ref >60)
GFR, EST NON AFRICAN AMERICAN: 29 mL/min — AB (ref >60)
Glucose, Bld: 88 mg/dL (ref 65–99)
POTASSIUM: 3.9 mmol/L (ref 3.5–5.1)
SODIUM: 138 mmol/L (ref 135–145)
Total Bilirubin: 0.8 mg/dL (ref 0.3–1.2)
Total Protein: 7.4 g/dL (ref 6.5–8.1)

## 2015-07-06 LAB — ETHANOL

## 2015-07-06 MED ORDER — ISOSORBIDE MONONITRATE ER 30 MG PO TB24
30.0000 mg | ORAL_TABLET | Freq: Every day | ORAL | Status: DC
  Administered 2015-07-06: 30 mg via ORAL
  Filled 2015-07-06 (×2): qty 1

## 2015-07-06 MED ORDER — STERILE WATER FOR INJECTION IJ SOLN
INTRAMUSCULAR | Status: AC
  Administered 2015-07-06: 1 mL
  Filled 2015-07-06: qty 10

## 2015-07-06 MED ORDER — ATORVASTATIN CALCIUM 10 MG PO TABS
10.0000 mg | ORAL_TABLET | Freq: Every day | ORAL | Status: DC
  Administered 2015-07-06: 10 mg via ORAL
  Filled 2015-07-06 (×2): qty 1

## 2015-07-06 MED ORDER — ZIPRASIDONE MESYLATE 20 MG IM SOLR
10.0000 mg | Freq: Once | INTRAMUSCULAR | Status: AC
  Administered 2015-07-06: 10 mg via INTRAMUSCULAR
  Filled 2015-07-06: qty 20

## 2015-07-06 MED ORDER — QUETIAPINE FUMARATE 25 MG PO TABS
25.0000 mg | ORAL_TABLET | Freq: Two times a day (BID) | ORAL | Status: DC
  Administered 2015-07-06: 25 mg via ORAL
  Filled 2015-07-06 (×3): qty 1

## 2015-07-06 MED ORDER — FOLIC ACID 1 MG PO TABS
1.0000 mg | ORAL_TABLET | Freq: Every day | ORAL | Status: DC
  Administered 2015-07-06: 1 mg via ORAL
  Filled 2015-07-06: qty 1

## 2015-07-06 MED ORDER — ASPIRIN EC 81 MG PO TBEC
81.0000 mg | DELAYED_RELEASE_TABLET | Freq: Every day | ORAL | Status: DC
  Administered 2015-07-06: 81 mg via ORAL
  Filled 2015-07-06: qty 1

## 2015-07-06 MED ORDER — VITAMIN B-1 100 MG PO TABS
100.0000 mg | ORAL_TABLET | Freq: Every day | ORAL | Status: DC
  Administered 2015-07-06: 100 mg via ORAL
  Filled 2015-07-06: qty 1

## 2015-07-06 MED ORDER — ADULT MULTIVITAMIN W/MINERALS CH
1.0000 | ORAL_TABLET | Freq: Every day | ORAL | Status: DC
  Administered 2015-07-06: 1 via ORAL
  Filled 2015-07-06: qty 1

## 2015-07-06 MED ORDER — AMANTADINE HCL 100 MG PO CAPS
100.0000 mg | ORAL_CAPSULE | Freq: Three times a day (TID) | ORAL | Status: DC
  Administered 2015-07-06: 100 mg via ORAL
  Filled 2015-07-06 (×3): qty 1

## 2015-07-06 NOTE — ED Notes (Signed)
Per Jared Austin, [facility] with [facility], [place] has been [facility] [place] and can go via sheriff's dept.  Waiting for IVC paperwork to be served, then [facility] will transport.  Spoke with Jared SallesFelicia Austin, assessment processor at [facility], and she confirmed that the pt has been accepted and they will assign him a bed when he arrives.  Numbers to call report are as follows:  [phone] or [phone].

## 2015-07-06 NOTE — ED Notes (Signed)
Pt left with  [facility] deputy to be [facility] [place].  Was told by CSW, Simonne ComeLeo at [facility] that [facility] would call brother and make him aware.

## 2015-07-06 NOTE — BH Assessment (Addendum)
Tele Assessment Note   Jared Austin is an 70 y.o. male. Pt presents voluntarily to [facility] his brother. Pt is restless and often looks at his arm where he just gave blood sample. He is a poor historian. Pt answers a few of writer's questions. His speech is a bit garbled and some of his answers are incoherent. Pt is oriented to person, place but not situation. He thinks it is Jun 29, 2015. He denies substance use. Pt's brother Reznor Ferrando is bedside. Pt becomes angry when writer asks brother if pt lives with brother. Pt says something like "a man and a woman live together". Pt then looks at Clinical research associate and says, "I'm not no punk or nothing like that." He reports that he "is not no punk" again after a few more questions. He says that he has been to a inpatient hospital where a person named "Black" was. Pt is irritable and doesn't answer anymore of pt's questions.   Pt's brother Al Cappiello is pt's HPOA and pt lives with Al. Brother's cell [phone], home [phone]. Brother provides collateral info. He says pt is "getting worse." He says pt hasn't slept in two days. He reports pt is having hallucinations. Al reports that pt is seeing dogs in the house and opening the door to let them outside. Brother reports that on 07/04/15 pt was standing in front on their neighbor's house wearing pants only and the weather was cold.  He says pt was inpatient at [facility] several years ago. Brother says pt goes to [facility] in [place] for med management. He reports pt's "medicine for sleeping" was changed from one pill to two pills. He says the two pills don't help pt sleep. Brother denies family hx of suicide. He says his maternal uncle had schizophrenia. Brother says pt does not use a cane.   Diagnosis:  Unspecified Schizophrenia or Other Psychotic Disorder  Past Medical History:  Past Medical History  Diagnosis Date  . Anxiety   . Schizophrenia (HCC)   . Pneumonia   . Hypertension     Past Surgical History   Procedure Laterality Date  . Cardiac catheterization N/A 02/10/2015    Procedure: Left Heart Cath and Coronary Angiography;  Surgeon: Orpah Cobb, MD;  [facility] IN[facility] CV LAB;  Service: Cardiovascular;  Laterality: N/A;    Family History: History reviewed. No pertinent family history.  Social History:  reports that he has been smoking Cigarettes.  He has a 93 pack-year smoking history. He has never used smokeless tobacco. He reports that he does not drink alcohol or use illicit drugs.  Additional Social History:  Alcohol / Drug Use Pain Medications: no abuse reported Prescriptions: no abuse reported Over the Counter: no abuse reported History of alcohol / drug use?: No history of alcohol / drug abuse  CIWA: CIWA-Ar BP: 154/86 mmHg Pulse Rate: 86 COWS:    PATIENT STRENGTHS: (choose at least two) Communication skills Physical Health Supportive family/friends  Allergies:  Allergies  Allergen Reactions  . Penicillins Anxiety    Has patient had a PCN reaction causing immediate rash, facial/tongue/throat swelling, SOB or lightheadedness with hypotension: Yes Has patient had a PCN reaction causing severe rash involving mucus membranes or skin necrosis: No Has patient had a PCN reaction that required hospitalization No Has patient had a PCN reaction occurring within the last 10 years: No If all of the above answers are "NO", then may proceed with Cephalosporin use.      Home Medications:  (Not in  a hospital admission)  OB/GYN Status:  No LMP for male patient.  General Assessment Data [facility] of Assessment: [facility] TTS Assessment: In system Is this a Tele or Face-to-Face Assessment?: Tele Assessment Is this an Initial Assessment or a Re-assessment for this encounter?: Initial Assessment Marital status: Single Is patient pregnant?: No Pregnancy Status: No Living Arrangements: Other relatives (brother al Sollers) Can pt return to current living arrangement?:  Yes Admission Status: Voluntary Is patient capable of signing voluntary admission?: Yes Referral Source: Self/Family/Friend Insurance type: medicare     Crisis Care Plan Living Arrangements: Other relatives (brother al Beamon) Name of Psychiatrist: [facility]  Education Status Is patient currently in school?: No  Risk to self with the past 6 months Suicidal Ideation:  (unable to assess) Has patient been a risk to self within the past 6 months prior to admission? : No (per brother) Suicidal Intent:  (unable to assess) Has patient had any suicidal intent within the past 6 months prior to admission? : No (per brother) Is patient at risk for suicide?: No Suicidal Plan?:  (unable to assess) Has patient had any suicidal plan within the past 6 months prior to admission? : No (per brother) Access to Means:  (n/a) What has been your use of drugs/alcohol within the last 12 months?: none How many times?: 0 Other Self Harm Risks: unable to assess Triggers for Past Attempts:  (unable to assess) Intentional Self Injurious Behavior:  (unable to assess) Family Suicide History: No (per brother) Recent stressful life event(s):  (pt denies) Persecutory voices/beliefs?:  (unable to assess) Depression:  (unable to assess) Depression Symptoms: Feeling angry/irritable Substance abuse history and/or treatment for substance abuse?: No Suicide prevention information given to non-admitted patients: Not applicable  Risk to Others within the past 6 months Homicidal Ideation:  (unable to assess) Does patient have any lifetime risk of violence toward others beyond the six months prior to admission? : No (per brother) Thoughts of Harm to Others:  (unable to assess) Current Homicidal Intent:  (unable to assess) Current Homicidal Plan:  (unable to assess) Access to Homicidal Means: No Identified Victim: none History of harm to others?: No Assessment of Violence: None Noted Violent Behavior Description:  unable to assess Does patient have access to weapons?: Yes (Comment) (knives) Criminal Charges Pending?: No Does patient have a court date: No Is patient on probation?: No  Psychosis Hallucinations: Auditory, Visual (per brother)  Mental Status Report Appearance/Hygiene: Unremarkable, Other (Comment) (in street clothes) Eye Contact: Fair Motor Activity: Freedom of movement, Restlessness Speech: Logical/coherent, Incoherent (garbled at times) Level of Consciousness: Alert, Quiet/awake, Restless Mood:  (unable to assess) Affect: Preoccupied, Irritable Anxiety Level:  (unable to assess) Thought Processes: Circumstantial, Irrelevant Judgement: Impaired Orientation: Person, Place (thinks it is Jun 29, 2015) Obsessive Compulsive Thoughts/Behaviors: Unable to Assess  Cognitive Functioning Concentration: Unable to Assess Memory: Unable to Assess IQ: Average Insight: Poor Impulse Control: Fair Appetite: Fair Sleep: Decreased Total Hours of Sleep: 0 (no sleep in two days) Vegetative Symptoms: None  ADLScreening [facility]) Patient's cognitive ability adequate to safely complete daily activities?: Yes Patient able to express need for assistance with ADLs?: Yes Independently performs ADLs?: Yes (appropriate for developmental age)  Prior Inpatient Therapy Prior Inpatient Therapy: Yes Prior Therapy Dates: several years ago - date unknown Prior Therapy Facilty[facility]Provider([facility] Reason for Treatment: psychosis  Prior Outpatient Therapy Prior Outpatient Therapy: Yes Prior Therapy Dates: currently Prior Therapy Facilty/Provider([place] [facility] [place] Reason for Treatment: med management, schizophrenia Does  patient have an ACCT team?: No Does patient have Intensive In-House Services?  : No Does patient have [facility] services? : No Does patient have P4CC services?: No  ADL Screening (condition at time of admission) Patient's cognitive ability adequate to  safely complete daily activities?: Yes Is the patient deaf or have difficulty hearing?: Yes Does the patient have difficulty seeing, even when wearing glasses/contacts?: No Does the patient have difficulty concentrating, remembering, or making decisions?: Yes Patient able to express need for assistance with ADLs?: Yes Does the patient have difficulty dressing or bathing?: No Independently performs ADLs?: Yes (appropriate for developmental age) Walks in Home: Independent (brother states pt doesn't use a cane) Does the patient have difficulty walking or climbing stairs?: No Weakness of Legs: None Weakness of Arms/Hands: None  Home Assistive Devices/Equipment Home Assistive Devices/Equipment: None (brother states pt doesn't use a cane)    Abuse/Neglect Assessment (Assessment to be complete while patient is alone) Physical Abuse:  (unable to assess) Verbal Abuse:  (unable to assess) Sexual Abuse:  (unable to assess) Exploitation of patient/patient's resources:  (unable to assess) Self-Neglect:  (unable to assess)     Advance Directives (For Healthcare) Does patient have an advance directive?: Yes Type of Advance Directive: Healthcare Power of [place] (HPOA is brother Therapist, occupational)    Additional Information 1:1 In Past 12 Months?: No CIRT Risk: No Elopement Risk: Yes Does patient have medical clearance?: Yes     Disposition:  Disposition Initial Assessment Completed for this Encounter: Yes Disposition of Patient: Inpatient treatment program Hillery Jacks recommends inpatient treatment)  Ashdon Gillson P 07/06/2015 1:36 PM

## 2015-07-06 NOTE — ED Notes (Signed)
Patient "actually" brought to ER by brother for hyperactivity. Patient alert and oriented. Medications recently adjusted per brother.

## 2015-07-06 NOTE — ED Notes (Signed)
Tele psych in progress at this time. 

## 2015-07-06 NOTE — ED Notes (Signed)
Patient c/o sore throat. Denies any cough or fevers. Denies any difficulty swallowing or breathing.

## 2015-07-06 NOTE — ED Provider Notes (Signed)
CSN: [id number]     Arrival date & time 07/06/15  0736 History   First MD Initiated Contact with Patient 07/06/15 [id number]     Chief Complaint  Patient presents with  . Altered Mental Status     (Consider location/radiation/quality/duration/timing/severity/associated sxs/prior Treatment) Patient is a 70 y.o. male presenting with altered mental status. The history is provided by a relative (The brother states that for the last 2 days the patient has been having visual and auditory hallucinations he went over to her neighbor's house without his clothes on. He thought there is a dog in the house and open up all the doors. Patient has schizophre).  Altered Mental Status Presenting symptoms: behavior changes   Severity:  Severe Most recent episode:  More than 2 days ago Episode history:  Multiple Timing:  Constant Progression:  Worsening Chronicity:  Recurrent Context: not alcohol use     Past Medical History  Diagnosis Date  . Anxiety   . Schizophrenia (HCC)   . Pneumonia   . Hypertension    Past Surgical History  Procedure Laterality Date  . Cardiac catheterization N/A 02/10/2015    Procedure: Left Heart Cath and Coronary Angiography;  Surgeon: Orpah Cobb, MD;  [facility] IN[facility];  Service: Cardiovascular;  Laterality: N/A;   History reviewed. No pertinent family history. Social History  Substance Use Topics  . Smoking status: Current Every Day Smoker -- 1.50 packs/day for 62 years    Types: Cigarettes  . Smokeless tobacco: Never Used  . Alcohol Use: No    Review of Systems  Unable to perform ROS: Mental status change      Allergies  Penicillins  Home Medications   Prior to Admission medications   Medication Sig Start Date End Date Taking? Authorizing Provider  amantadine (SYMMETREL) 100 MG capsule Take 100 mg by mouth 3 (three) times daily. 01/21/15  Yes Historical Provider, MD  aspirin EC 81 MG tablet Take 1 tablet (81 mg total) by mouth daily. 06/24/15   Yes Standley Brooking, MD  Multiple Vitamin (MULTIVITAMIN WITH MINERALS) TABS tablet Take 1 tablet by mouth daily. 06/24/15  Yes Standley Brooking, MD  QUEtiapine (SEROQUEL) 25 MG tablet Take 1 tablet (25 mg total) by mouth 2 (two) times daily. 06/24/15  Yes Standley Brooking, MD  thiamine 100 MG tablet Take 1 tablet (100 mg total) by mouth daily. 06/24/15  Yes Standley Brooking, MD  atorvastatin (LIPITOR) 10 MG tablet Take 1 tablet (10 mg total) by mouth daily. 06/24/15   Standley Brooking, MD  folic acid (FOLVITE) 1 MG tablet Take 1 tablet (1 mg total) by mouth daily. 06/24/15   Standley Brooking, MD  isosorbide mononitrate (IMDUR) 30 MG 24 hr tablet Take 1 tablet (30 mg total) by mouth daily. 06/24/15   Standley Brooking, MD   BP 154/86 mmHg  Pulse 86  Temp(Src) 98.3 F (36.8 C) (Oral)  Resp 18  Ht  (1.702 m)  Wt 135 lb (61.236 kg)  BMI 21.14 kg/m2  SpO2 99% Physical Exam  Constitutional: He appears well-developed.  HENT:  Head: Normocephalic.  Eyes: Conjunctivae and EOM are normal. No scleral icterus.  Neck: Neck supple. No thyromegaly present.  Cardiovascular: Normal rate and regular rhythm.  Exam reveals no gallop and no friction rub.   No murmur heard. Pulmonary/Chest: No stridor. He has no wheezes. He has no rales. He exhibits no tenderness.  Abdominal: He exhibits no distension. There is no tenderness. There  is no rebound.  Musculoskeletal: Normal range of motion. He exhibits no edema.  Lymphadenopathy:    He has no cervical adenopathy.  Neurological: He is alert. He exhibits normal muscle tone. Coordination normal.  Skin: No rash noted. No erythema.  Psychiatric:  Patient awake but very confused. Patient not making any sense when speaking. He is oriented to person place only    ED Course  Procedures (including critical care time) Labs Review Labs Reviewed  CBC WITH DIFFERENTIAL/PLATELET - Abnormal; Notable for the following:    RBC 3.14 (*)    Hemoglobin 9.7 (*)     HCT 29.6 (*)    Platelets 465 (*)    All other components within normal limits  COMPREHENSIVE METABOLIC PANEL - Abnormal; Notable for the following:    BUN 32 (*)    Creatinine, Ser 2.21 (*)    GFR calc non Af Amer 29 (*)    GFR calc Af Amer 33 (*)    All other components within normal limits  ETHANOL  URINE RAPID DRUG SCREEN, HOSP PERFORMED    Imaging Review No results found. I have personally reviewed and evaluated these images and lab results as part of my medical decision-making.   EKG Interpretation None      MDM   Final diagnoses:  None    Patient was seen by psych. He will be admitted to psych for hallucinations    Bethann BerkshireJoseph Mattie Nordell, MD 07/06/15 1416

## 2015-07-06 NOTE — ED Notes (Signed)
Pt became loud and cursing.  EDP aware and geodon is being ordered.  Deputy here to transport pt.

## 2017-01-06 IMAGING — CR DG CHEST 1V PORT
1 series · 1 of 1 positions shown · non-contrast
Comparison: Chest radiograph performed 02/01/2015

CLINICAL DATA: Acute onset of hypoxia.  Initial encounter.

EXAM:
PORTABLE CHEST 1 VIEW

[ap portable]
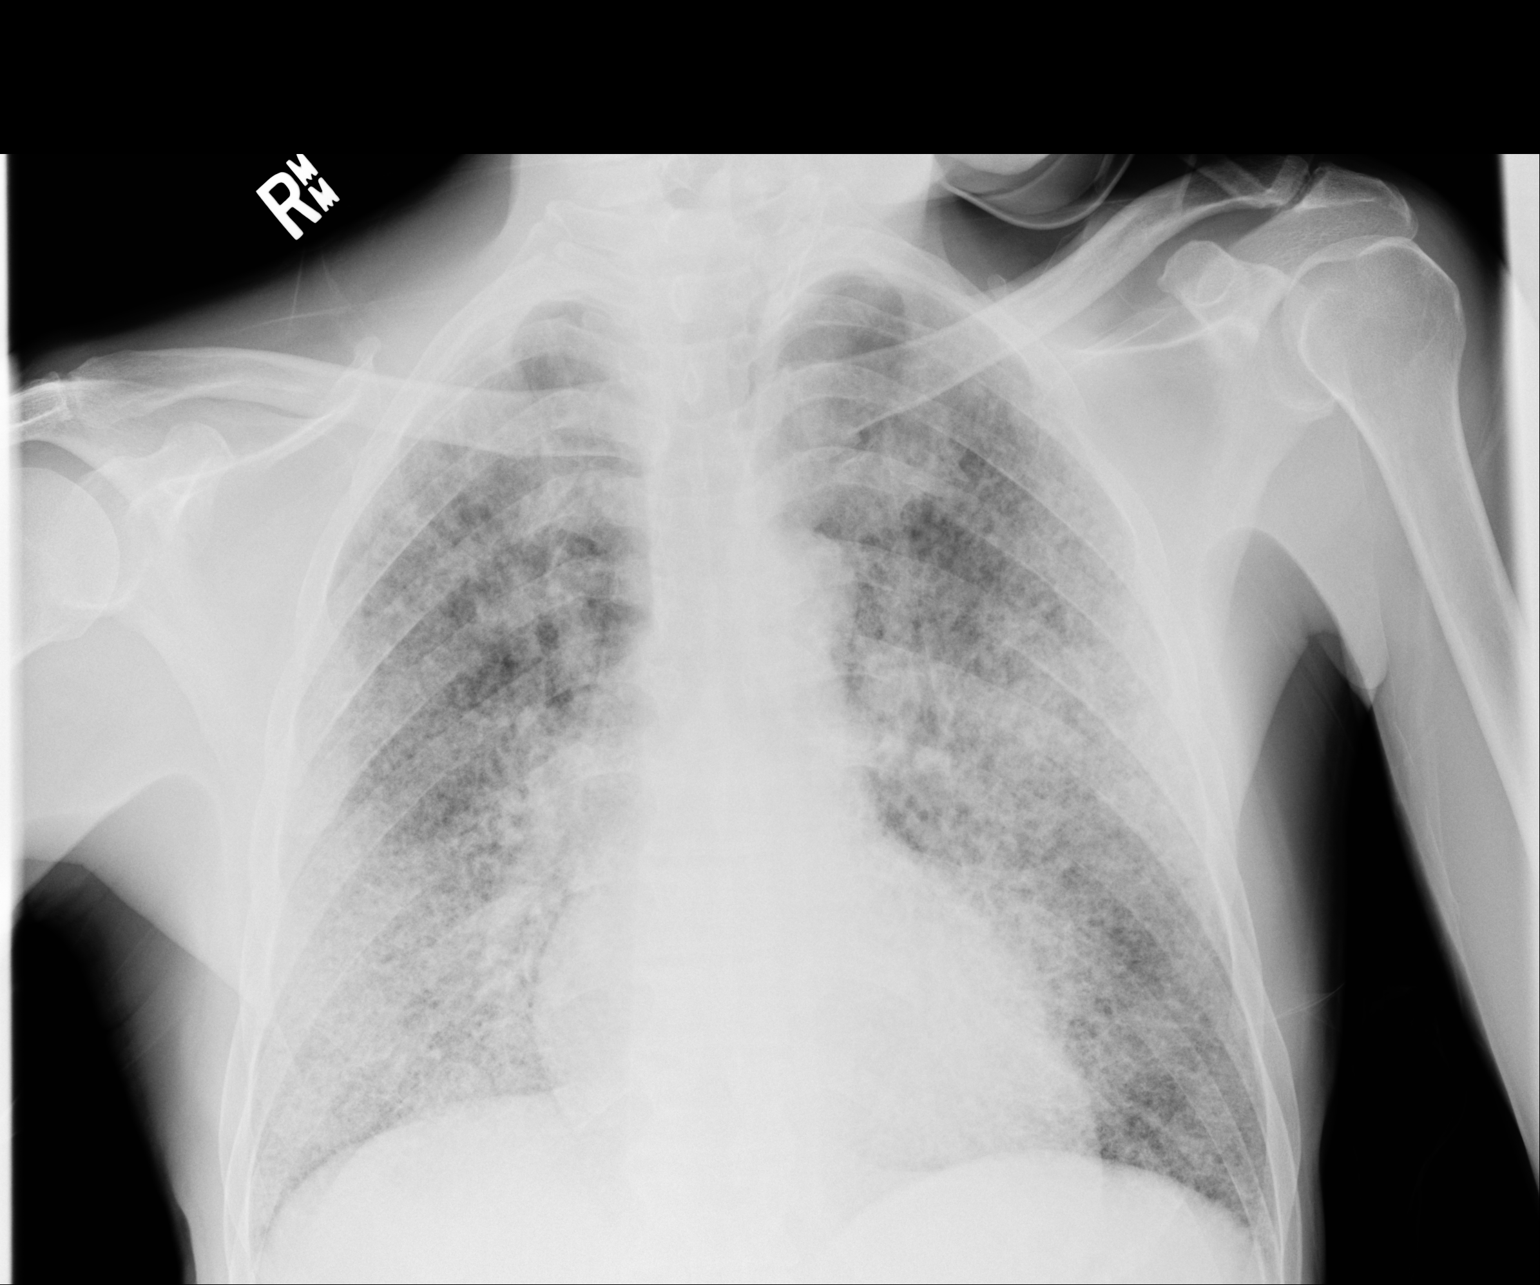

[1 of 1 positions shown; findings below may reference images not displayed]

FINDINGS: The lungs are well-aerated. Diffuse bilateral airspace opacification
is noted. This may reflect multifocal pneumonia or possibly
pulmonary edema. ARDS could have such an appearance. There is no
evidence of pleural effusion or pneumothorax.

The cardiomediastinal silhouette is within normal limits. No acute
osseous abnormalities are seen.
IMPRESSION: Diffuse bilateral airspace opacification noted. This may reflect
multifocal pneumonia or possibly pulmonary edema. ARDS could have
such an appearance.

## 2017-03-24 ENCOUNTER — Emergency Department (HOSPITAL_COMMUNITY): Payer: Medicare Other

## 2017-03-24 ENCOUNTER — Inpatient Hospital Stay (HOSPITAL_COMMUNITY)
Admission: EM | Admit: 2017-03-24 | Discharge: 2017-03-26 | DRG: 682 | Disposition: A | Discharge status: Home / Self Care | Payer: Medicare Other | Attending: Internal Medicine | Admitting: Internal Medicine

## 2017-03-24 ENCOUNTER — Encounter (HOSPITAL_COMMUNITY): Payer: Self-pay | Admitting: Emergency Medicine

## 2017-03-24 DIAGNOSIS — I10 Essential (primary) hypertension: Secondary | ICD-10-CM | POA: Diagnosis present

## 2017-03-24 DIAGNOSIS — I5032 Chronic diastolic (congestive) heart failure: Secondary | ICD-10-CM | POA: Diagnosis present

## 2017-03-24 DIAGNOSIS — F17210 Nicotine dependence, cigarettes, uncomplicated: Secondary | ICD-10-CM | POA: Diagnosis present

## 2017-03-24 DIAGNOSIS — N183 Chronic kidney disease, stage 3 unspecified: Secondary | ICD-10-CM

## 2017-03-24 DIAGNOSIS — W109XXA Fall (on) (from) unspecified stairs and steps, initial encounter: Secondary | ICD-10-CM | POA: Diagnosis present

## 2017-03-24 DIAGNOSIS — I130 Hypertensive heart and chronic kidney disease with heart failure and stage 1 through stage 4 chronic kidney disease, or unspecified chronic kidney disease: Secondary | ICD-10-CM | POA: Diagnosis present

## 2017-03-24 DIAGNOSIS — Z7982 Long term (current) use of aspirin: Secondary | ICD-10-CM

## 2017-03-24 DIAGNOSIS — R911 Solitary pulmonary nodule: Secondary | ICD-10-CM | POA: Diagnosis not present

## 2017-03-24 DIAGNOSIS — R319 Hematuria, unspecified: Secondary | ICD-10-CM | POA: Diagnosis present

## 2017-03-24 DIAGNOSIS — Z720 Tobacco use: Secondary | ICD-10-CM | POA: Diagnosis not present

## 2017-03-24 DIAGNOSIS — E875 Hyperkalemia: Secondary | ICD-10-CM | POA: Diagnosis present

## 2017-03-24 DIAGNOSIS — Z79899 Other long term (current) drug therapy: Secondary | ICD-10-CM | POA: Diagnosis not present

## 2017-03-24 DIAGNOSIS — W19XXXA Unspecified fall, initial encounter: Secondary | ICD-10-CM

## 2017-03-24 DIAGNOSIS — G9341 Metabolic encephalopathy: Secondary | ICD-10-CM | POA: Diagnosis present

## 2017-03-24 DIAGNOSIS — R739 Hyperglycemia, unspecified: Secondary | ICD-10-CM | POA: Diagnosis not present

## 2017-03-24 DIAGNOSIS — D631 Anemia in chronic kidney disease: Secondary | ICD-10-CM | POA: Diagnosis not present

## 2017-03-24 DIAGNOSIS — Z880 Allergy status to penicillin: Secondary | ICD-10-CM | POA: Diagnosis not present

## 2017-03-24 DIAGNOSIS — F203 Undifferentiated schizophrenia: Secondary | ICD-10-CM | POA: Diagnosis not present

## 2017-03-24 DIAGNOSIS — T39395A Adverse effect of other nonsteroidal anti-inflammatory drugs [NSAID], initial encounter: Secondary | ICD-10-CM | POA: Diagnosis present

## 2017-03-24 DIAGNOSIS — J449 Chronic obstructive pulmonary disease, unspecified: Secondary | ICD-10-CM | POA: Diagnosis present

## 2017-03-24 DIAGNOSIS — F209 Schizophrenia, unspecified: Secondary | ICD-10-CM | POA: Diagnosis present

## 2017-03-24 DIAGNOSIS — D638 Anemia in other chronic diseases classified elsewhere: Secondary | ICD-10-CM | POA: Diagnosis present

## 2017-03-24 DIAGNOSIS — E860 Dehydration: Secondary | ICD-10-CM | POA: Diagnosis present

## 2017-03-24 DIAGNOSIS — Y92009 Unspecified place in unspecified non-institutional (private) residence as the place of occurrence of the external cause: Secondary | ICD-10-CM | POA: Diagnosis not present

## 2017-03-24 DIAGNOSIS — N179 Acute kidney failure, unspecified: Principal | ICD-10-CM | POA: Diagnosis present

## 2017-03-24 LAB — CBC WITH DIFFERENTIAL/PLATELET
BASOS ABS: 0 10*3/uL (ref 0.0–0.1)
BASOS PCT: 0 %
Eosinophils Absolute: 0 10*3/uL (ref 0.0–0.7)
Eosinophils Relative: 0 %
HEMATOCRIT: 36.1 % — AB (ref 39.0–52.0)
HEMOGLOBIN: 11.3 g/dL — AB (ref 13.0–17.0)
Lymphocytes Relative: 11 %
Lymphs Abs: 1.1 10*3/uL (ref 0.7–4.0)
MCH: 29.7 pg (ref 26.0–34.0)
MCHC: 31.3 g/dL (ref 30.0–36.0)
MCV: 95 fL (ref 78.0–100.0)
Monocytes Absolute: 0.2 10*3/uL (ref 0.1–1.0)
Monocytes Relative: 2 %
NEUTROS ABS: 8.7 10*3/uL — AB (ref 1.7–7.7)
NEUTROS PCT: 87 %
Platelets: 311 10*3/uL (ref 150–400)
RBC: 3.8 MIL/uL — ABNORMAL LOW (ref 4.22–5.81)
RDW: 12.5 % (ref 11.5–15.5)
WBC: 9.9 10*3/uL (ref 4.0–10.5)

## 2017-03-24 LAB — RAPID URINE DRUG SCREEN, HOSP PERFORMED
AMPHETAMINES: NOT DETECTED
BENZODIAZEPINES: NOT DETECTED
Barbiturates: NOT DETECTED
Cocaine: NOT DETECTED
OPIATES: NOT DETECTED
TETRAHYDROCANNABINOL: NOT DETECTED

## 2017-03-24 LAB — COMPREHENSIVE METABOLIC PANEL
ALBUMIN: 4.4 g/dL (ref 3.5–5.0)
ALT: 11 U/L — ABNORMAL LOW (ref 17–63)
ANION GAP: 12 (ref 5–15)
AST: 44 U/L — AB (ref 15–41)
Alkaline Phosphatase: 70 U/L (ref 38–126)
BILIRUBIN TOTAL: 1 mg/dL (ref 0.3–1.2)
BUN: 51 mg/dL — AB (ref 6–20)
CHLORIDE: 106 mmol/L (ref 101–111)
CO2: 19 mmol/L — ABNORMAL LOW (ref 22–32)
Calcium: 9.6 mg/dL (ref 8.9–10.3)
Creatinine, Ser: 4.15 mg/dL — ABNORMAL HIGH (ref 0.61–1.24)
GFR calc Af Amer: 15 mL/min — ABNORMAL LOW (ref >60)
GFR, EST NON AFRICAN AMERICAN: 13 mL/min — AB (ref >60)
Glucose, Bld: 116 mg/dL — ABNORMAL HIGH (ref 65–99)
POTASSIUM: 5.6 mmol/L — AB (ref 3.5–5.1)
Sodium: 137 mmol/L (ref 135–145)
TOTAL PROTEIN: 7.2 g/dL (ref 6.5–8.1)

## 2017-03-24 LAB — URINALYSIS, ROUTINE W REFLEX MICROSCOPIC
Bilirubin Urine: NEGATIVE
GLUCOSE, UA: NEGATIVE mg/dL
Ketones, ur: NEGATIVE mg/dL
Leukocytes, UA: NEGATIVE
NITRITE: NEGATIVE
PROTEIN: NEGATIVE mg/dL
SPECIFIC GRAVITY, URINE: 1.014 (ref 1.005–1.030)
Squamous Epithelial / LPF: NONE SEEN
pH: 5 (ref 5.0–8.0)

## 2017-03-24 LAB — NA AND K (SODIUM & POTASSIUM), RAND UR
POTASSIUM UR: 55 mmol/L
SODIUM UR: 45 mmol/L

## 2017-03-24 LAB — ETHANOL: Alcohol, Ethyl (B): <10 mg/dL (ref <10)

## 2017-03-24 LAB — TROPONIN I: TROPONIN I: 0.03 ng/mL — AB (ref <0.03)

## 2017-03-24 LAB — CREATININE, URINE, RANDOM: CREATININE, URINE: 236.79 mg/dL

## 2017-03-24 MED ORDER — ONDANSETRON HCL 4 MG/2ML IJ SOLN
4.0000 mg | Freq: Four times a day (QID) | INTRAMUSCULAR | Status: DC | PRN
Start: 2017-03-24 — End: 2017-03-26

## 2017-03-24 MED ORDER — AMANTADINE HCL 100 MG PO CAPS
100.0000 mg | ORAL_CAPSULE | Freq: Three times a day (TID) | ORAL | Status: DC
  Administered 2017-03-24 – 2017-03-25 (×3): 100 mg via ORAL
  Filled 2017-03-24 (×12): qty 1

## 2017-03-24 MED ORDER — ACETAMINOPHEN 325 MG PO TABS
650.0000 mg | ORAL_TABLET | Freq: Four times a day (QID) | ORAL | Status: DC | PRN
  Administered 2017-03-25: 650 mg via ORAL
  Filled 2017-03-24: qty 2

## 2017-03-24 MED ORDER — SODIUM CHLORIDE 0.9% FLUSH
3.0000 mL | Freq: Two times a day (BID) | INTRAVENOUS | Status: DC
  Administered 2017-03-24: 3 mL via INTRAVENOUS

## 2017-03-24 MED ORDER — SODIUM CHLORIDE 0.9 % IV BOLUS (SEPSIS)
500.0000 mL | Freq: Once | INTRAVENOUS | Status: AC
  Administered 2017-03-24: 500 mL via INTRAVENOUS

## 2017-03-24 MED ORDER — DOCUSATE SODIUM 100 MG PO CAPS
100.0000 mg | ORAL_CAPSULE | Freq: Two times a day (BID) | ORAL | Status: DC
  Administered 2017-03-24 – 2017-03-25 (×3): 100 mg via ORAL
  Filled 2017-03-24 (×3): qty 1

## 2017-03-24 MED ORDER — NICOTINE 21 MG/24HR TD PT24
21.0000 mg | MEDICATED_PATCH | Freq: Every day | TRANSDERMAL | Status: DC
  Administered 2017-03-24 – 2017-03-25 (×2): 21 mg via TRANSDERMAL
  Filled 2017-03-24 (×2): qty 1

## 2017-03-24 MED ORDER — LACTATED RINGERS IV SOLN
INTRAVENOUS | Status: DC
  Administered 2017-03-24 – 2017-03-25 (×3): via INTRAVENOUS

## 2017-03-24 MED ORDER — SODIUM POLYSTYRENE SULFONATE 15 GM/60ML PO SUSP
15.0000 g | Freq: Once | ORAL | Status: AC
  Administered 2017-03-24: 15 g via ORAL
  Filled 2017-03-24: qty 60

## 2017-03-24 MED ORDER — ACETAMINOPHEN 650 MG RE SUPP: 650.0000 mg | Freq: Four times a day (QID) | RECTAL | Status: DC | PRN

## 2017-03-24 MED ORDER — ENOXAPARIN SODIUM 30 MG/0.3ML ~~LOC~~ SOLN
30.0000 mg | SUBCUTANEOUS | Status: DC
  Administered 2017-03-24: 30 mg via SUBCUTANEOUS
  Filled 2017-03-24: qty 0.3

## 2017-03-24 MED ORDER — SODIUM CHLORIDE 0.9 % IV BOLUS (SEPSIS): 500.0000 mL | Freq: Once | INTRAVENOUS | Status: DC

## 2017-03-24 MED ORDER — ONDANSETRON HCL 4 MG PO TABS: 4.0000 mg | ORAL_TABLET | Freq: Four times a day (QID) | ORAL | Status: DC | PRN

## 2017-03-24 NOTE — ED Notes (Signed)
EDP in room to speak with family  

## 2017-03-24 NOTE — ED Provider Notes (Signed)
[facility] EMERGENCY DEPARTMENT Provider Note   CSN: [id number] Arrival date & time: 03/24/17  1359     History   Chief Complaint Chief Complaint  Patient presents with  . Altered Mental Status  . Fall  Level 5 caveat secondary to patient amnestic for event  HPI Jared Austin is a 71 y.o. male.  HPI  71 year old male history of schizophrenia who presents today after fall down 10 stairs.  Niece and nephew are here as historians.  However, they do not live in the same household.  They are visiting.  They state that the patient lives with his brother, who is their father.  The patient's brother, who is not present, told his children that the patient has not been acting normally over the past 24 hours with increased agitation and confusion.  He reports that the patient was up and down all night.  They he fell down 10 steps.  He is complaining of some pain across his lower anterior chest or upper abdomen.  He complained of some pain in the left ankle and foot.  His niece and nephew were able to walk him into the car to bring him to the hospital.  They report that he has no history of alcohol abuse.  He reports that although he has schizophrenia at baseline his agitation and confusion over the past 24 hours appears to be a new symptom.  They report no definitive knowledge of fever, cough, nausea, vomiting, or ingestion.  They do not think that he takes his medications as prescribed.  Past Medical History:  Diagnosis Date  . Anxiety   . Hypertension   . Pneumonia   . Schizophrenia [facility](HCC)     Patient Active Problem List   Diagnosis Date Noted  . Acute on chronic respiratory failure with hypoxia (HCC) 06/21/2015  . Acute on chronic diastolic CHF (congestive heart failure) (HCC) 06/21/2015  . Acute encephalopathy 06/19/2015  . AKI (acute kidney injury) (HCC) 06/17/2015  . Acute coronary syndrome (HCC)   . Acute respiratory failure with hypercapnia (HCC)   . Acute pulmonary edema (HCC)    . COPD exacerbation (HCC)   . Tobacco abuse   . Chronic diastolic CHF (congestive heart failure) (HCC)   . Pulmonary hypertension (HCC)   . Acute kidney injury (HCC)   . Essential hypertension   . Schizophrenia (HCC)   . Anxiety state   . Pulmonary edema   . HCAP (healthcare-associated pneumonia)   . CAP (community acquired pneumonia) 02/01/2015  . Acute respiratory failure with hypoxia (HCC) 02/01/2015  . Elevated troponin 02/01/2015  . Acute kidney failure (HCC) 02/01/2015    Past Surgical History:  Procedure Laterality Date  . CARDIAC CATHETERIZATION N/A 02/10/2015   Procedure: Left Heart Cath and Coronary Angiography;  Surgeon: Orpah CobbAjay Kadakia, MD;  Location: [facility] INVASIVE CV LAB;  Service: Cardiovascular;  Laterality: N/A;       Home Medications    Prior to Admission medications   Medication Sig Start Date End Date Taking? Authorizing Provider  amantadine (SYMMETREL) 100 MG capsule Take 100 mg by mouth 3 (three) times daily. 01/21/15   [provider]  aspirin EC 81 MG tablet Take 1 tablet (81 mg total) by mouth daily. 06/24/15   Standley BrookingGoodrich, Daniel P, MD  atorvastatin (LIPITOR) 10 MG tablet Take 1 tablet (10 mg total) by mouth daily. 06/24/15   Standley BrookingGoodrich, Daniel P, MD  folic acid (FOLVITE) 1 MG tablet Take 1 tablet (1 mg total) by mouth daily. 06/24/15  Standley Brooking, MD  isosorbide mononitrate (IMDUR) 30 MG 24 hr tablet Take 1 tablet (30 mg total) by mouth daily. 06/24/15   Standley Brooking, MD  Multiple Vitamin (MULTIVITAMIN WITH MINERALS) TABS tablet Take 1 tablet by mouth daily. 06/24/15   Standley Brooking, MD  QUEtiapine (SEROQUEL) 25 MG tablet Take 1 tablet (25 mg total) by mouth 2 (two) times daily. 06/24/15   Standley Brooking, MD  thiamine 100 MG tablet Take 1 tablet (100 mg total) by mouth daily. 06/24/15   Standley Brooking, MD    Family History History reviewed. No pertinent family history.  Social History Social History   Tobacco Use  . Smoking  status: Current Every Day Smoker    Packs/day: 1.50    Years: 62.00    Pack years: 93.00    Types: Cigarettes  . Smokeless tobacco: Never Used  Substance Use Topics  . Alcohol use: No  . Drug use: No     Allergies   Penicillins   Review of Systems Review of Systems  Gastrointestinal: Positive for abdominal pain.  Musculoskeletal: Positive for gait problem and joint swelling.  All other systems reviewed and are negative.    Physical Exam Updated Vital Signs BP (!) 129/94 (BP Location: Left Arm)   Pulse (!) 103   Temp 100.1 F (37.8 C) (Rectal)   Resp 18   Ht 1.702 m (5\' 7" )   Wt 61.2 kg (135 lb)   SpO2 98%   BMI 21.14 kg/m   Physical Exam  Constitutional: He appears well-developed and well-nourished. No distress.  HENT:  Head: Normocephalic and atraumatic.  Right Ear: External ear normal.  Left Ear: External ear normal.  Mouth/Throat: Oropharynx is clear and moist.  Eyes: Pupils are equal, round, and reactive to light.  Neck: Normal range of motion. Neck supple.  Cardiovascular: Tachycardia present.  Pulmonary/Chest: Effort normal and breath sounds normal.  Abdominal: Soft. Bowel sounds are normal.  Musculoskeletal: Normal range of motion.  Abrasion with mild swelling of the left aspect of the left ankle.  There is moderate tenderness palpation and some palpation tenderness over the mid foot. Bilateral hips are ranged and do not appear to be tender.  Bilateral knees are palpated and both have some erythema on the anterior aspect but no specific tenderness is noted on my exam. Right foot appears normal and nontender.  Skin: Skin is warm and dry. Capillary refill takes less than 2 seconds.  Nursing note and vitals reviewed.    ED Treatments / Results  Labs (all labs ordered are listed, but only abnormal results are displayed) Labs Reviewed  CULTURE, BLOOD (ROUTINE X 2)  CULTURE, BLOOD (ROUTINE X 2)  CBC WITH DIFFERENTIAL/PLATELET  COMPREHENSIVE METABOLIC  PANEL  RAPID URINE DRUG SCREEN, HOSP PERFORMED  URINALYSIS, ROUTINE W REFLEX MICROSCOPIC  TROPONIN I  ETHANOL  I-STAT CG4 LACTIC ACID, ED    EKG  EKG Interpretation  Date/Time:  Thursday March 24 2017 14:10:57 EST Ventricular Rate:  102 PR Interval:    QRS Duration: 118 QT Interval:  351 QTC Calculation: 458 R Axis:   -50 Text Interpretation:  Sinus tachycardia Incomplete RBBB and LAFB Confirmed by Margarita Grizzle [phone]) on 03/24/2017 3:15:29 PM       Radiology No results found.  Procedures Procedures (including critical care time)  Medications Ordered in ED Medications  sodium chloride 0.9 % bolus 500 mL (not administered)     Initial Impression / Assessment and Plan / ED Course  I have reviewed the triage vital signs and the nursing notes.  Pertinent labs & imaging results that were available during my care of the patient were reviewed by me and considered in my medical decision making (see chart for details).     This is a 71 year old male with a history of schizophrenia who presents today from home with reports that he has been more weaker and has fallen down multiple steps.  Here he has some contusions of his knees and his left great toe has a fracture.  There appear to be no other obvious injuries from the fall.  However, he is in renal failure with creatinine elevated at 4.15 with most recent being 2.21.  He also has some mild hyperkalemia.  He has no acute EKG changes.  He appears stable.  He is receiving IV fluids..  Plan admission for further hydration and ongoing assessment of renal status. 5:46 PM Has not voided.  Additional 500 cc normal saline ordered and bladder scan. 5:55 PM 1- new onset renal failure-it has not urinated yet, however I think that this is likely due to volume depletion.  CT does note some urine in his bladder.  Planning Foley catheter for ongoing fluid status evaluation 2 fall patient fell down multiple stairs today.  He has a fracture  of his left great toe.  Head scan, neck scan, chest, abdomen, and pelvis revealed no evidence of acute trauma. 3 low-grade fever.  Temp 100.1 rectally here.  No evidence of acute infection on lung exam,, and no signs of cellulitis or skin infection, urine is still pending. 4- lung nodule incidentally found on CT 5- hyperkalemia-secondary to #1.  Patient has received Kayexalate and is being hydrated. Vitals:   03/24/17 1600 03/24/17 1630  BP: (!) 153/87 (!) 144/84  Pulse:  91  Resp: 20 19  Temp:    SpO2:  96%   Discussed with Dr. Ophelia CharterYates and she will see for admission Final Clinical Impressions(s) / ED Diagnoses   Final diagnoses:  Acute renal failure, unspecified acute renal failure type [facility](HCC)  Fall, initial encounter    ED Discharge Orders    None       Margarita Grizzleay, Ashanna Heinsohn, MD 03/24/17 1818

## 2017-03-24 NOTE — H&P (Signed)
History and Physical    Jared Austin ZOX:[id number] DOB: [birth year] DOA: 03/24/2017  PCP: Avon Gully, MD Patient coming from:  Home - lives with his brother   Chief Complaint: Fall  HPI: Jared Austin is a 71 y.o. male with medical history significant of schizophrenia and HTN presenting after a fall.  While pleasant, the patient is unable to provide any significant history due to his chronic mental illness.  There was also no family present at the time of my evaluation.  HPI per Dr. Rosalia Hammers:   71 year old male history of schizophrenia who presents today after fall down 10 stairs. Niece and nephew are here as historians. However, they do not live in the same household. They are visiting. They state that the patient lives with his brother, who is their father. The patient's brother, who is not present, told his children that the patient has not been acting normally over the past 24 hours with increased agitation and confusion. He reports that the patient was up and down all night. They he fell down 10 steps. He is complaining of some pain across his lower anterior chest or upper abdomen. He complained of some pain in the left ankle and foot. His niece and nephew were able to walk him into the car to bring him to the hospital. They report that he has no history of alcohol abuse. He reports that although he has schizophrenia at baseline his agitation and confusion over the past 24 hours appears to be a new symptom. They report no definitive knowledge of fever, cough, nausea, vomiting, or ingestion. They do not think that he takes his medications as prescribed.  ED Course:  Acute on chronic renal insufficiency, Cr 4.15 up from 2.21.  Mild hyperkalemia without EKG changes, given kayexalate.  IVF.  Foley placed with good UOP, likely prerenal.  Fall with fracture of great toe.  Incidental lung nodule.    Review of Systems: Unable to perform  Ambulatory Status:  Ambulates without assistance  PMH, PSH,  SH, and FH reviewed in Epic but unable to review with patient  Past Medical History:  Diagnosis Date  . Anxiety   . Hypertension   . Pneumonia   . Schizophrenia [facility])     Past Surgical History:  Procedure Laterality Date  . CARDIAC CATHETERIZATION N/A 02/10/2015   Procedure: Left Heart Cath and Coronary Angiography;  Surgeon: Orpah Cobb, MD;  Location: [facility] INVASIVE CV LAB;  Service: Cardiovascular;  Laterality: N/A;    Social History   Socioeconomic History  . Marital status: Single    Spouse name: Not on file  . Number of children: Not on file  . Years of education: Not on file  . Highest education level: Not on file  Social Needs  . Financial resource strain: Not on file  . Food insecurity - worry: Not on file  . Food insecurity - inability: Not on file  . Transportation needs - medical: Not on file  . Transportation needs - non-medical: Not on file  Occupational History  . Not on file  Tobacco Use  . Smoking status: Current Every Day Smoker    Packs/day: 1.50    Years: 62.00    Pack years: 93.00    Types: Cigarettes  . Smokeless tobacco: Never Used  Substance and Sexual Activity  . Alcohol use: No  . Drug use: No  . Sexual activity: Not on file  Other Topics Concern  . Not on file  Social History Narrative  . Not  on file    Allergies  Allergen Reactions  . Penicillins Anxiety    Has patient had a PCN reaction causing immediate rash, facial/tongue/throat swelling, SOB or lightheadedness with hypotension: Yes Has patient had a PCN reaction causing severe rash involving mucus membranes or skin necrosis: No Has patient had a PCN reaction that required hospitalization No Has patient had a PCN reaction occurring within the last 10 years: No If all of the above answers are "NO", then may proceed with Cephalosporin use.      History reviewed. No pertinent family history.  Prior to Admission medications   Medication Sig Start Date End Date Taking?  Authorizing Provider  amantadine (SYMMETREL) 100 MG capsule Take 100 mg by mouth 3 (three) times daily. 01/21/15  Yes [provider]    Physical Exam: Vitals:   03/24/17 1630 03/24/17 1700 03/24/17 1730 03/24/17 1900  BP: (!) 144/84 (!) 143/83 108/83 (!) 161/93  Pulse: 91 89 (!) 101 98  Resp: Temp:      TempSrc:      SpO2: 96% 94% 96% 96%  Weight:      Height:         General: Appears calm and comfortable and is NAD.  He was quite happy to see me and seemed to think that we knew each other. Eyes:  PERRL, EOMI, normal lids, iris ENT:  grossly normal hearing, lips & tongue, mmm Neck: no LAD, masses or thyromegaly; he was reluctant to have his neck examined Cardiovascular:  RRR, no m/r/g. No LE edema.  Respiratory:   CTA bilaterally with no wheezes/rales/rhonchi.  Normal respiratory effort. Abdomen:  soft, NT, ND, NABS Skin:  Scattered excoriations from falling Musculoskeletal:  grossly normal tone BUE/BLE, good ROM, no bony abnormality, some discomfort with palpation of left midfoot Psychiatric: Pleasantly confused, wished me a [place] Christmas, speech fluent but inappropriate Neurologic:  CN 2-12 grossly intact, moves all extremities in coordinated fashion, sensation intact - limited by patient's understanding of directions and ability to participate    Radiological Exams on Admission: Ct Abdomen Pelvis Wo Contrast  Result Date: 03/24/2017 CLINICAL DATA:  Blunt abdominal trauma.  Right leg pain. EXAM: CT CHEST, ABDOMEN AND PELVIS WITHOUT CONTRAST TECHNIQUE: Multidetector CT imaging of the chest, abdomen and pelvis was performed following the standard protocol without IV contrast. COMPARISON:  Chest x-ray 06/22/2015 FINDINGS: CT CHEST FINDINGS Cardiovascular: Normal heart size. No pericardial effusion. Atherosclerotic calcification seen along the LAD. Mediastinum/Nodes: Negative for hematoma. No acute vascular finding. Lungs/Pleura: No hemothorax, pneumothorax,  or lung contusion. Centrilobular emphysema. Few areas of mild interstitial coarsening which may reflect scarring. No consolidating pneumonia. 4 mm pulmonary nodule in the left lower lobe, 3:95. Musculoskeletal: No acute finding CT ABDOMEN PELVIS FINDINGS Hepatobiliary: No evidence of injury. There is a subcentimeter low-density in the central liver that is too small for densitometry. Fat deposition along the lower falciform ligament. Numerous gallstones. No evidence of cholecystitis. Pancreas: Unremarkable Spleen: Unremarkable. Adrenals/Urinary Tract: Negative adrenals. No hydronephrosis or evidence of renal injury. Urinary bladder is moderately distended. Stomach/Bowel: The colon is distended by of stool or gas throughout most of its length. Uncomplicated distal duodenal diverticula. No evidence of bowel injury. Vascular/Lymphatic: Mild for age atherosclerotic calcification. No retroperitoneal hematoma. No incidental adenopathy. Reproductive: Negative Other: No ascites or pneumoperitoneum. Musculoskeletal: Spondylosis. A lucency through a left L3 inferior endplate spur has a chronic corticated appearance. No acute fracture. Chronic L3 pars defects. IMPRESSION: 1. No evidence of acute injury  to the chest, abdomen, or pelvis. 2. Centrilobular emphysema. 4 mm left lower lobe pulmonary nodule. No follow-up needed if patient is low-risk. Non-contrast chest CT can be considered in 12 months if patient is high-risk. This recommendation follows the consensus statement: Guidelines for Management of Incidental Pulmonary Nodules Detected on CT Images: From the Fleischner Society 2017; Radiology 2017; 284:228-243. 3. Moderately distended urinary bladder. 4. Cholelithiasis. 5. Chronic L3 pars defects. Electronically Signed   By: Marnee Spring M.D.   On: 03/24/2017 17:48   Dg Ankle Complete Left  Result Date: 03/24/2017 CLINICAL DATA:  Fall.  Painful left ankle. EXAM: LEFT ANKLE COMPLETE - 3+ VIEW COMPARISON:  None.  FINDINGS: No fracture.  No bone lesion. The ankle joint is normally spaced and aligned. No significant arthropathic change. There is lateral soft tissue swelling. IMPRESSION: No fracture or dislocation Electronically Signed   By: Amie Portland M.D.   On: 03/24/2017 16:44   Ct Head Wo Contrast  Result Date: 03/24/2017 CLINICAL DATA:  Niece reports pt fell down the steps a few hours ago. May be off of psychiatric medications. Unable to obtain clear story from pt. Niece states he was c/o his right leg hurting. Patient states he is hearing voices asking him what he did last night EXAM: CT HEAD WITHOUT CONTRAST CT CERVICAL SPINE WITHOUT CONTRAST TECHNIQUE: Multidetector CT imaging of the head and cervical spine was performed following the standard protocol without intravenous contrast. Multiplanar CT image reconstructions of the cervical spine were also generated. COMPARISON:  06/19/2015 FINDINGS: CT HEAD FINDINGS Brain: No evidence of acute infarction, hemorrhage, hydrocephalus, extra-axial collection or mass lesion/mass effect. Patchy areas of white matter hypoattenuation noted consistent with mild chronic microvascular ischemic change. Vascular: No hyperdense vessel or unexpected calcification. Skull: Normal. Negative for fracture or focal lesion. Sinuses/Orbits: Globes and orbits are unremarkable. Sinuses and mastoid air cells are clear. Other: None. CT CERVICAL SPINE FINDINGS Alignment: Normal. Skull base and vertebrae: No acute fracture. No primary bone lesion or focal pathologic process. Soft tissues and spinal canal: No prevertebral fluid or swelling. No visible canal hematoma. No soft tissue masses or adenopathy. Disc levels: Minor disc bulging with endplate spurring at C3-C4, C4-C5 and C6-C7. No convincing disc herniation or significant stenosis. Mild facet degenerative change on the left at C4-C5. Upper chest: Unremarkable. Other: None. IMPRESSION: HEAD CT 1. No acute intracranial abnormalities. 2. Mild  chronic microvascular change. CERVICAL CT 1. No fracture or acute finding. Electronically Signed   By: Amie Portland M.D.   On: 03/24/2017 17:39   Ct Chest Wo Contrast  Result Date: 03/24/2017 CLINICAL DATA:  Blunt abdominal trauma.  Right leg pain. EXAM: CT CHEST, ABDOMEN AND PELVIS WITHOUT CONTRAST TECHNIQUE: Multidetector CT imaging of the chest, abdomen and pelvis was performed following the standard protocol without IV contrast. COMPARISON:  Chest x-ray 06/22/2015 FINDINGS: CT CHEST FINDINGS Cardiovascular: Normal heart size. No pericardial effusion. Atherosclerotic calcification seen along the LAD. Mediastinum/Nodes: Negative for hematoma. No acute vascular finding. Lungs/Pleura: No hemothorax, pneumothorax, or lung contusion. Centrilobular emphysema. Few areas of mild interstitial coarsening which may reflect scarring. No consolidating pneumonia. 4 mm pulmonary nodule in the left lower lobe, 3:95. Musculoskeletal: No acute finding CT ABDOMEN PELVIS FINDINGS Hepatobiliary: No evidence of injury. There is a subcentimeter low-density in the central liver that is too small for densitometry. Fat deposition along the lower falciform ligament. Numerous gallstones. No evidence of cholecystitis. Pancreas: Unremarkable Spleen: Unremarkable. Adrenals/Urinary Tract: Negative adrenals. No hydronephrosis or evidence of renal injury.  Urinary bladder is moderately distended. Stomach/Bowel: The colon is distended by of stool or gas throughout most of its length. Uncomplicated distal duodenal diverticula. No evidence of bowel injury. Vascular/Lymphatic: Mild for age atherosclerotic calcification. No retroperitoneal hematoma. No incidental adenopathy. Reproductive: Negative Other: No ascites or pneumoperitoneum. Musculoskeletal: Spondylosis. A lucency through a left L3 inferior endplate spur has a chronic corticated appearance. No acute fracture. Chronic L3 pars defects. IMPRESSION: 1. No evidence of acute injury to the  chest, abdomen, or pelvis. 2. Centrilobular emphysema. 4 mm left lower lobe pulmonary nodule. No follow-up needed if patient is low-risk. Non-contrast chest CT can be considered in 12 months if patient is high-risk. This recommendation follows the consensus statement: Guidelines for Management of Incidental Pulmonary Nodules Detected on CT Images: From the Fleischner Society 2017; Radiology 2017; 284:228-243. 3. Moderately distended urinary bladder. 4. Cholelithiasis. 5. Chronic L3 pars defects. Electronically Signed   By: Marnee Spring M.D.   On: 03/24/2017 17:48   Ct Cervical Spine Wo Contrast  Result Date: 03/24/2017 CLINICAL DATA:  Niece reports pt fell down the steps a few hours ago. May be off of psychiatric medications. Unable to obtain clear story from pt. Niece states he was c/o his right leg hurting. Patient states he is hearing voices asking him what he did last night EXAM: CT HEAD WITHOUT CONTRAST CT CERVICAL SPINE WITHOUT CONTRAST TECHNIQUE: Multidetector CT imaging of the head and cervical spine was performed following the standard protocol without intravenous contrast. Multiplanar CT image reconstructions of the cervical spine were also generated. COMPARISON:  06/19/2015 FINDINGS: CT HEAD FINDINGS Brain: No evidence of acute infarction, hemorrhage, hydrocephalus, extra-axial collection or mass lesion/mass effect. Patchy areas of white matter hypoattenuation noted consistent with mild chronic microvascular ischemic change. Vascular: No hyperdense vessel or unexpected calcification. Skull: Normal. Negative for fracture or focal lesion. Sinuses/Orbits: Globes and orbits are unremarkable. Sinuses and mastoid air cells are clear. Other: None. CT CERVICAL SPINE FINDINGS Alignment: Normal. Skull base and vertebrae: No acute fracture. No primary bone lesion or focal pathologic process. Soft tissues and spinal canal: No prevertebral fluid or swelling. No visible canal hematoma. No soft tissue masses or  adenopathy. Disc levels: Minor disc bulging with endplate spurring at C3-C4, C4-C5 and C6-C7. No convincing disc herniation or significant stenosis. Mild facet degenerative change on the left at C4-C5. Upper chest: Unremarkable. Other: None. IMPRESSION: HEAD CT 1. No acute intracranial abnormalities. 2. Mild chronic microvascular change. CERVICAL CT 1. No fracture or acute finding. Electronically Signed   By: Amie Portland M.D.   On: 03/24/2017 17:39   Dg Foot 2 Views Left  Result Date: 03/24/2017 CLINICAL DATA:  Fall.  Left ankle/ foot pain. EXAM: LEFT FOOT - 2 VIEW COMPARISON:  None. FINDINGS: There is a nondisplaced fracture at the base of the distal phalanx of the great toe. Fractures seen across the medial base. Possible additional transverse fracture extending to the lateral base. A portion of the fracture involves the articular surface. No other evidence of a fracture. The joints are normally spaced and aligned. Small plantar calcaneal spur. Soft tissues are unremarkable. IMPRESSION: 1. Nondisplaced fracture at the base of the left great toe proximal phalanx. Electronically Signed   By: Amie Portland M.D.   On: 03/24/2017 16:46    EKG: Independently reviewed.  Sinus tachycardia with rate 102; Incomplete RBBB and LAFB with no evidence of acute ischemia   Labs on Admission: I have personally reviewed the available labs and imaging studies at the  time of the admission.  Pertinent labs:   K+ 5.6 CO2 19 Glucose 116 BUN 51/Creatinine 4.15/GFR 15; 32/2.21/33 in 3/17 AST 44/ALT 11 Troponin 0.03 Hgb 11.3 - improved since 3/17 UA small Hgb, rare bacteria UDS negative Blood cultures pending ETOH <10  Assessment/Plan Principal Problem:   Acute renal failure (ARF) (HCC) Active Problems:   Tobacco abuse   Chronic diastolic CHF (congestive heart failure) (HCC)   Schizophrenia (HCC)   Fall at home, initial encounter   Lung nodule   Hyperkalemia   Hyperglycemia   Anemia of chronic  disease   Acute renal failure on CKD -Uncertain current baseline creatinine since last labwork in Epic was in 2017, but creatinine today is markedly elevated since then (2.21 to 4.15) -Likely due to prerenal failure secondary to dehydration and possible NSAID use (patient mentioned using BC powders). -Will admit since this problem is likely to take longer than 2 midnights to improve. -IVF at 125 cc/hr after boluses given in the ER -Has a h/o grade 1 diastolic heart failure (Echo 06/20/15) so will be somewhat judicious in rehydrating him -Check FeNa -US-renal -Follow up renal function by BMP -Avoid ACEI and NSAIDs and other nephrotoxic medications  Hyperkalemia -Mild -Likely resulting from renal failure -Given Kayexalate in the ER -Anticipate further resolution with IVF -Monitor on telemetry for now -Recheck BMP in AM  Fall -Nondisplaced fracture at the base of the left great toe proximal phalanx. -No other obvious injuries on exam or imaging -Hard shoe for left foot.  Schizophrenia -Apparently chronically decompensated -Previously on injectable Invega - uncertain about compliance -Amantadine is his only listed medication on MAR and this is likely to combat extrapyramidal symptoms of Invega; again, compliance is uncertain  Anemia of chronic disease -Appears to be stable -Will follow with repeat CBC in AM  Lung nodule -4 mm left lower lobe pulmonary nodule.  -Non-contrast chest CT in 12 months since patient is high-risk.  Hyperglycemia -May be stress response -Will follow with fasting AM labs  Tobacco dependence -Encourage cessation.   -Patch ordered.  DVT prophylaxis:  Lovenox Code Status: Full - confirmed with prior admissions Family Communication: None present Disposition Plan:  Home once clinically improved Consults called: None  Admission status: Admit - It is my clinical opinion that admission to INPATIENT is reasonable and necessary because this patient will  require at least 2 midnights in the hospital to treat this condition based on the medical complexity of the problems presented.  Given the aforementioned information, the predictability of an adverse outcome is felt to be significant.    Jonah BlueJennifer Dutchess Crosland MD [facility]  If note is complete, please contact covering daytime or nighttime physician. [email or web address] Password [facility] [email or web address]  03/24/2017, 7:45 PM

## 2017-03-24 NOTE — ED Triage Notes (Signed)
Niece states pt has not slept all night and has been talking "out of his head" since yesterday.

## 2017-03-24 NOTE — ED Notes (Signed)
CRITICAL VALUE ALERT  Critical Value:  Troponin .03   Date & Time Notied:  03-24-17 16:20  Provider Notified: Duwayne Heckanielle Ray,MD  Orders Received/Actions taken: will continue to monitor.

## 2017-03-24 NOTE — ED Triage Notes (Signed)
Niece reports pt fell down the steps a few hours ago.  May be off of psychiatric medications.  Unable to obtain clear story from pt.  Niece states he was c/o his right leg hurting.

## 2017-03-24 NOTE — ED Notes (Signed)
Lab at the bedside, family in the room

## 2017-03-24 NOTE — ED Notes (Signed)
Pt asking me if I hear voices, " I am hearing voices, they are asking me what I did last night"

## 2017-03-24 NOTE — ED Notes (Signed)
Pt back from CT

## 2017-03-25 ENCOUNTER — Inpatient Hospital Stay (HOSPITAL_COMMUNITY): Payer: Medicare Other

## 2017-03-25 DIAGNOSIS — I10 Essential (primary) hypertension: Secondary | ICD-10-CM

## 2017-03-25 DIAGNOSIS — D631 Anemia in chronic kidney disease: Secondary | ICD-10-CM

## 2017-03-25 DIAGNOSIS — N183 Chronic kidney disease, stage 3 (moderate): Secondary | ICD-10-CM

## 2017-03-25 DIAGNOSIS — N179 Acute kidney failure, unspecified: Secondary | ICD-10-CM

## 2017-03-25 DIAGNOSIS — G9341 Metabolic encephalopathy: Secondary | ICD-10-CM

## 2017-03-25 LAB — CBC
HEMATOCRIT: 35.4 % — AB (ref 39.0–52.0)
Hemoglobin: 11 g/dL — ABNORMAL LOW (ref 13.0–17.0)
MCH: 30.1 pg (ref 26.0–34.0)
MCHC: 31.1 g/dL (ref 30.0–36.0)
MCV: 96.7 fL (ref 78.0–100.0)
PLATELETS: 265 10*3/uL (ref 150–400)
RBC: 3.66 MIL/uL — ABNORMAL LOW (ref 4.22–5.81)
RDW: 12.9 % (ref 11.5–15.5)
WBC: 8.6 10*3/uL (ref 4.0–10.5)

## 2017-03-25 LAB — BASIC METABOLIC PANEL
Anion gap: 14 (ref 5–15)
BUN: 46 mg/dL — AB (ref 6–20)
CHLORIDE: 105 mmol/L (ref 101–111)
CO2: 17 mmol/L — AB (ref 22–32)
CREATININE: 2.87 mg/dL — AB (ref 0.61–1.24)
Calcium: 8.7 mg/dL — ABNORMAL LOW (ref 8.9–10.3)
GFR calc Af Amer: 24 mL/min — ABNORMAL LOW (ref >60)
GFR calc non Af Amer: 21 mL/min — ABNORMAL LOW (ref >60)
GLUCOSE: 70 mg/dL (ref 65–99)
POTASSIUM: 3.9 mmol/L (ref 3.5–5.1)
SODIUM: 136 mmol/L (ref 135–145)

## 2017-03-25 LAB — PROTIME-INR
INR: 1.16
Prothrombin Time: 14.7 seconds (ref 11.4–15.2)

## 2017-03-25 LAB — IRON AND TIBC
IRON: 30 ug/dL — AB (ref 45–182)
SATURATION RATIOS: 11 % — AB (ref 17.9–39.5)
TIBC: 272 ug/dL (ref 250–450)
UIBC: 242 ug/dL

## 2017-03-25 LAB — APTT: aPTT: 47 seconds — ABNORMAL HIGH (ref 24–36)

## 2017-03-25 LAB — TSH: TSH: 0.398 u[IU]/mL (ref 0.350–4.500)

## 2017-03-25 LAB — FERRITIN: Ferritin: 153 ng/mL (ref 24–336)

## 2017-03-25 LAB — VITAMIN B12: Vitamin B-12: 1133 pg/mL — ABNORMAL HIGH (ref 180–914)

## 2017-03-25 MED ORDER — HALOPERIDOL LACTATE 5 MG/ML IJ SOLN
5.0000 mg | Freq: Four times a day (QID) | INTRAMUSCULAR | Status: DC | PRN
  Administered 2017-03-25 – 2017-03-26 (×3): 5 mg via INTRAVENOUS
  Filled 2017-03-25 (×3): qty 1

## 2017-03-25 MED ORDER — LORAZEPAM 2 MG/ML IJ SOLN
1.0000 mg | Freq: Once | INTRAMUSCULAR | Status: AC
  Administered 2017-03-25: 1 mg via INTRAVENOUS
  Filled 2017-03-25: qty 1

## 2017-03-25 MED ORDER — LORAZEPAM 2 MG/ML IJ SOLN
1.0000 mg | INTRAMUSCULAR | Status: DC | PRN
  Administered 2017-03-25 – 2017-03-26 (×3): 1 mg via INTRAVENOUS
  Filled 2017-03-25 (×3): qty 1

## 2017-03-25 MED ORDER — HALOPERIDOL LACTATE 5 MG/ML IJ SOLN
5.0000 mg | Freq: Once | INTRAMUSCULAR | Status: AC
  Administered 2017-03-25: 5 mg via INTRAVENOUS
  Filled 2017-03-25: qty 1

## 2017-03-25 NOTE — Progress Notes (Addendum)
Patient is continually trying to jump out of bed and is fighting with the sitter trying to get up.  Bladder scanned him and he was retaining about 400 mls. Paged MD, and received PRN ativan orders.

## 2017-03-25 NOTE — Patient Care Conference (Signed)
Pt. Pulled foley catheter out, with balloon still  Inflated. Blood to opening of penis. Pt alert and oriented to self, place, not oriented to situation or time. Condom catheter applied. Bed in lowest position, call light within reach. Bed alarm in place. Will continue to monitor.

## 2017-03-25 NOTE — Progress Notes (Signed)
Patient is very confused, continually thinks his IV pole is  Dog that is attacking him. I have reoriented him multiple times and gave him IV haldol per MD order.

## 2017-03-25 NOTE — Care Management Important Message (Signed)
Important Message  Patient Details  Name: Jared Austin MRN: [id number] Date of Birth: 25-Apr-1946   Medicare Important Message Given:  Yes    Malcolm MetroChildress, Taris Galindo Demske, RN 03/25/2017, 2:08 PM

## 2017-03-25 NOTE — Progress Notes (Signed)
PROGRESS NOTE  Jared Austin EAV:[id number] DOB: 19-Jul-1945 DOA: 03/24/2017 PCP: Avon GullyFanta, Tesfaye, MD  Brief History:  71 year old male with a history of hypertension and schizophrenia presents with one-week history of confusion and agitation.  The patient was brought to the hospital after he had a mechanical fall walking down 10 stairs.  The patient is a poor historian secondary to his mental status.  History is obtained from review of the medical record and speaking with the patient's brother who is his primary caretaker.  There is been no reports fevers, chills, headaches, chest pain, coughing, nausea, vomiting, diarrhea, abdominal pain.  The patient has occasional dyspnea on exertion but this appears related to his mood according to his brother.  In addition, the patient takes 2-3 "BCs" on a daily basis for at least the past year.  His brother states that there are no other new prescription medications.  His brother also states that the patient takes Benadryl  for sleep.  He continues to smoke tobacco 1-1/2 pack/day but there is no report of alcohol or illegal drug use.  His brother states that his last InVega injection was on March 17, 2017. In the emergency department, the patient was hemodynamically stable saturating 100% room air.  He had low-grade temperature 100.70F.  X-ray of his left foot revealed a nondisplaced fracture of the left proximal phalanx of his great toe.  CT of the abdomen and pelvis showed cholelithiasis without cholecystitis.  The colon was distended with stool and gas.  There is also a moderately distended urinary bladder.  A Foley catheter was placed due to his acute on chronic renal failure when he presented with a serum creatinine of 4.15.  Assessment/Plan: Acute metabolic encephalopathy -Likely secondary to acute on chronic renal failure -Check TSH -Serum B12 -RPR -03/24/17 UA--neg for pyuria  Acute on chronic renal failure--CKD stage III -Baseline  creatinine 1.8-2.0 -Likely NSAID induced with a degree of volume depletion -Continue IV fluids -Renal ultrasound -FeNa = 0.58%  Hematuria -due to patient pulling out foley catheter with ballon intact -monitor clinically for now -brother states he has done this in the past  Hyperkalemia -Repeat BMP after IV fluids overnight -Personally reviewed EKG--RSR', LAFB--unchanged from prior EKGs  Tobacco abuse/COPD -Tobacco cessation discussed -NicoDerm patch -Personally reviewed CT chest--no infiltrates or edema.  Emphysema noted  Schizophrenia -Last received Invega 03/17/17 per family -continue amantidine -Haldol prn agitation  Chronic diastolic CHF -Clinically euvolemic -June 20, 2015 Echo EF 55-60%, grade 1 DD, PAS P 41  Lung nodule -LLL nodule incidental finding on CT chest -Outpatient surveillance  Anemia of CKD -Baseline hemoglobin ~10 -Check iron studies  Hyperglycemia -Hemoglobin A1c    Disposition Plan:   Home in 2-3 days  Family Communication:   Brother updated on phone--Total time spent 40 minutes.  Greater than 50% spent face to face counseling and coordinating care.  Consultants:  none  Code Status:  FULL   DVT Prophylaxis:  SCDs   Procedures: As Listed in Progress Note Above  Antibiotics: None    Subjective: Patient is pleasantly confused but able to identify the president and the month and place.  He denies any fevers, chills, chest pain, shortness breath, abdominal pain, vomiting.  Remainder review of systems unobtainable.  Objective: Vitals:   03/24/17 2000 03/24/17 2030 03/24/17 2123 03/25/17 0300  BP: (!) 170/102 119/89 (!) 164/91 (!) 154/74  Pulse:   93 91  Resp: 19 17 20 19   Temp:  98.8 F (37.1 C) 98.4 F (36.9 C)  TempSrc:   Oral Oral  SpO2: 96%  100% 99%  Weight:   58.7 kg (129 lb 4.8 oz)   Height:   5\' 6"  (1.676 m)     Intake/Output Summary (Last 24 hours) at 03/25/2017 0721 Last data filed at 03/24/2017 1959 Gross  per 24 hour  Intake 1000 ml  Output 600 ml  Net 400 ml   Weight change:  Exam:   General:  Pt is alert, follows commands appropriately, not in acute distress  HEENT: No icterus, No thrush, No neck mass, [place]/AT  Cardiovascular: RRR, S1/S2, no rubs, no gallops  Respiratory: diminished breath sounds at the bases without wheezing  Abdomen: Soft/+BS, non tender, non distended, no guarding  Extremities: No edema, No lymphangitis, No petechiae, No rashes, no synovitis   Data Reviewed: I have personally reviewed following labs and imaging studies Basic Metabolic Panel: Recent Labs  Lab 03/24/17 1451  NA 137  K 5.6*  CL 106  CO2 19*  GLUCOSE 116*  BUN 51*  CREATININE 4.15*  CALCIUM 9.6   Liver Function Tests: Recent Labs  Lab 03/24/17 1451  AST 44*  ALT 11*  ALKPHOS 70  BILITOT 1.0  PROT 7.2  ALBUMIN 4.4   No results for input(s): LIPASE, AMYLASE in the last 168 hours. No results for input(s): AMMONIA in the last 168 hours. Coagulation Profile: No results for input(s): INR, PROTIME in the last 168 hours. CBC: Recent Labs  Lab 03/24/17 1451  WBC 9.9  NEUTROABS 8.7*  HGB 11.3*  HCT 36.1*  MCV 95.0  PLT 311   Cardiac Enzymes: Recent Labs  Lab 03/24/17 1451  TROPONINI 0.03*   BNP: Invalid input(s): POCBNP CBG: No results for input(s): GLUCAP in the last 168 hours. HbA1C: No results for input(s): HGBA1C in the last 72 hours. Urine analysis:    Component Value Date/Time   COLORURINE YELLOW 03/24/2017 1756   APPEARANCEUR CLEAR 03/24/2017 1756   LABSPEC 1.014 03/24/2017 1756   PHURINE 5.0 03/24/2017 1756   GLUCOSEU NEGATIVE 03/24/2017 1756   HGBUR SMALL (A) 03/24/2017 1756   BILIRUBINUR NEGATIVE 03/24/2017 1756   KETONESUR NEGATIVE 03/24/2017 1756   PROTEINUR NEGATIVE 03/24/2017 1756   NITRITE NEGATIVE 03/24/2017 1756   LEUKOCYTESUR NEGATIVE 03/24/2017 1756   Sepsis Labs: @LABRCNTIP (procalcitonin:4,lacticidven:4) ) Recent Results (from the  past 240 hour(s))  Blood culture (routine x 2)     Status: None (Preliminary result)   Collection Time: 03/24/17  2:51 PM  Result Value Ref Range Status   Specimen Description BLOOD RIGHT FOREARM  Final   Special Requests   Final    BOTTLES DRAWN AEROBIC AND ANAEROBIC Blood Culture adequate volume   Culture NO GROWTH < 24 HOURS  Final   Report Status PENDING  Incomplete  Blood culture (routine x 2)     Status: None (Preliminary result)   Collection Time: 03/24/17  2:51 PM  Result Value Ref Range Status   Specimen Description BLOOD LEFT HAND  Final   Special Requests   Final    BOTTLES DRAWN AEROBIC AND ANAEROBIC Blood Culture adequate volume   Culture NO GROWTH < 24 HOURS  Final   Report Status PENDING  Incomplete     Scheduled Meds: . amantadine  100 mg Oral TID  . docusate sodium  100 mg Oral BID  . enoxaparin (LOVENOX) injection  30 mg Subcutaneous Q24H  . nicotine  21 mg Transdermal Daily  . sodium chloride flush  3 mL Intravenous Q12H   Continuous Infusions: . lactated ringers 125 mL/hr at 03/24/17 2238  . sodium chloride      Procedures/Studies: Ct Abdomen Pelvis Wo Contrast  Result Date: 03/24/2017 CLINICAL DATA:  Blunt abdominal trauma.  Right leg pain. EXAM: CT CHEST, ABDOMEN AND PELVIS WITHOUT CONTRAST TECHNIQUE: Multidetector CT imaging of the chest, abdomen and pelvis was performed following the standard protocol without IV contrast. COMPARISON:  Chest x-ray 06/22/2015 FINDINGS: CT CHEST FINDINGS Cardiovascular: Normal heart size. No pericardial effusion. Atherosclerotic calcification seen along the LAD. Mediastinum/Nodes: Negative for hematoma. No acute vascular finding. Lungs/Pleura: No hemothorax, pneumothorax, or lung contusion. Centrilobular emphysema. Few areas of mild interstitial coarsening which may reflect scarring. No consolidating pneumonia. 4 mm pulmonary nodule in the left lower lobe, 3:95. Musculoskeletal: No acute finding CT ABDOMEN PELVIS FINDINGS  Hepatobiliary: No evidence of injury. There is a subcentimeter low-density in the central liver that is too small for densitometry. Fat deposition along the lower falciform ligament. Numerous gallstones. No evidence of cholecystitis. Pancreas: Unremarkable Spleen: Unremarkable. Adrenals/Urinary Tract: Negative adrenals. No hydronephrosis or evidence of renal injury. Urinary bladder is moderately distended. Stomach/Bowel: The colon is distended by of stool or gas throughout most of its length. Uncomplicated distal duodenal diverticula. No evidence of bowel injury. Vascular/Lymphatic: Mild for age atherosclerotic calcification. No retroperitoneal hematoma. No incidental adenopathy. Reproductive: Negative Other: No ascites or pneumoperitoneum. Musculoskeletal: Spondylosis. A lucency through a left L3 inferior endplate spur has a chronic corticated appearance. No acute fracture. Chronic L3 pars defects. IMPRESSION: 1. No evidence of acute injury to the chest, abdomen, or pelvis. 2. Centrilobular emphysema. 4 mm left lower lobe pulmonary nodule. No follow-up needed if patient is low-risk. Non-contrast chest CT can be considered in 12 months if patient is high-risk. This recommendation follows the consensus statement: Guidelines for Management of Incidental Pulmonary Nodules Detected on CT Images: From the Fleischner Society 2017; Radiology 2017; 284:228-243. 3. Moderately distended urinary bladder. 4. Cholelithiasis. 5. Chronic L3 pars defects. Electronically Signed   By: Marnee Spring M.D.   On: 03/24/2017 17:48   Dg Ankle Complete Left  Result Date: 03/24/2017 CLINICAL DATA:  Fall.  Painful left ankle. EXAM: LEFT ANKLE COMPLETE - 3+ VIEW COMPARISON:  None. FINDINGS: No fracture.  No bone lesion. The ankle joint is normally spaced and aligned. No significant arthropathic change. There is lateral soft tissue swelling. IMPRESSION: No fracture or dislocation Electronically Signed   By: Amie Portland M.D.   On:  03/24/2017 16:44   Ct Head Wo Contrast  Result Date: 03/24/2017 CLINICAL DATA:  Niece reports pt fell down the steps a few hours ago. May be off of psychiatric medications. Unable to obtain clear story from pt. Niece states he was c/o his right leg hurting. Patient states he is hearing voices asking him what he did last night EXAM: CT HEAD WITHOUT CONTRAST CT CERVICAL SPINE WITHOUT CONTRAST TECHNIQUE: Multidetector CT imaging of the head and cervical spine was performed following the standard protocol without intravenous contrast. Multiplanar CT image reconstructions of the cervical spine were also generated. COMPARISON:  06/19/2015 FINDINGS: CT HEAD FINDINGS Brain: No evidence of acute infarction, hemorrhage, hydrocephalus, extra-axial collection or mass lesion/mass effect. Patchy areas of white matter hypoattenuation noted consistent with mild chronic microvascular ischemic change. Vascular: No hyperdense vessel or unexpected calcification. Skull: Normal. Negative for fracture or focal lesion. Sinuses/Orbits: Globes and orbits are unremarkable. Sinuses and mastoid air cells are clear. Other: None. CT CERVICAL SPINE FINDINGS Alignment: Normal. Skull  base and vertebrae: No acute fracture. No primary bone lesion or focal pathologic process. Soft tissues and spinal canal: No prevertebral fluid or swelling. No visible canal hematoma. No soft tissue masses or adenopathy. Disc levels: Minor disc bulging with endplate spurring at C3-C4, C4-C5 and C6-C7. No convincing disc herniation or significant stenosis. Mild facet degenerative change on the left at C4-C5. Upper chest: Unremarkable. Other: None. IMPRESSION: HEAD CT 1. No acute intracranial abnormalities. 2. Mild chronic microvascular change. CERVICAL CT 1. No fracture or acute finding. Electronically Signed   By: Amie Portland M.D.   On: 03/24/2017 17:39   Ct Chest Wo Contrast  Result Date: 03/24/2017 CLINICAL DATA:  Blunt abdominal trauma.  Right leg pain.  EXAM: CT CHEST, ABDOMEN AND PELVIS WITHOUT CONTRAST TECHNIQUE: Multidetector CT imaging of the chest, abdomen and pelvis was performed following the standard protocol without IV contrast. COMPARISON:  Chest x-ray 06/22/2015 FINDINGS: CT CHEST FINDINGS Cardiovascular: Normal heart size. No pericardial effusion. Atherosclerotic calcification seen along the LAD. Mediastinum/Nodes: Negative for hematoma. No acute vascular finding. Lungs/Pleura: No hemothorax, pneumothorax, or lung contusion. Centrilobular emphysema. Few areas of mild interstitial coarsening which may reflect scarring. No consolidating pneumonia. 4 mm pulmonary nodule in the left lower lobe, 3:95. Musculoskeletal: No acute finding CT ABDOMEN PELVIS FINDINGS Hepatobiliary: No evidence of injury. There is a subcentimeter low-density in the central liver that is too small for densitometry. Fat deposition along the lower falciform ligament. Numerous gallstones. No evidence of cholecystitis. Pancreas: Unremarkable Spleen: Unremarkable. Adrenals/Urinary Tract: Negative adrenals. No hydronephrosis or evidence of renal injury. Urinary bladder is moderately distended. Stomach/Bowel: The colon is distended by of stool or gas throughout most of its length. Uncomplicated distal duodenal diverticula. No evidence of bowel injury. Vascular/Lymphatic: Mild for age atherosclerotic calcification. No retroperitoneal hematoma. No incidental adenopathy. Reproductive: Negative Other: No ascites or pneumoperitoneum. Musculoskeletal: Spondylosis. A lucency through a left L3 inferior endplate spur has a chronic corticated appearance. No acute fracture. Chronic L3 pars defects. IMPRESSION: 1. No evidence of acute injury to the chest, abdomen, or pelvis. 2. Centrilobular emphysema. 4 mm left lower lobe pulmonary nodule. No follow-up needed if patient is low-risk. Non-contrast chest CT can be considered in 12 months if patient is high-risk. This recommendation follows the consensus  statement: Guidelines for Management of Incidental Pulmonary Nodules Detected on CT Images: From the Fleischner Society 2017; Radiology 2017; 284:228-243. 3. Moderately distended urinary bladder. 4. Cholelithiasis. 5. Chronic L3 pars defects. Electronically Signed   By: Marnee Spring M.D.   On: 03/24/2017 17:48   Ct Cervical Spine Wo Contrast  Result Date: 03/24/2017 CLINICAL DATA:  Niece reports pt fell down the steps a few hours ago. May be off of psychiatric medications. Unable to obtain clear story from pt. Niece states he was c/o his right leg hurting. Patient states he is hearing voices asking him what he did last night EXAM: CT HEAD WITHOUT CONTRAST CT CERVICAL SPINE WITHOUT CONTRAST TECHNIQUE: Multidetector CT imaging of the head and cervical spine was performed following the standard protocol without intravenous contrast. Multiplanar CT image reconstructions of the cervical spine were also generated. COMPARISON:  06/19/2015 FINDINGS: CT HEAD FINDINGS Brain: No evidence of acute infarction, hemorrhage, hydrocephalus, extra-axial collection or mass lesion/mass effect. Patchy areas of white matter hypoattenuation noted consistent with mild chronic microvascular ischemic change. Vascular: No hyperdense vessel or unexpected calcification. Skull: Normal. Negative for fracture or focal lesion. Sinuses/Orbits: Globes and orbits are unremarkable. Sinuses and mastoid air cells are clear. Other: None.  CT CERVICAL SPINE FINDINGS Alignment: Normal. Skull base and vertebrae: No acute fracture. No primary bone lesion or focal pathologic process. Soft tissues and spinal canal: No prevertebral fluid or swelling. No visible canal hematoma. No soft tissue masses or adenopathy. Disc levels: Minor disc bulging with endplate spurring at C3-C4, C4-C5 and C6-C7. No convincing disc herniation or significant stenosis. Mild facet degenerative change on the left at C4-C5. Upper chest: Unremarkable. Other: None. IMPRESSION: HEAD  CT 1. No acute intracranial abnormalities. 2. Mild chronic microvascular change. CERVICAL CT 1. No fracture or acute finding. Electronically Signed   By: Amie Portland M.D.   On: 03/24/2017 17:39   Dg Foot 2 Views Left  Result Date: 03/24/2017 CLINICAL DATA:  Fall.  Left ankle/ foot pain. EXAM: LEFT FOOT - 2 VIEW COMPARISON:  None. FINDINGS: There is a nondisplaced fracture at the base of the distal phalanx of the great toe. Fractures seen across the medial base. Possible additional transverse fracture extending to the lateral base. A portion of the fracture involves the articular surface. No other evidence of a fracture. The joints are normally spaced and aligned. Small plantar calcaneal spur. Soft tissues are unremarkable. IMPRESSION: 1. Nondisplaced fracture at the base of the left great toe proximal phalanx. Electronically Signed   By: Amie Portland M.D.   On: 03/24/2017 16:46    Nazar Kuan, DO  [facility] Pager [phone]  If 7PM-7AM, please contact night-coverage [email or web address] Password [email or web address] 03/25/2017, 7:21 AM   LOS: 1 day

## 2017-03-26 DIAGNOSIS — F203 Undifferentiated schizophrenia: Secondary | ICD-10-CM

## 2017-03-26 DIAGNOSIS — D631 Anemia in chronic kidney disease: Secondary | ICD-10-CM

## 2017-03-26 LAB — BASIC METABOLIC PANEL
Anion gap: 7 (ref 5–15)
BUN: 24 mg/dL — AB (ref 6–20)
CALCIUM: 8.9 mg/dL (ref 8.9–10.3)
CHLORIDE: 109 mmol/L (ref 101–111)
CO2: 22 mmol/L (ref 22–32)
CREATININE: 1.77 mg/dL — AB (ref 0.61–1.24)
GFR calc non Af Amer: 37 mL/min — ABNORMAL LOW (ref >60)
GFR, EST AFRICAN AMERICAN: 43 mL/min — AB (ref >60)
Glucose, Bld: 94 mg/dL (ref 65–99)
Potassium: 4 mmol/L (ref 3.5–5.1)
SODIUM: 138 mmol/L (ref 135–145)

## 2017-03-26 LAB — CBC
HCT: 32.5 % — ABNORMAL LOW (ref 39.0–52.0)
Hemoglobin: 10.2 g/dL — ABNORMAL LOW (ref 13.0–17.0)
MCH: 30.2 pg (ref 26.0–34.0)
MCHC: 31.4 g/dL (ref 30.0–36.0)
MCV: 96.2 fL (ref 78.0–100.0)
PLATELETS: 248 10*3/uL (ref 150–400)
RBC: 3.38 MIL/uL — AB (ref 4.22–5.81)
RDW: 12.9 % (ref 11.5–15.5)
WBC: 4.8 10*3/uL (ref 4.0–10.5)

## 2017-03-26 LAB — HIV ANTIBODY (ROUTINE TESTING W REFLEX): HIV Screen 4th Generation wRfx: NONREACTIVE

## 2017-03-26 LAB — RPR: RPR: NONREACTIVE

## 2017-03-26 MED ORDER — AMLODIPINE BESYLATE 5 MG PO TABS: 5.0000 mg | ORAL_TABLET | Freq: Every day | ORAL | 1 refills | Status: AC

## 2017-03-26 MED ORDER — FERROUS SULFATE 325 (65 FE) MG PO TABS: 325.0000 mg | ORAL_TABLET | Freq: Every day | ORAL | 3 refills | Status: AC

## 2017-03-26 MED ORDER — AMLODIPINE BESYLATE 5 MG PO TABS: 5.0000 mg | ORAL_TABLET | Freq: Every day | ORAL | Status: DC

## 2017-03-26 NOTE — Progress Notes (Addendum)
Patient discharged, IVs removed and sites intact. Patient discharged with personal belongings and prescriptions. Patient taken out by brother and nephew.

## 2017-03-26 NOTE — Discharge Summary (Signed)
Physician Discharge Summary  Jared Austin ZOX:[id number] DOB: Jan 24, 1946 DOA: 03/24/2017  PCP: Avon Gully, MD  Admit date: 03/24/2017 Discharge date: 03/26/2017  Admitted From: Home Disposition:  Home / SNF  Recommendations for Outpatient Follow-up:  1. Follow up with PCP in 1-2 weeks 2. Please obtain BMP/CBC in one week    Discharge Condition: Stable CODE STATUS: FULL Diet recommendation: Heart Healthy    Brief/Interim Summary: 71 year old male with a history of hypertension and schizophrenia presents with one-week history of confusion and agitation.  The patient was brought to the hospital after he had a mechanical fall walking down 10 stairs.  The patient is a poor historian secondary to his mental status.  History is obtained from review of the medical record and speaking with the patient's brother who is his primary caretaker.  There is been no reports fevers, chills, headaches, chest pain, coughing, nausea, vomiting, diarrhea, abdominal pain.  The patient has occasional dyspnea on exertion but this appears related to his mood according to his brother.  In addition, the patient takes 2-3 "BCs" on a daily basis for at least the past year.  His brother states that there are no other new prescription medications.  His brother also states that the patient takes Benadryl  for sleep.  He continues to smoke tobacco 1-1/2 pack/day but there is no report of alcohol or illegal drug use.  His brother states that his last InVega injection was on March 17, 2017. In the emergency department, the patient was hemodynamically stable saturating 100% room air.  He had low-grade temperature 100.109F.  X-ray of his left foot revealed a nondisplaced fracture of the left proximal phalanx of his great toe.  CT of the abdomen and pelvis showed cholelithiasis without cholecystitis.  The colon was distended with stool and gas.  There is also a moderately distended urinary bladder.  A Foley catheter was  placed due to his acute on chronic renal failure when he presented with a serum creatinine of 4.15.  The patient was started on IVF and his renal function returned to baseline.   During his hospitalization, the patient was impulsive and was a high fall risk.  The patient was given doses of Haldol and Ativan for his agitation.  On the day of discharge, his mental status returned to baseline.  His brother confirmed that at baseline, the patient is impulsive and has difficulty following directions.    Discharge Diagnoses:  Acute metabolic encephalopathy -Likely secondary to acute on chronic renal failure -Check TSH--0.398 -Serum B12--1133 -RPR--NR -03/24/17 UA--neg for pyuria -brother states pt is at baseline on day of d/c--pt is impulsive at baseline with difficulty following directions  Acute on chronic renal failure--CKD stage III -Baseline creatinine 1.8-2.0 -Likely NSAID induced with a degree of volume depletion -Continue IV fluids-->renal function returned to baseline--serum creatinine 1.77 on day of d/c -Renal ultrasound--no hydronephrosis, no bladder distension -FeNa = 0.58%  Hematuria -due to patient pulling out foley catheter with ballon intact -brother states he has done this in the past -monitored clinically-->cleared by day of d/c  Hyperkalemia -Repeat BMP after IV fluids overnight-->resolved -Personally reviewed EKG--RSR', LAFB--unchanged from prior EKGs  Hypertension -start amlodipine 5 mg daily  Tobacco abuse/COPD -Tobacco cessation discussed -NicoDerm patch -Personally reviewed CT chest--no infiltrates or edema.  Emphysema noted  Schizophrenia -Last received Invega 03/17/17 per family -continue amantidine -Haldol prn agitation  Chronic diastolic CHF -Clinically euvolemic -June 20, 2015 Echo EF 55-60%, grade 1 DD, PAS P 41  Lung nodule -LLL  nodule incidental finding on CT chest -Outpatient surveillance  Anemia of CKD -Baseline hemoglobin  ~10 -Check iron studies --iron saturation  11%, ferritin 153 -start ferrous sulfate  Hyperglycemia -Hemoglobin A1c--pending at time of d/c     Discharge Instructions  Discharge Instructions    Diet - low sodium heart healthy   Complete by:  As directed    Increase activity slowly   Complete by:  As directed      Allergies as of 03/26/2017      Reactions   Penicillins Anxiety   Has patient had a PCN reaction causing immediate rash, facial/tongue/throat swelling, SOB or lightheadedness with hypotension: Yes Has patient had a PCN reaction causing severe rash involving mucus membranes or skin necrosis: No Has patient had a PCN reaction that required hospitalization No Has patient had a PCN reaction occurring within the last 10 years: No If all of the above answers are "NO", then may proceed with Cephalosporin use.      Medication List    TAKE these medications   amantadine 100 MG capsule Commonly known as:  SYMMETREL Take 100 mg by mouth 3 (three) times daily.   amLODipine 5 MG tablet Commonly known as:  NORVASC Take 1 tablet (5 mg total) by mouth daily.   ferrous sulfate 325 (65 FE) MG tablet Take 1 tablet (325 mg total) by mouth daily.      Follow-up Information    Avon Gully, MD Follow up in 1 week(s).   Specialty:  Internal Medicine Contact information: [address] [place] [address] [phone]          Allergies  Allergen Reactions  . Penicillins Anxiety    Has patient had a PCN reaction causing immediate rash, facial/tongue/throat swelling, SOB or lightheadedness with hypotension: Yes Has patient had a PCN reaction causing severe rash involving mucus membranes or skin necrosis: No Has patient had a PCN reaction that required hospitalization No Has patient had a PCN reaction occurring within the last 10 years: No If all of the above answers are "NO", then may proceed with Cephalosporin use.       Consultations:  none   Procedures/Studies: Ct Abdomen Pelvis Wo Contrast  Result Date: 03/24/2017 CLINICAL DATA:  Blunt abdominal trauma.  Right leg pain. EXAM: CT CHEST, ABDOMEN AND PELVIS WITHOUT CONTRAST TECHNIQUE: Multidetector CT imaging of the chest, abdomen and pelvis was performed following the standard protocol without IV contrast. COMPARISON:  Chest x-ray 06/22/2015 FINDINGS: CT CHEST FINDINGS Cardiovascular: Normal heart size. No pericardial effusion. Atherosclerotic calcification seen along the LAD. Mediastinum/Nodes: Negative for hematoma. No acute vascular finding. Lungs/Pleura: No hemothorax, pneumothorax, or lung contusion. Centrilobular emphysema. Few areas of mild interstitial coarsening which may reflect scarring. No consolidating pneumonia. 4 mm pulmonary nodule in the left lower lobe, 3:95. Musculoskeletal: No acute finding CT ABDOMEN PELVIS FINDINGS Hepatobiliary: No evidence of injury. There is a subcentimeter low-density in the central liver that is too small for densitometry. Fat deposition along the lower falciform ligament. Numerous gallstones. No evidence of cholecystitis. Pancreas: Unremarkable Spleen: Unremarkable. Adrenals/Urinary Tract: Negative adrenals. No hydronephrosis or evidence of renal injury. Urinary bladder is moderately distended. Stomach/Bowel: The colon is distended by of stool or gas throughout most of its length. Uncomplicated distal duodenal diverticula. No evidence of bowel injury. Vascular/Lymphatic: Mild for age atherosclerotic calcification. No retroperitoneal hematoma. No incidental adenopathy. Reproductive: Negative Other: No ascites or pneumoperitoneum. Musculoskeletal: Spondylosis. A lucency through a left L3 inferior endplate spur has a chronic corticated appearance.  No acute fracture. Chronic L3 pars defects. IMPRESSION: 1. No evidence of acute injury to the chest, abdomen, or pelvis. 2. Centrilobular emphysema. 4 mm left lower lobe pulmonary  nodule. No follow-up needed if patient is low-risk. Non-contrast chest CT can be considered in 12 months if patient is high-risk. This recommendation follows the consensus statement: Guidelines for Management of Incidental Pulmonary Nodules Detected on CT Images: From the Fleischner Society 2017; Radiology 2017; 284:228-243. 3. Moderately distended urinary bladder. 4. Cholelithiasis. 5. Chronic L3 pars defects. Electronically Signed   By: Marnee Spring M.D.   On: 03/24/2017 17:48   Dg Ankle Complete Left  Result Date: 03/24/2017 CLINICAL DATA:  Fall.  Painful left ankle. EXAM: LEFT ANKLE COMPLETE - 3+ VIEW COMPARISON:  None. FINDINGS: No fracture.  No bone lesion. The ankle joint is normally spaced and aligned. No significant arthropathic change. There is lateral soft tissue swelling. IMPRESSION: No fracture or dislocation Electronically Signed   By: Amie Portland M.D.   On: 03/24/2017 16:44   Ct Head Wo Contrast  Result Date: 03/24/2017 CLINICAL DATA:  Niece reports pt fell down the steps a few hours ago. May be off of psychiatric medications. Unable to obtain clear story from pt. Niece states he was c/o his right leg hurting. Patient states he is hearing voices asking him what he did last night EXAM: CT HEAD WITHOUT CONTRAST CT CERVICAL SPINE WITHOUT CONTRAST TECHNIQUE: Multidetector CT imaging of the head and cervical spine was performed following the standard protocol without intravenous contrast. Multiplanar CT image reconstructions of the cervical spine were also generated. COMPARISON:  06/19/2015 FINDINGS: CT HEAD FINDINGS Brain: No evidence of acute infarction, hemorrhage, hydrocephalus, extra-axial collection or mass lesion/mass effect. Patchy areas of white matter hypoattenuation noted consistent with mild chronic microvascular ischemic change. Vascular: No hyperdense vessel or unexpected calcification. Skull: Normal. Negative for fracture or focal lesion. Sinuses/Orbits: Globes and orbits are  unremarkable. Sinuses and mastoid air cells are clear. Other: None. CT CERVICAL SPINE FINDINGS Alignment: Normal. Skull base and vertebrae: No acute fracture. No primary bone lesion or focal pathologic process. Soft tissues and spinal canal: No prevertebral fluid or swelling. No visible canal hematoma. No soft tissue masses or adenopathy. Disc levels: Minor disc bulging with endplate spurring at C3-C4, C4-C5 and C6-C7. No convincing disc herniation or significant stenosis. Mild facet degenerative change on the left at C4-C5. Upper chest: Unremarkable. Other: None. IMPRESSION: HEAD CT 1. No acute intracranial abnormalities. 2. Mild chronic microvascular change. CERVICAL CT 1. No fracture or acute finding. Electronically Signed   By: Amie Portland M.D.   On: 03/24/2017 17:39   Ct Chest Wo Contrast  Result Date: 03/24/2017 CLINICAL DATA:  Blunt abdominal trauma.  Right leg pain. EXAM: CT CHEST, ABDOMEN AND PELVIS WITHOUT CONTRAST TECHNIQUE: Multidetector CT imaging of the chest, abdomen and pelvis was performed following the standard protocol without IV contrast. COMPARISON:  Chest x-ray 06/22/2015 FINDINGS: CT CHEST FINDINGS Cardiovascular: Normal heart size. No pericardial effusion. Atherosclerotic calcification seen along the LAD. Mediastinum/Nodes: Negative for hematoma. No acute vascular finding. Lungs/Pleura: No hemothorax, pneumothorax, or lung contusion. Centrilobular emphysema. Few areas of mild interstitial coarsening which may reflect scarring. No consolidating pneumonia. 4 mm pulmonary nodule in the left lower lobe, 3:95. Musculoskeletal: No acute finding CT ABDOMEN PELVIS FINDINGS Hepatobiliary: No evidence of injury. There is a subcentimeter low-density in the central liver that is too small for densitometry. Fat deposition along the lower falciform ligament. Numerous gallstones. No evidence of cholecystitis. Pancreas:  Unremarkable Spleen: Unremarkable. Adrenals/Urinary Tract: Negative adrenals. No  hydronephrosis or evidence of renal injury. Urinary bladder is moderately distended. Stomach/Bowel: The colon is distended by of stool or gas throughout most of its length. Uncomplicated distal duodenal diverticula. No evidence of bowel injury. Vascular/Lymphatic: Mild for age atherosclerotic calcification. No retroperitoneal hematoma. No incidental adenopathy. Reproductive: Negative Other: No ascites or pneumoperitoneum. Musculoskeletal: Spondylosis. A lucency through a left L3 inferior endplate spur has a chronic corticated appearance. No acute fracture. Chronic L3 pars defects. IMPRESSION: 1. No evidence of acute injury to the chest, abdomen, or pelvis. 2. Centrilobular emphysema. 4 mm left lower lobe pulmonary nodule. No follow-up needed if patient is low-risk. Non-contrast chest CT can be considered in 12 months if patient is high-risk. This recommendation follows the consensus statement: Guidelines for Management of Incidental Pulmonary Nodules Detected on CT Images: From the Fleischner Society 2017; Radiology 2017; 284:228-243. 3. Moderately distended urinary bladder. 4. Cholelithiasis. 5. Chronic L3 pars defects. Electronically Signed   By: Marnee Spring M.D.   On: 03/24/2017 17:48   Ct Cervical Spine Wo Contrast  Result Date: 03/24/2017 CLINICAL DATA:  Niece reports pt fell down the steps a few hours ago. May be off of psychiatric medications. Unable to obtain clear story from pt. Niece states he was c/o his right leg hurting. Patient states he is hearing voices asking him what he did last night EXAM: CT HEAD WITHOUT CONTRAST CT CERVICAL SPINE WITHOUT CONTRAST TECHNIQUE: Multidetector CT imaging of the head and cervical spine was performed following the standard protocol without intravenous contrast. Multiplanar CT image reconstructions of the cervical spine were also generated. COMPARISON:  06/19/2015 FINDINGS: CT HEAD FINDINGS Brain: No evidence of acute infarction, hemorrhage, hydrocephalus,  extra-axial collection or mass lesion/mass effect. Patchy areas of white matter hypoattenuation noted consistent with mild chronic microvascular ischemic change. Vascular: No hyperdense vessel or unexpected calcification. Skull: Normal. Negative for fracture or focal lesion. Sinuses/Orbits: Globes and orbits are unremarkable. Sinuses and mastoid air cells are clear. Other: None. CT CERVICAL SPINE FINDINGS Alignment: Normal. Skull base and vertebrae: No acute fracture. No primary bone lesion or focal pathologic process. Soft tissues and spinal canal: No prevertebral fluid or swelling. No visible canal hematoma. No soft tissue masses or adenopathy. Disc levels: Minor disc bulging with endplate spurring at C3-C4, C4-C5 and C6-C7. No convincing disc herniation or significant stenosis. Mild facet degenerative change on the left at C4-C5. Upper chest: Unremarkable. Other: None. IMPRESSION: HEAD CT 1. No acute intracranial abnormalities. 2. Mild chronic microvascular change. CERVICAL CT 1. No fracture or acute finding. Electronically Signed   By: Amie Portland M.D.   On: 03/24/2017 17:39   US Renal  Result Date: 03/25/2017 CLINICAL DATA:  Patient with acute renal failure. EXAM: RENAL / URINARY TRACT ULTRASOUND COMPLETE COMPARISON:  CT abdomen pelvis 03/24/2017. FINDINGS: Right Kidney: Length: 10.0 cm. Echogenicity within normal limits. No mass or hydronephrosis visualized. Left Kidney: Length: 9.8 cm. Normal renal cortical thickness and echogenicity. No hydronephrosis. There is a 1.3 x 1.1 x 1.4 cm hypoechoic lesion superior pole left kidney, incompletely assessed on current exam due to location and poor visualization. Bladder: Appears normal for degree of bladder distention. IMPRESSION: No hydronephrosis. Electronically Signed   By: Annia Belt M.D.   On: 03/25/2017 08:07   Dg Foot 2 Views Left  Result Date: 03/24/2017 CLINICAL DATA:  Fall.  Left ankle/ foot pain. EXAM: LEFT FOOT - 2 VIEW COMPARISON:  None.  FINDINGS: There is a nondisplaced fracture  at the base of the distal phalanx of the great toe. Fractures seen across the medial base. Possible additional transverse fracture extending to the lateral base. A portion of the fracture involves the articular surface. No other evidence of a fracture. The joints are normally spaced and aligned. Small plantar calcaneal spur. Soft tissues are unremarkable. IMPRESSION: 1. Nondisplaced fracture at the base of the left great toe proximal phalanx. Electronically Signed   By: Amie Portlandavid  Ormond M.D.   On: 03/24/2017 16:46        Discharge Exam: Vitals:   03/25/17 1802 03/25/17 2100  BP: (!) 153/86 (!) 171/129  Pulse: (!) 101 88  Resp:  19  Temp:  97.6 F (36.4 C)  SpO2:  97%   Vitals:   03/24/17 2123 03/25/17 0300 03/25/17 1802 03/25/17 2100  BP: (!) 164/91 (!) 154/74 (!) 153/86 (!) 171/129  Pulse: 93 91 (!) 101 88  Resp: 20 19  19   Temp: 98.8 F (37.1 C) 98.4 F (36.9 C)  97.6 F (36.4 C)  TempSrc: Oral Oral  Oral  SpO2: 100% 99%  97%  Weight: 58.7 kg (129 lb 4.8 oz)     Height: 5\' 6"  (1.676 m)       General: Pt is alert, awake, not in acute distress Cardiovascular: RRR, S1/S2 +, no rubs, no gallops Respiratory: CTA bilaterally, no wheezing, no rhonchi Abdominal: Soft, NT, ND, bowel sounds + Extremities: no edema, no cyanosis   The results of significant diagnostics from this hospitalization (including imaging, microbiology, ancillary and laboratory) are listed below for reference.    Significant Diagnostic Studies: Ct Abdomen Pelvis Wo Contrast  Result Date: 03/24/2017 CLINICAL DATA:  Blunt abdominal trauma.  Right leg pain. EXAM: CT CHEST, ABDOMEN AND PELVIS WITHOUT CONTRAST TECHNIQUE: Multidetector CT imaging of the chest, abdomen and pelvis was performed following the standard protocol without IV contrast. COMPARISON:  Chest x-ray 06/22/2015 FINDINGS: CT CHEST FINDINGS Cardiovascular: Normal heart size. No pericardial effusion.  Atherosclerotic calcification seen along the LAD. Mediastinum/Nodes: Negative for hematoma. No acute vascular finding. Lungs/Pleura: No hemothorax, pneumothorax, or lung contusion. Centrilobular emphysema. Few areas of mild interstitial coarsening which may reflect scarring. No consolidating pneumonia. 4 mm pulmonary nodule in the left lower lobe, 3:95. Musculoskeletal: No acute finding CT ABDOMEN PELVIS FINDINGS Hepatobiliary: No evidence of injury. There is a subcentimeter low-density in the central liver that is too small for densitometry. Fat deposition along the lower falciform ligament. Numerous gallstones. No evidence of cholecystitis. Pancreas: Unremarkable Spleen: Unremarkable. Adrenals/Urinary Tract: Negative adrenals. No hydronephrosis or evidence of renal injury. Urinary bladder is moderately distended. Stomach/Bowel: The colon is distended by of stool or gas throughout most of its length. Uncomplicated distal duodenal diverticula. No evidence of bowel injury. Vascular/Lymphatic: Mild for age atherosclerotic calcification. No retroperitoneal hematoma. No incidental adenopathy. Reproductive: Negative Other: No ascites or pneumoperitoneum. Musculoskeletal: Spondylosis. A lucency through a left L3 inferior endplate spur has a chronic corticated appearance. No acute fracture. Chronic L3 pars defects. IMPRESSION: 1. No evidence of acute injury to the chest, abdomen, or pelvis. 2. Centrilobular emphysema. 4 mm left lower lobe pulmonary nodule. No follow-up needed if patient is low-risk. Non-contrast chest CT can be considered in 12 months if patient is high-risk. This recommendation follows the consensus statement: Guidelines for Management of Incidental Pulmonary Nodules Detected on CT Images: From the Fleischner Society 2017; Radiology 2017; 284:228-243. 3. Moderately distended urinary bladder. 4. Cholelithiasis. 5. Chronic L3 pars defects. Electronically Signed   By: Kathrynn DuckingJonathon  Watts M.D.  On: 03/24/2017  17:48   Dg Ankle Complete Left  Result Date: 03/24/2017 CLINICAL DATA:  Fall.  Painful left ankle. EXAM: LEFT ANKLE COMPLETE - 3+ VIEW COMPARISON:  None. FINDINGS: No fracture.  No bone lesion. The ankle joint is normally spaced and aligned. No significant arthropathic change. There is lateral soft tissue swelling. IMPRESSION: No fracture or dislocation Electronically Signed   By: Amie Portlandavid  Ormond M.D.   On: 03/24/2017 16:44   Ct Head Wo Contrast  Result Date: 03/24/2017 CLINICAL DATA:  Niece reports pt fell down the steps a few hours ago. May be off of psychiatric medications. Unable to obtain clear story from pt. Niece states he was c/o his right leg hurting. Patient states he is hearing voices asking him what he did last night EXAM: CT HEAD WITHOUT CONTRAST CT CERVICAL SPINE WITHOUT CONTRAST TECHNIQUE: Multidetector CT imaging of the head and cervical spine was performed following the standard protocol without intravenous contrast. Multiplanar CT image reconstructions of the cervical spine were also generated. COMPARISON:  06/19/2015 FINDINGS: CT HEAD FINDINGS Brain: No evidence of acute infarction, hemorrhage, hydrocephalus, extra-axial collection or mass lesion/mass effect. Patchy areas of white matter hypoattenuation noted consistent with mild chronic microvascular ischemic change. Vascular: No hyperdense vessel or unexpected calcification. Skull: Normal. Negative for fracture or focal lesion. Sinuses/Orbits: Globes and orbits are unremarkable. Sinuses and mastoid air cells are clear. Other: None. CT CERVICAL SPINE FINDINGS Alignment: Normal. Skull base and vertebrae: No acute fracture. No primary bone lesion or focal pathologic process. Soft tissues and spinal canal: No prevertebral fluid or swelling. No visible canal hematoma. No soft tissue masses or adenopathy. Disc levels: Minor disc bulging with endplate spurring at C3-C4, C4-C5 and C6-C7. No convincing disc herniation or significant stenosis.  Mild facet degenerative change on the left at C4-C5. Upper chest: Unremarkable. Other: None. IMPRESSION: HEAD CT 1. No acute intracranial abnormalities. 2. Mild chronic microvascular change. CERVICAL CT 1. No fracture or acute finding. Electronically Signed   By: Amie Portlandavid  Ormond M.D.   On: 03/24/2017 17:39   Ct Chest Wo Contrast  Result Date: 03/24/2017 CLINICAL DATA:  Blunt abdominal trauma.  Right leg pain. EXAM: CT CHEST, ABDOMEN AND PELVIS WITHOUT CONTRAST TECHNIQUE: Multidetector CT imaging of the chest, abdomen and pelvis was performed following the standard protocol without IV contrast. COMPARISON:  Chest x-ray 06/22/2015 FINDINGS: CT CHEST FINDINGS Cardiovascular: Normal heart size. No pericardial effusion. Atherosclerotic calcification seen along the LAD. Mediastinum/Nodes: Negative for hematoma. No acute vascular finding. Lungs/Pleura: No hemothorax, pneumothorax, or lung contusion. Centrilobular emphysema. Few areas of mild interstitial coarsening which may reflect scarring. No consolidating pneumonia. 4 mm pulmonary nodule in the left lower lobe, 3:95. Musculoskeletal: No acute finding CT ABDOMEN PELVIS FINDINGS Hepatobiliary: No evidence of injury. There is a subcentimeter low-density in the central liver that is too small for densitometry. Fat deposition along the lower falciform ligament. Numerous gallstones. No evidence of cholecystitis. Pancreas: Unremarkable Spleen: Unremarkable. Adrenals/Urinary Tract: Negative adrenals. No hydronephrosis or evidence of renal injury. Urinary bladder is moderately distended. Stomach/Bowel: The colon is distended by of stool or gas throughout most of its length. Uncomplicated distal duodenal diverticula. No evidence of bowel injury. Vascular/Lymphatic: Mild for age atherosclerotic calcification. No retroperitoneal hematoma. No incidental adenopathy. Reproductive: Negative Other: No ascites or pneumoperitoneum. Musculoskeletal: Spondylosis. A lucency through a  left L3 inferior endplate spur has a chronic corticated appearance. No acute fracture. Chronic L3 pars defects. IMPRESSION: 1. No evidence of acute injury to the chest, abdomen, or  pelvis. 2. Centrilobular emphysema. 4 mm left lower lobe pulmonary nodule. No follow-up needed if patient is low-risk. Non-contrast chest CT can be considered in 12 months if patient is high-risk. This recommendation follows the consensus statement: Guidelines for Management of Incidental Pulmonary Nodules Detected on CT Images: From the Fleischner Society 2017; Radiology 2017; 284:228-243. 3. Moderately distended urinary bladder. 4. Cholelithiasis. 5. Chronic L3 pars defects. Electronically Signed   By: Marnee Spring M.D.   On: 03/24/2017 17:48   Ct Cervical Spine Wo Contrast  Result Date: 03/24/2017 CLINICAL DATA:  Niece reports pt fell down the steps a few hours ago. May be off of psychiatric medications. Unable to obtain clear story from pt. Niece states he was c/o his right leg hurting. Patient states he is hearing voices asking him what he did last night EXAM: CT HEAD WITHOUT CONTRAST CT CERVICAL SPINE WITHOUT CONTRAST TECHNIQUE: Multidetector CT imaging of the head and cervical spine was performed following the standard protocol without intravenous contrast. Multiplanar CT image reconstructions of the cervical spine were also generated. COMPARISON:  06/19/2015 FINDINGS: CT HEAD FINDINGS Brain: No evidence of acute infarction, hemorrhage, hydrocephalus, extra-axial collection or mass lesion/mass effect. Patchy areas of white matter hypoattenuation noted consistent with mild chronic microvascular ischemic change. Vascular: No hyperdense vessel or unexpected calcification. Skull: Normal. Negative for fracture or focal lesion. Sinuses/Orbits: Globes and orbits are unremarkable. Sinuses and mastoid air cells are clear. Other: None. CT CERVICAL SPINE FINDINGS Alignment: Normal. Skull base and vertebrae: No acute fracture. No  primary bone lesion or focal pathologic process. Soft tissues and spinal canal: No prevertebral fluid or swelling. No visible canal hematoma. No soft tissue masses or adenopathy. Disc levels: Minor disc bulging with endplate spurring at C3-C4, C4-C5 and C6-C7. No convincing disc herniation or significant stenosis. Mild facet degenerative change on the left at C4-C5. Upper chest: Unremarkable. Other: None. IMPRESSION: HEAD CT 1. No acute intracranial abnormalities. 2. Mild chronic microvascular change. CERVICAL CT 1. No fracture or acute finding. Electronically Signed   By: Amie Portland M.D.   On: 03/24/2017 17:39   US Renal  Result Date: 03/25/2017 CLINICAL DATA:  Patient with acute renal failure. EXAM: RENAL / URINARY TRACT ULTRASOUND COMPLETE COMPARISON:  CT abdomen pelvis 03/24/2017. FINDINGS: Right Kidney: Length: 10.0 cm. Echogenicity within normal limits. No mass or hydronephrosis visualized. Left Kidney: Length: 9.8 cm. Normal renal cortical thickness and echogenicity. No hydronephrosis. There is a 1.3 x 1.1 x 1.4 cm hypoechoic lesion superior pole left kidney, incompletely assessed on current exam due to location and poor visualization. Bladder: Appears normal for degree of bladder distention. IMPRESSION: No hydronephrosis. Electronically Signed   By: Annia Belt M.D.   On: 03/25/2017 08:07   Dg Foot 2 Views Left  Result Date: 03/24/2017 CLINICAL DATA:  Fall.  Left ankle/ foot pain. EXAM: LEFT FOOT - 2 VIEW COMPARISON:  None. FINDINGS: There is a nondisplaced fracture at the base of the distal phalanx of the great toe. Fractures seen across the medial base. Possible additional transverse fracture extending to the lateral base. A portion of the fracture involves the articular surface. No other evidence of a fracture. The joints are normally spaced and aligned. Small plantar calcaneal spur. Soft tissues are unremarkable. IMPRESSION: 1. Nondisplaced fracture at the base of the left great toe proximal  phalanx. Electronically Signed   By: Amie Portland M.D.   On: 03/24/2017 16:46     Microbiology: Recent Results (from the past 240 hour(s))  Blood  culture (routine x 2)     Status: None (Preliminary result)   Collection Time: 03/24/17  2:51 PM  Result Value Ref Range Status   Specimen Description BLOOD RIGHT FOREARM  Final   Special Requests   Final    BOTTLES DRAWN AEROBIC AND ANAEROBIC Blood Culture adequate volume   Culture NO GROWTH 2 DAYS  Final   Report Status PENDING  Incomplete  Blood culture (routine x 2)     Status: None (Preliminary result)   Collection Time: 03/24/17  2:51 PM  Result Value Ref Range Status   Specimen Description BLOOD LEFT HAND  Final   Special Requests   Final    BOTTLES DRAWN AEROBIC AND ANAEROBIC Blood Culture adequate volume   Culture NO GROWTH 2 DAYS  Final   Report Status PENDING  Incomplete     Labs: Basic Metabolic Panel: Recent Labs  Lab 03/24/17 1451 03/25/17 0629 03/26/17 0937  NA 137 136 138  K 5.6* 3.9 4.0  CL 106 105 109  CO2 19* 17* 22  GLUCOSE 116* 70 94  BUN 51* 46* 24*  CREATININE 4.15* 2.87* 1.77*  CALCIUM 9.6 8.7* 8.9   Liver Function Tests: Recent Labs  Lab 03/24/17 1451  AST 44*  ALT 11*  ALKPHOS 70  BILITOT 1.0  PROT 7.2  ALBUMIN 4.4   No results for input(s): LIPASE, AMYLASE in the last 168 hours. No results for input(s): AMMONIA in the last 168 hours. CBC: Recent Labs  Lab 03/24/17 1451 03/25/17 0629 03/26/17 0937  WBC 9.9 8.6 4.8  NEUTROABS 8.7*  --   --   HGB 11.3* 11.0* 10.2*  HCT 36.1* 35.4* 32.5*  MCV 95.0 96.7 96.2  PLT 311 265 248   Cardiac Enzymes: Recent Labs  Lab 03/24/17 1451  TROPONINI 0.03*   BNP: Invalid input(s): POCBNP CBG: No results for input(s): GLUCAP in the last 168 hours.  Time coordinating discharge:  Greater than 30 minutes  Signed:  Keny Donald, DO [facility] Pager: [phone] 03/26/2017, 11:48 AM

## 2017-03-26 NOTE — Progress Notes (Signed)
Pt being monitored by [employer] sitter

## 2017-03-26 NOTE — Progress Notes (Signed)
Pt has been fighting with everyone that comes in room and tries to clean him or adjust him in bed. Continues to attempt to climb out of bed and keeps legs dangling out of bed. Pt has been given ativan and is still agitated. Spoke with MD and directed to give haldol. Call light within reach. Bed in lowest position. Will continue to monitor.

## 2017-03-26 NOTE — Progress Notes (Signed)
Avasyst says patient is not appropriate for avasyst, and they took him off.  The patient tries to get out of bed every few minutes and I and my nurse tech are constantly running to his room when his bed alarm goes off. No sitter available to sit with him at this time.

## 2017-03-27 LAB — HEMOGLOBIN A1C
Hgb A1c MFr Bld: 4.9 % (ref 4.8–5.6)
Mean Plasma Glucose: 94 mg/dL

## 2017-03-29 LAB — CULTURE, BLOOD (ROUTINE X 2)
CULTURE: NO GROWTH
Culture: NO GROWTH
Special Requests: ADEQUATE
Special Requests: ADEQUATE

## 2017-05-22 IMAGING — CT CT HEAD W/O CM
1 series · 16 of 30 positions shown, 20 images · non-contrast
Comparison: None.

CLINICAL DATA: Fall at home with syncope.  Confusion.

EXAM:
CT HEAD WITHOUT CONTRAST
TECHNIQUE: Contiguous axial images were obtained from the base of the skull
through the vertex without intravenous contrast.

[Series 2: headtrauma 4.8 h37s · axial · 0.43mm/px · z∈[+114,+271]mm · 16 of 36 slices shown, 20 images]
[im 2/36  brain]
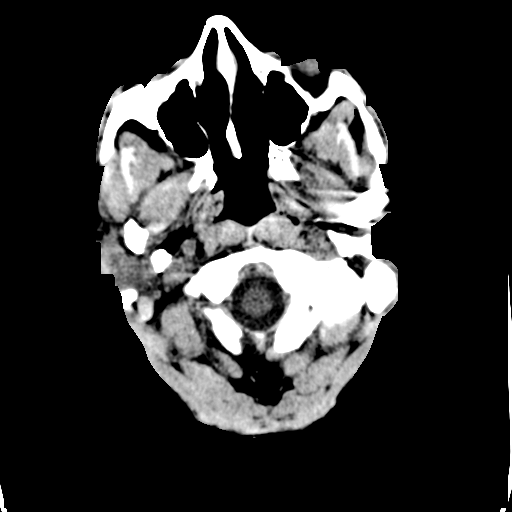
[im 2/36  bone]
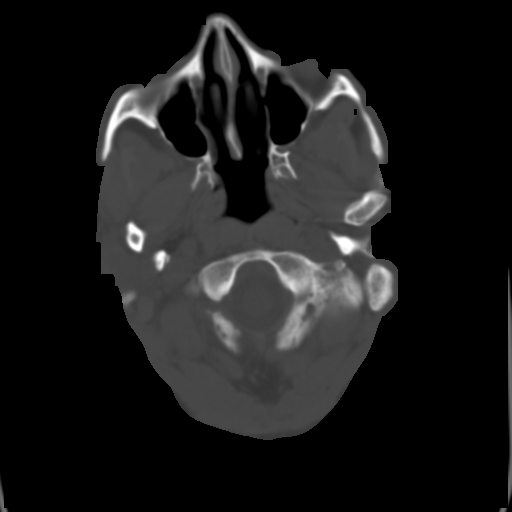
[im 4/36  brain]
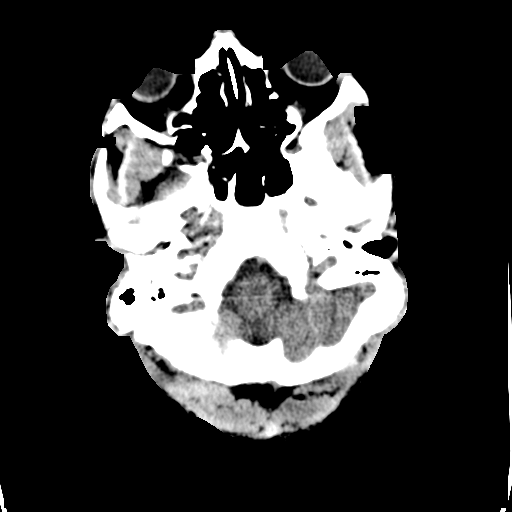
[im 7/36  brain]
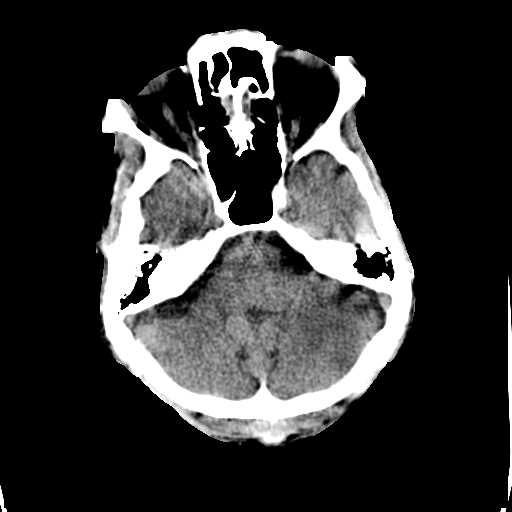
[im 9/36  brain]
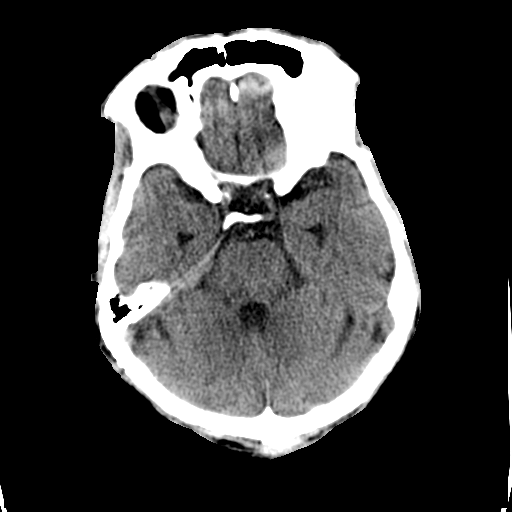
[im 10/36  brain]
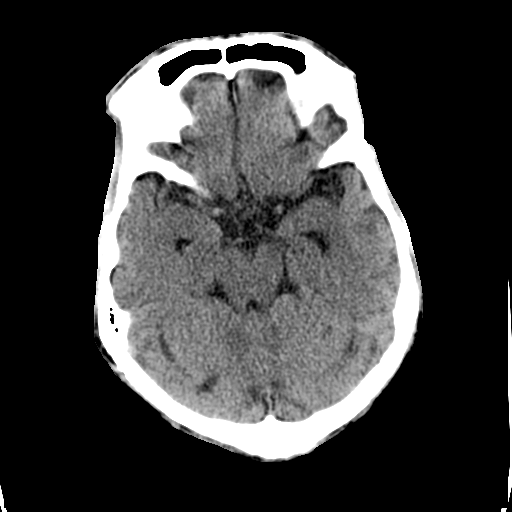
[im 10/36  bone]
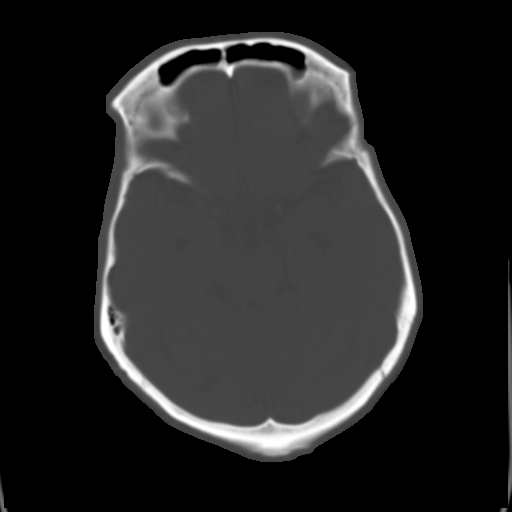
[im 13/36  brain]
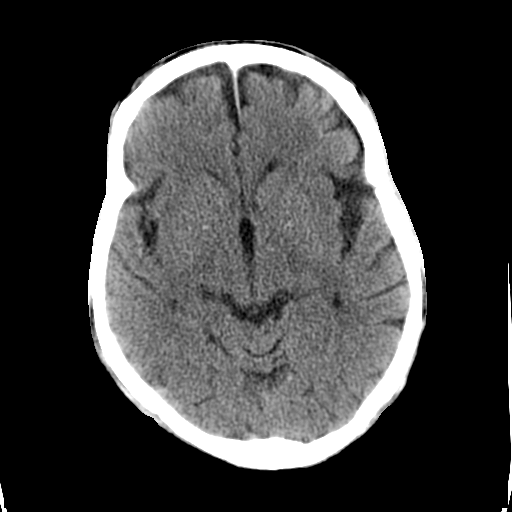
[im 15/36  brain]
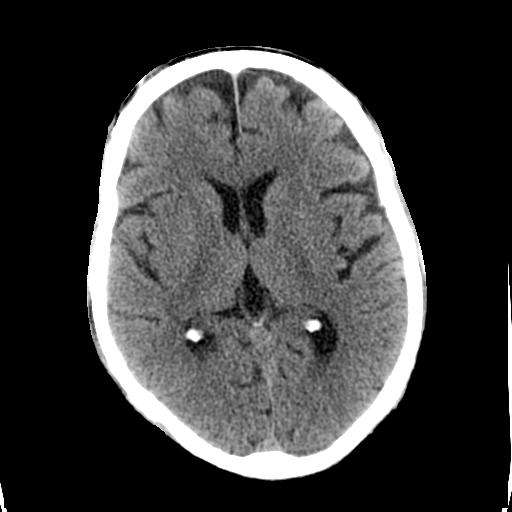
[im 17/36  brain]
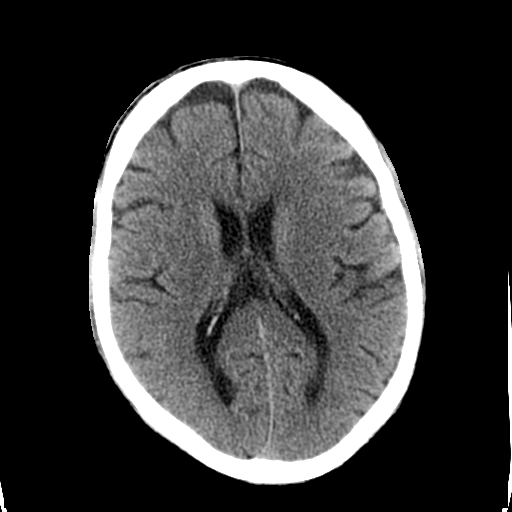
[im 19/36  brain]
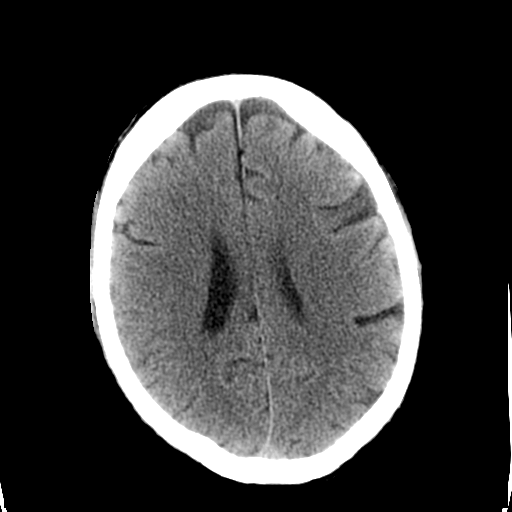
[im 19/36  bone]
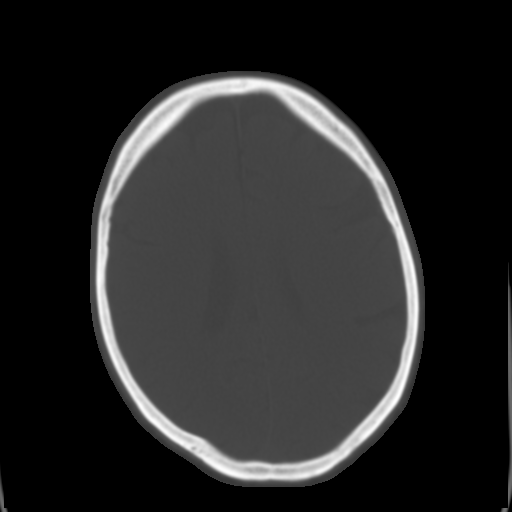
[im 21/36  brain]
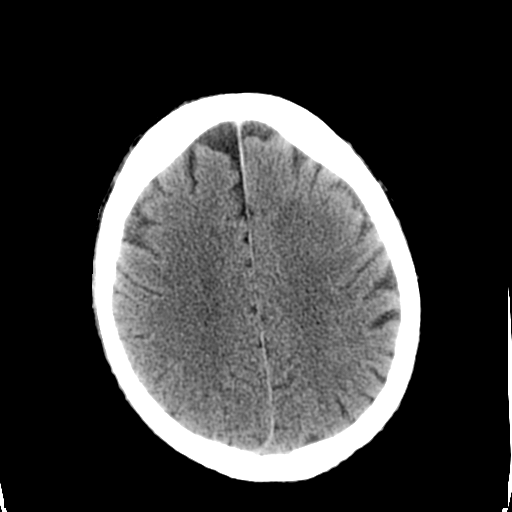
[im 23/36  brain]
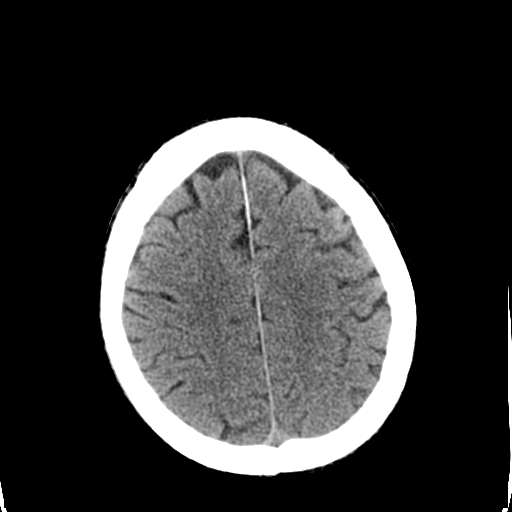
[im 26/36  brain]
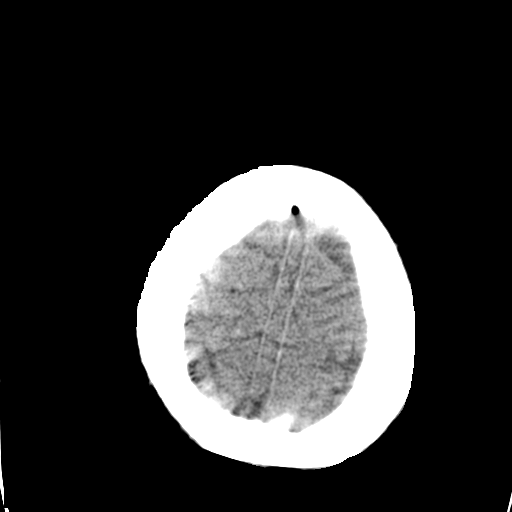
[im 27/36  brain]
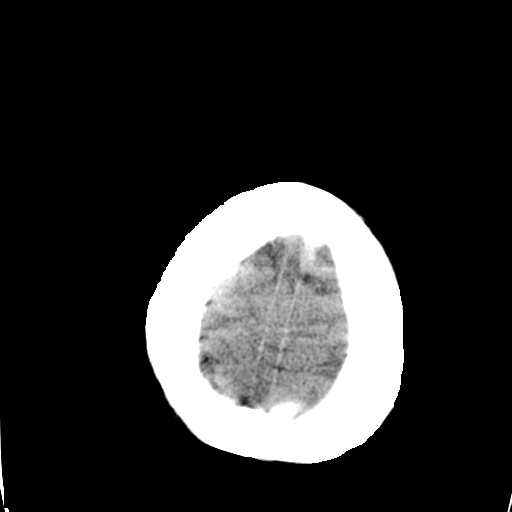
[im 27/36  bone]
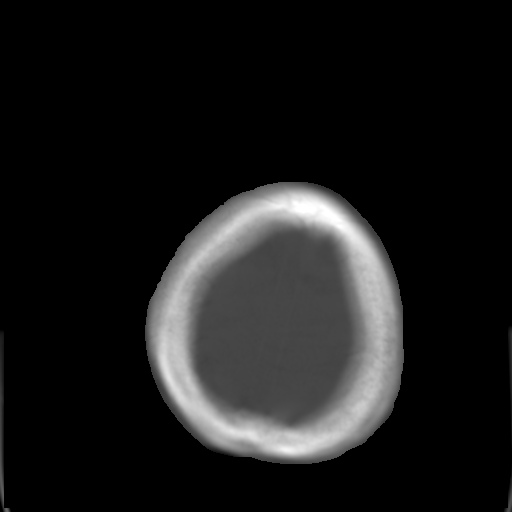
[im 29/36  brain]
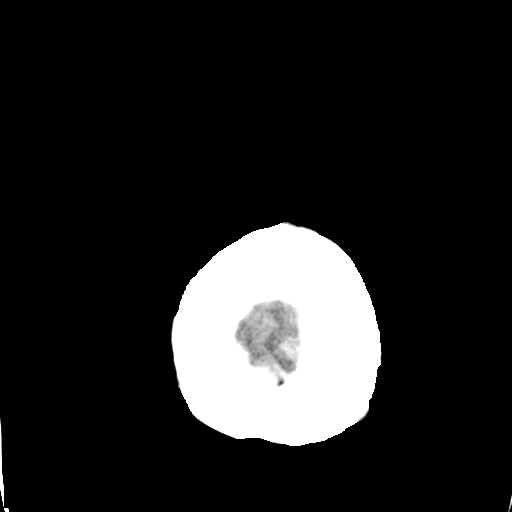
[im 32/36  brain]
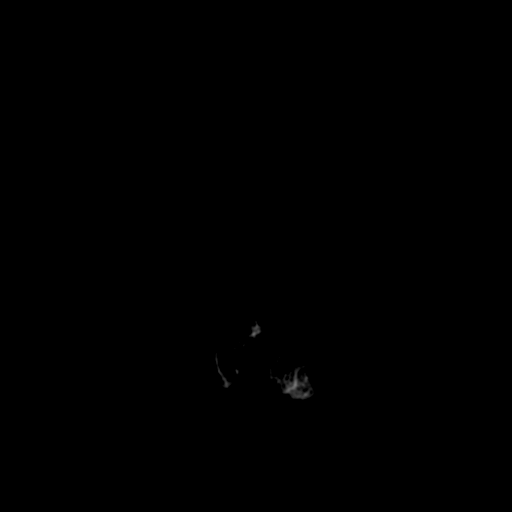
[im 34/36  brain]
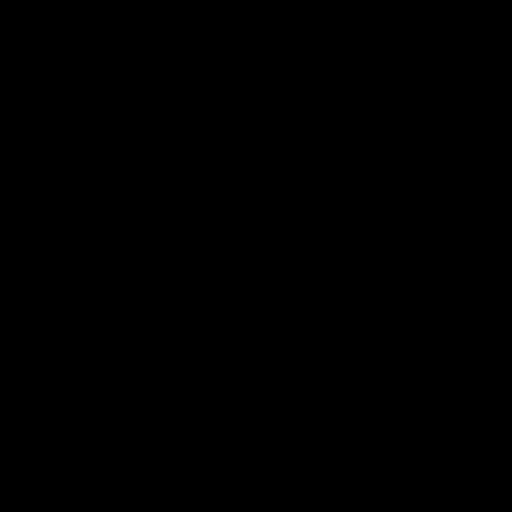

[16 of 30 positions shown; findings below may reference images not displayed]

FINDINGS: Motion at the vertex obscuring a small portion of the brain.

Skull and Sinuses:Negative for fracture or destructive process. The
visualized mastoids, middle ears, and imaged paranasal sinuses are
clear.

Visualized orbits: Negative.

Brain: No evidence of acute infarction, hemorrhage, hydrocephalus,
or mass lesion/mass effect. Remote small vessel infarct in the upper
right cerebellum. Normal cerebral volume and white matter
appearance.
IMPRESSION: 1. No acute finding.
2. Small, remote right cerebellar infarct.
3. Motion degraded at the vertex. These slices could be repeated
depending on clinical concern.

## 2017-05-23 IMAGING — CR DG CHEST 1V PORT
1 series · 1 of 1 positions shown · non-contrast
Comparison: 06/16/1968 chest radiograph.

CLINICAL DATA: Hypoxia.  Shortness of breath.

EXAM:
PORTABLE CHEST 1 VIEW

[ap portable]
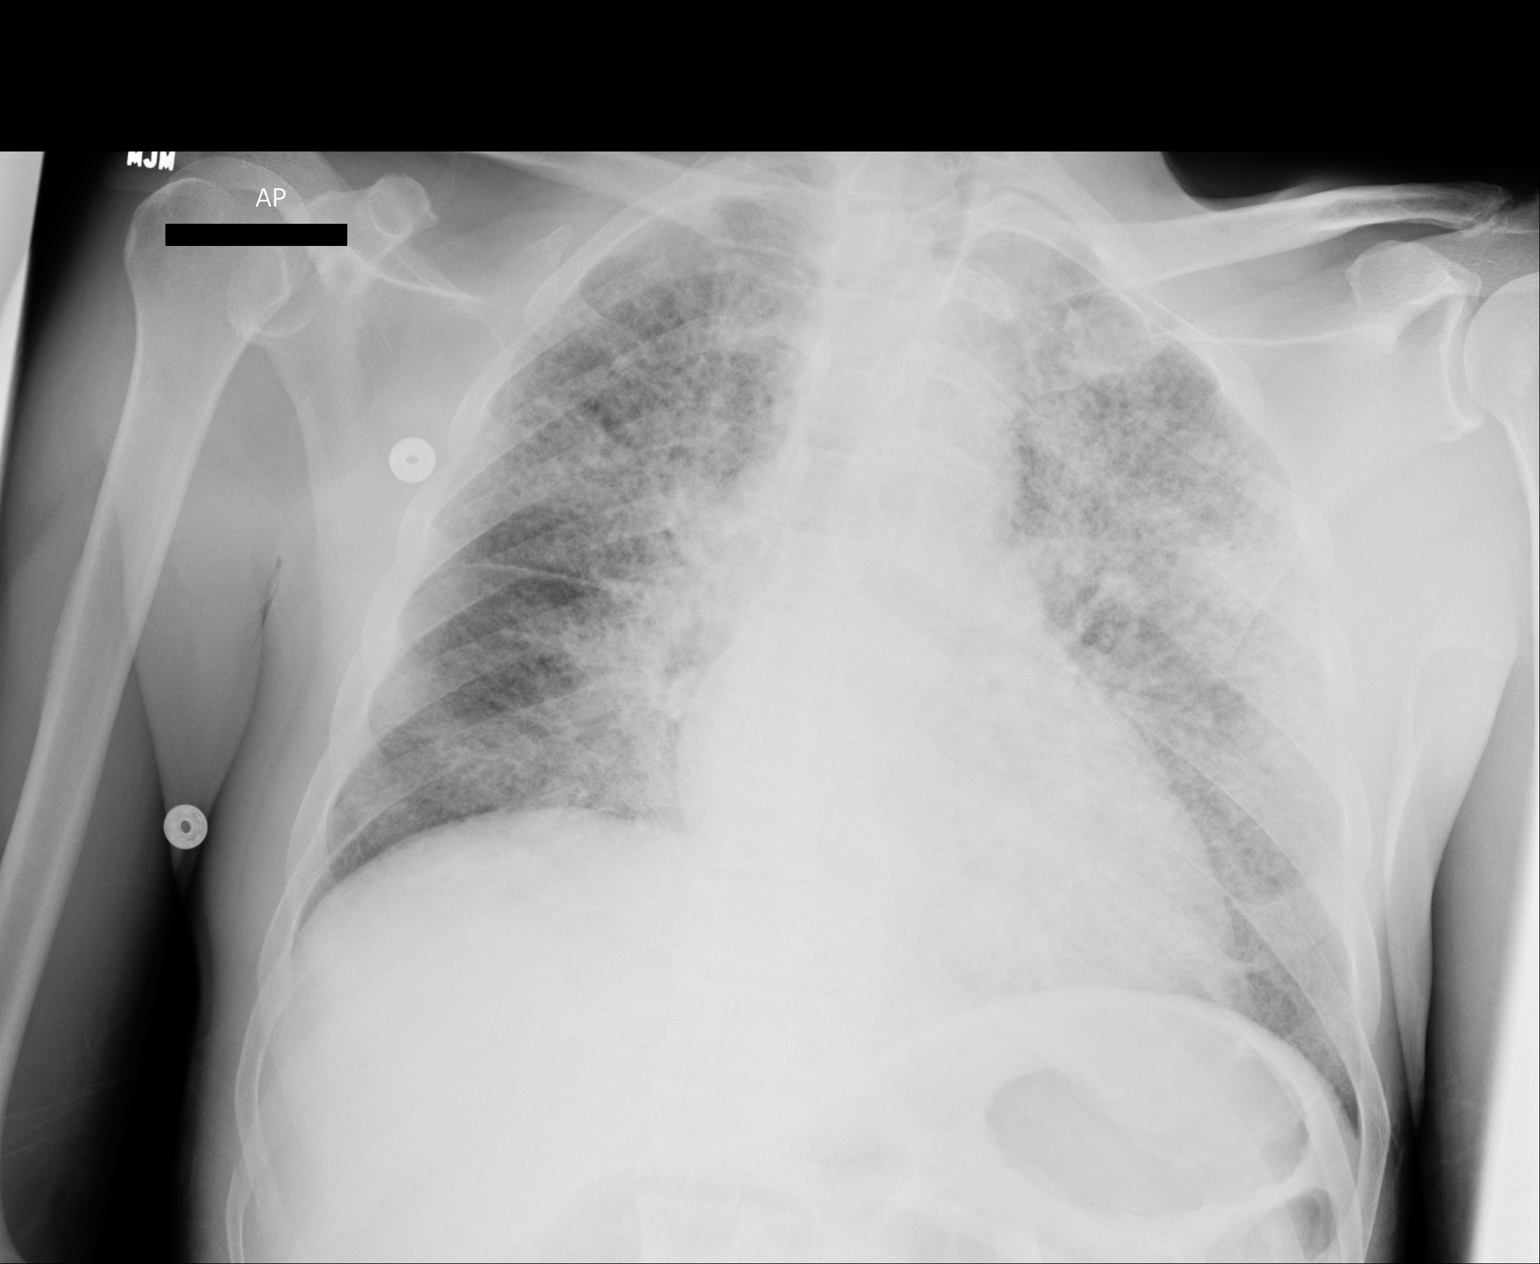

[1 of 1 positions shown; findings below may reference images not displayed]

FINDINGS: Stable cardiomediastinal silhouette with normal heart size. No
pneumothorax. No pleural effusion. There are severe linear and
fluffy airspace opacities throughout the parahilar lungs
bilaterally, significantly worsened since 06/17/2015. Stable
nondisplaced lateral right tenth rib fracture.
IMPRESSION: 1. Significant worsening of severe linear and fluffy airspace
opacities throughout the bilateral parahilar lungs, favor atypical
pneumonia or noncardiogenic pulmonary edema/ARDS.
2. Stable nondisplaced lateral right tenth rib fracture.

## 2021-12-31 ENCOUNTER — Ambulatory Visit (INDEPENDENT_AMBULATORY_CARE_PROVIDER_SITE_OTHER): Payer: Medicare Other | Admitting: Urology

## 2021-12-31 ENCOUNTER — Encounter: Payer: Self-pay | Admitting: Urology

## 2021-12-31 VITALS — BP 154/82 | HR 68

## 2021-12-31 DIAGNOSIS — R972 Elevated prostate specific antigen [PSA]: Secondary | ICD-10-CM

## 2021-12-31 LAB — URINALYSIS, ROUTINE W REFLEX MICROSCOPIC
Bilirubin, UA: NEGATIVE
Glucose, UA: NEGATIVE
Ketones, UA: NEGATIVE
Leukocytes,UA: NEGATIVE
Nitrite, UA: NEGATIVE
RBC, UA: NEGATIVE
Specific Gravity, UA: >1.03 — ABNORMAL HIGH (ref 1.005–1.030)
Urobilinogen, Ur: 0.2 mg/dL (ref 0.2–1.0)
pH, UA: 5 (ref 5.0–7.5)

## 2021-12-31 NOTE — Progress Notes (Signed)
Assessment: 1. Elevated PSA     Plan: Patient records from Dr. Olena Leatherwood reviewed today Today I had a long discussion with the patient regarding PSA and the rationale and controversies of prostate cancer early detection.  I discussed the pros and cons of further evaluation including TRUS and prostate Bx.  Potential adverse events and complications as well as standard instructions were given.  Patient expressed his understanding of these issues. His PSA is within normal range for his age. Recommend repeating free and total PSA in 3 months Return to office in 3 months  Chief Complaint:  Chief Complaint  Patient presents with   Elevated PSA    History of Present Illness:  Jared Austin is a 76 y.o. male who is seen in consultation from Toma Deiters, MD for evaluation of elevated PSA. Recent PSA from 12/17/2021 was 4.8.  No other PSA results available for review. He is not aware of previously elevated PSAs.  No family history of prostate cancer.  No history of prostatitis or UTIs.  He does not have any lower urinary tract symptoms.  No dysuria or gross hematuria. IPSS = 0 today.   Past Medical History:  Past Medical History:  Diagnosis Date   Anxiety    Hypertension    Pneumonia    Schizophrenia (HCC)     Past Surgical History:  Past Surgical History:  Procedure Laterality Date   CARDIAC CATHETERIZATION N/A 02/10/2015   Procedure: Left Heart Cath and Coronary Angiography;  Surgeon: Orpah Cobb, MD;  Location: [facility];  Service: Cardiovascular;  Laterality: N/A;    Allergies:  Allergies  Allergen Reactions   Penicillins Anxiety    Has patient had a PCN reaction causing immediate rash, facial/tongue/throat swelling, SOB or lightheadedness with hypotension: Yes Has patient had a PCN reaction causing severe rash involving mucus membranes or skin necrosis: No Has patient had a PCN reaction that required hospitalization No Has patient had a PCN reaction  occurring within the last 10 years: No If all of the above answers are "NO", then may proceed with Cephalosporin use.      Family History:  No family history on file.  Social History:  Social History   Tobacco Use   Smoking status: Every Day    Packs/day: 1.50    Years: 62.00    Total pack years: 93.00    Types: Cigarettes   Smokeless tobacco: Never  Substance Use Topics   Alcohol use: No   Drug use: No    Review of symptoms:  Constitutional:  Negative for unexplained weight loss, night sweats, fever, chills ENT:  Negative for nose bleeds, sinus pain, painful swallowing CV:  Negative for chest pain, shortness of breath, exercise intolerance, palpitations, loss of consciousness Resp:  Negative for cough, wheezing, shortness of breath GI:  Negative for nausea, vomiting, diarrhea, bloody stools GU:  Positives noted in HPI; otherwise negative for gross hematuria, dysuria, urinary incontinence Neuro:  Negative for seizures, poor balance, limb weakness, slurred speech Psych:  Negative for lack of energy, depression, anxiety Endocrine:  Negative for polydipsia, polyuria, symptoms of hypoglycemia (dizziness, hunger, sweating) Hematologic:  Negative for anemia, purpura, petechia, prolonged or excessive bleeding, use of anticoagulants  Allergic:  Negative for difficulty breathing or choking as a result of exposure to anything; no shellfish allergy; no allergic response (rash/itch) to materials, foods  Physical exam: BP (!) 154/82   Pulse 68  GENERAL APPEARANCE:  Well appearing, well developed, well nourished, NAD HEENT: Atraumatic,  Normocephalic, oropharynx clear. NECK: Supple without lymphadenopathy or thyromegaly. LUNGS: Clear to auscultation bilaterally. HEART: Regular Rate and Rhythm without murmurs, gallops, or rubs. ABDOMEN: Soft, non-tender, No Masses. EXTREMITIES: Moves all extremities well.  Without clubbing, cyanosis, or edema. NEUROLOGIC:  Alert and oriented x 3,  normal gait, CN II-XII grossly intact.  MENTAL STATUS:  Appropriate. BACK:  Non-tender to palpation.  No CVAT SKIN:  Warm, dry and intact.   GU: Penis:  uncircumcised Meatus: Normal Scrotum: normal, no masses Testis: normal without masses bilateral Epididymis: normal Prostate: 40 g, NT, no nodules Rectum: Normal tone,  no masses or tenderness   Results: U/A:  dipstick negative

## 2022-03-31 ENCOUNTER — Ambulatory Visit (INDEPENDENT_AMBULATORY_CARE_PROVIDER_SITE_OTHER): Payer: Medicare Other | Admitting: Urology

## 2022-03-31 ENCOUNTER — Encounter: Payer: Self-pay | Admitting: Urology

## 2022-03-31 VITALS — BP 125/81 | HR 74 | Ht 66.0 in | Wt 129.0 lb

## 2022-03-31 DIAGNOSIS — R972 Elevated prostate specific antigen [PSA]: Secondary | ICD-10-CM | POA: Diagnosis not present

## 2022-03-31 LAB — POCT URINALYSIS DIPSTICK
Bilirubin, UA: NEGATIVE
Blood, UA: NEGATIVE
Glucose, UA: NEGATIVE
Ketones, UA: NEGATIVE
Leukocytes, UA: NEGATIVE
Nitrite, UA: NEGATIVE
Protein, UA: POSITIVE — AB
Spec Grav, UA: 1.025 (ref 1.010–1.025)
Urobilinogen, UA: 0.2 E.U./dL
pH, UA: 5 (ref 5.0–8.0)

## 2022-03-31 NOTE — Progress Notes (Signed)
Assessment: 1. Elevated PSA     Plan: Free and total PSA today - will call with results Return to office in 6 months  Chief Complaint:  Chief Complaint  Patient presents with   Elevated PSA    History of Present Illness:  Jared Austin is a 76 y.o. male who is seen for continued evaluation of elevated PSA. Recent PSA from 12/17/2021 was 4.8.  No other PSA results available for review. He is not aware of previously elevated PSAs.  No family history of prostate cancer.  No history of prostatitis or UTIs.  He does not have any lower urinary tract symptoms.  No dysuria or gross hematuria. IPSS = 0. DRE from 8/23:  40 g, NT, no nodules  He returns today for follow-up.  No new urinary symptoms other than occasional slowing of his stream.  No dysuria or gross hematuria. IPSS = 1 today.  Portions of the above documentation were copied from a prior visit for review purposes only.   Past Medical History:  Past Medical History:  Diagnosis Date   Anxiety    Hypertension    Pneumonia    Schizophrenia (HCC)     Past Surgical History:  Past Surgical History:  Procedure Laterality Date   CARDIAC CATHETERIZATION N/A 02/10/2015   Procedure: Left Heart Cath and Coronary Angiography;  Surgeon: Orpah Cobb, MD;  Location: [facility];  Service: Cardiovascular;  Laterality: N/A;    Allergies:  Allergies  Allergen Reactions   Penicillins Anxiety    Has patient had a PCN reaction causing immediate rash, facial/tongue/throat swelling, SOB or lightheadedness with hypotension: Yes Has patient had a PCN reaction causing severe rash involving mucus membranes or skin necrosis: No Has patient had a PCN reaction that required hospitalization No Has patient had a PCN reaction occurring within the last 10 years: No If all of the above answers are "NO", then may proceed with Cephalosporin use.      Family History:  No family history on file.  Social History:  Social History    Tobacco Use   Smoking status: Every Day    Packs/day: 1.50    Years: 62.00    Total pack years: 93.00    Types: Cigarettes   Smokeless tobacco: Never  Substance Use Topics   Alcohol use: No   Drug use: No    ROS: Constitutional:  Negative for fever, chills, weight loss CV: Negative for chest pain, previous MI, hypertension Respiratory:  Negative for shortness of breath, wheezing, sleep apnea, frequent cough GI:  Negative for nausea, vomiting, bloody stool, GERD  Physical exam: BP 125/81   Pulse 74   Ht 5\' 6"  (1.676 m)   Wt 129 lb (58.5 kg)   BMI 20.82 kg/m  GENERAL APPEARANCE:  Well appearing, well developed, well nourished, NAD HEENT:  Atraumatic, normocephalic, oropharynx clear NECK:  Supple without lymphadenopathy or thyromegaly ABDOMEN:  Soft, non-tender, no masses EXTREMITIES:  Moves all extremities well, without clubbing, cyanosis, or edema NEUROLOGIC:  Alert and oriented x 3, normal gait, CN II-XII grossly intact MENTAL STATUS:  appropriate BACK:  Non-tender to palpation, No CVAT SKIN:  Warm, dry, and intact   Results: Results for orders placed or performed in visit on 03/31/22 (from the past 24 hour(s))  POCT urinalysis dipstick     Status: Abnormal   Collection Time: 03/31/22 10:13 AM  Result Value Ref Range   Color, UA     Clarity, UA     Glucose, UA  Negative Negative   Bilirubin, UA negative    Ketones, UA negative    Spec Grav, UA 1.025 1.010 - 1.025   Blood, UA negative    pH, UA 5.0 5.0 - 8.0   Protein, UA Positive (A) Negative   Urobilinogen, UA 0.2 0.2 or 1.0 E.U./dL   Nitrite, UA negative    Leukocytes, UA Negative Negative   Appearance     Odor

## 2022-04-01 LAB — PSA, TOTAL AND FREE
PSA, Free Pct: 30.6 %
PSA, Free: 1.44 ng/mL
Prostate Specific Ag, Serum: 4.7 ng/mL — ABNORMAL HIGH (ref 0.0–4.0)

## 2022-04-02 ENCOUNTER — Telehealth: Payer: Self-pay

## 2022-04-02 NOTE — Telephone Encounter (Signed)
Patient aware of MD response to PSA and voiced understanding.

## 2022-04-02 NOTE — Telephone Encounter (Signed)
-----   Message from Milderd Meager, MD sent at 04/01/2022 12:18 PM EST ----- Please notify patient that his PSA remains stable and is consisted with benign enlargement of the prostate.

## 2022-09-20 ENCOUNTER — Ambulatory Visit: Payer: Medicare Other | Admitting: Urology
